# Patient Record
Sex: Female | Born: 1950 | Race: White | Hispanic: No | Marital: Single | Smoking: Never smoker
Health system: Southern US, Community
[De-identification: ages and names within clinical notes are randomized; demographics above are authoritative.]

## PROBLEM LIST (undated history)

## (undated) DIAGNOSIS — M169 Osteoarthritis of hip, unspecified: Secondary | ICD-10-CM

## (undated) DIAGNOSIS — Z96649 Presence of unspecified artificial hip joint: Secondary | ICD-10-CM

## (undated) DIAGNOSIS — R011 Cardiac murmur, unspecified: Secondary | ICD-10-CM

## (undated) DIAGNOSIS — E785 Hyperlipidemia, unspecified: Secondary | ICD-10-CM

## (undated) DIAGNOSIS — M8580 Other specified disorders of bone density and structure, unspecified site: Secondary | ICD-10-CM

## (undated) DIAGNOSIS — I10 Essential (primary) hypertension: Secondary | ICD-10-CM

## (undated) HISTORY — DX: Hyperlipidemia, unspecified: E78.5

## (undated) HISTORY — DX: Essential (primary) hypertension: I10

## (undated) HISTORY — PX: TONSILLECTOMY: SUR1361

## (undated) HISTORY — DX: Cardiac murmur, unspecified: R01.1

## (undated) HISTORY — DX: Osteoarthritis of hip, unspecified: M16.9

## (undated) HISTORY — DX: Presence of unspecified artificial hip joint: Z96.649

## (undated) HISTORY — DX: Other specified disorders of bone density and structure, unspecified site: M85.80

## (undated) HISTORY — PX: JOINT REPLACEMENT: SHX530

---

## 2014-04-22 ENCOUNTER — Ambulatory Visit (INDEPENDENT_AMBULATORY_CARE_PROVIDER_SITE_OTHER): Payer: BC Managed Care – PPO | Admitting: Family Medicine

## 2014-04-22 ENCOUNTER — Encounter: Payer: Self-pay | Admitting: Family Medicine

## 2014-04-22 VITALS — BP 149/88 | HR 95 | Ht 63.0 in | Wt 136.0 lb

## 2014-04-22 DIAGNOSIS — Z8679 Personal history of other diseases of the circulatory system: Secondary | ICD-10-CM | POA: Insufficient documentation

## 2014-04-22 DIAGNOSIS — Z8639 Personal history of other endocrine, nutritional and metabolic disease: Secondary | ICD-10-CM

## 2014-04-22 DIAGNOSIS — I10 Essential (primary) hypertension: Secondary | ICD-10-CM

## 2014-04-22 DIAGNOSIS — Z862 Personal history of diseases of the blood and blood-forming organs and certain disorders involving the immune mechanism: Secondary | ICD-10-CM

## 2014-04-22 DIAGNOSIS — E785 Hyperlipidemia, unspecified: Secondary | ICD-10-CM

## 2014-04-22 DIAGNOSIS — Z1231 Encounter for screening mammogram for malignant neoplasm of breast: Secondary | ICD-10-CM

## 2014-04-22 DIAGNOSIS — R030 Elevated blood-pressure reading, without diagnosis of hypertension: Secondary | ICD-10-CM

## 2014-04-22 DIAGNOSIS — R918 Other nonspecific abnormal finding of lung field: Secondary | ICD-10-CM

## 2014-04-22 DIAGNOSIS — IMO0001 Reserved for inherently not codable concepts without codable children: Secondary | ICD-10-CM

## 2014-04-22 HISTORY — DX: Essential (primary) hypertension: I10

## 2014-04-22 HISTORY — DX: Hyperlipidemia, unspecified: E78.5

## 2014-04-22 NOTE — Patient Instructions (Signed)
DASH Diet  The DASH diet stands for "Dietary Approaches to Stop Hypertension." It is a healthy eating plan that has been shown to reduce high blood pressure (hypertension) in as little as 14 days, while also possibly providing other significant health benefits. These other health benefits include reducing the risk of breast cancer after menopause and reducing the risk of type 2 diabetes, heart disease, colon cancer, and stroke. Health benefits also include weight loss and slowing kidney failure in patients with chronic kidney disease.   DIET GUIDELINES  · Limit salt (sodium). Your diet should contain less than 1500 mg of sodium daily.  · Limit refined or processed carbohydrates. Your diet should include mostly whole grains. Desserts and added sugars should be used sparingly.  · Include small amounts of heart-healthy fats. These types of fats include nuts, oils, and tub margarine. Limit saturated and trans fats. These fats have been shown to be harmful in the body.  CHOOSING FOODS   The following food groups are based on a 2000 calorie diet. See your Registered Dietitian for individual calorie needs.  Grains and Grain Products (6 to 8 servings daily)  · Eat More Often: Whole-wheat bread, brown rice, whole-grain or wheat pasta, quinoa, popcorn without added fat or salt (air popped).  · Eat Less Often: White bread, white pasta, white rice, cornbread.  Vegetables (4 to 5 servings daily)  · Eat More Often: Fresh, frozen, and canned vegetables. Vegetables may be raw, steamed, roasted, or grilled with a minimal amount of fat.  · Eat Less Often/Avoid: Creamed or fried vegetables. Vegetables in a cheese sauce.  Fruit (4 to 5 servings daily)  · Eat More Often: All fresh, canned (in natural juice), or frozen fruits. Dried fruits without added sugar. One hundred percent fruit juice (½ cup [237 mL] daily).  · Eat Less Often: Dried fruits with added sugar. Canned fruit in light or heavy syrup.  Lean Meats, Fish, and Poultry (2  servings or less daily. One serving is 3 to 4 oz [85-114 g]).  · Eat More Often: Ninety percent or leaner ground beef, tenderloin, sirloin. Round cuts of beef, chicken breast, turkey breast. All fish. Grill, bake, or broil your meat. Nothing should be fried.  · Eat Less Often/Avoid: Fatty cuts of meat, turkey, or chicken leg, thigh, or wing. Fried cuts of meat or fish.  Dairy (2 to 3 servings)  · Eat More Often: Low-fat or fat-free milk, low-fat plain or light yogurt, reduced-fat or part-skim cheese.  · Eat Less Often/Avoid: Milk (whole, 2%). Whole milk yogurt. Full-fat cheeses.  Nuts, Seeds, and Legumes (4 to 5 servings per week)  · Eat More Often: All without added salt.  · Eat Less Often/Avoid: Salted nuts and seeds, canned beans with added salt.  Fats and Sweets (limited)  · Eat More Often: Vegetable oils, tub margarines without trans fats, sugar-free gelatin. Mayonnaise and salad dressings.  · Eat Less Often/Avoid: Coconut oils, palm oils, butter, stick margarine, cream, half and half, cookies, candy, pie.  FOR MORE INFORMATION  The Dash Diet Eating Plan: www.dashdiet.org  Document Released: 12/02/2011 Document Revised: 03/06/2012 Document Reviewed: 12/02/2011  ExitCare® Patient Information ©2014 [employer].

## 2014-04-22 NOTE — Progress Notes (Signed)
Subjective:    Patient ID: Tanya Cortez, female    DOB: [birth year], 63 y.o.   MRN: [id number]  HPI Here to establish care. She has noticed her blood pressures have been elevated over the last month. She's not currently taking any prescription medications. Does have a history of slightly elevated cholesterol levels and would like to have this rechecked. Has been retaining fluid some in her lower extremities. Worse on the right ankle.  Worse at the end of the day. No CP or SOB or palpitations. She currently walks 2 miles per day for about 30-45 minutes.  It has been several years since she had blood work or Pap smear. She has never had colon cancer screening. Her last mammogram was several years ago. She really has not seen a doctor in several years.  Review of Systems  Constitutional: Negative for fever, diaphoresis and unexpected weight change.  HENT: Negative for hearing loss, rhinorrhea and tinnitus.   Eyes: Negative for visual disturbance.  Respiratory: Negative for cough and wheezing.   Cardiovascular: Negative for chest pain and palpitations.  Gastrointestinal: Negative for nausea, vomiting, diarrhea and blood in stool.  Genitourinary: Negative for vaginal bleeding, vaginal discharge and difficulty urinating.  Musculoskeletal: Negative for arthralgias and myalgias.  Skin: Negative for rash.  Neurological: Negative for headaches.  Hematological: Negative for adenopathy. Does not bruise/bleed easily.  Psychiatric/Behavioral: Negative for sleep disturbance and dysphoric mood. The patient is not nervous/anxious.     BP 149/88  Pulse 95  Ht 5\' 3"  (1.6 m)  Wt 136 lb (61.689 kg)  BMI 24.10 kg/m2    No Known Allergies  No past medical history on file.  No past surgical history on file.  History   Social History  . Marital Status: Single    Spouse Name: N/A    Number of Children: N/A  . Years of Education: N/A   Occupational History  . high school principal     [employer]    Social History Main Topics  . Smoking status: Never Smoker   . Smokeless tobacco: Not on file  . Alcohol Use: No  . Drug Use: No  . Sexual Activity: No   Other Topics Concern  . Not on file   Social History Narrative   Patient reports exercising regularly by doing 30 -45 mins daily of cardio.     Family History  Problem Relation Age of Onset  . Skin cancer Mother   . Heart attack Maternal Grandfather   . Hypertension Mother   . Stroke Maternal Grandfather   . Heart attack Father     died 64 yo  . Breast cancer Mother   . Hypertension Sister     No outpatient encounter prescriptions on file as of 04/22/2014.          Objective:   Physical Exam  Constitutional: She is oriented to person, place, and time. She appears well-developed and well-nourished.  HENT:  Head: Normocephalic and atraumatic.  Neck: Neck supple. No thyromegaly present.  Cardiovascular: Normal rate, regular rhythm and normal heart sounds.   No carotid bruits  Pulmonary/Chest: Effort normal and breath sounds normal. No respiratory distress. She has no wheezes.  Diffuse prolonged expiration.  Neurological: She is alert and oriented to person, place, and time.  Skin: Skin is warm and dry.  Psychiatric: She has a normal mood and affect. Her behavior is normal.          Assessment & Plan:  Elevated BP - BP is  borderline elevated today. Discussed blood pressure goals based on her age. I would like to see her back in a few weeks to schedule her physical. We can recheck it at that time and evaluate whether not we need to start on medication. In the meantime discussed DASH diet and handout provided. She feels like overall she does a fantastic job with her diet and exercise.  Prolonged expiration-she does have a family history COPD. She's never been a smoker and no significant secondhand exposure. I recommend consider evaluation with spirometry sometime this summer.  Discussed need for mammogram. Order  placed.  Due for CMP and fasting lipid panel. Went ahead and give her a lab slip to do this we will call with the results once available and can review in detail at her followup appointment for her physical. She's also due for a Pap smear at that time.

## 2014-04-25 LAB — CBC
HCT: 40.9 % (ref 36.0–46.0)
Hemoglobin: 14.7 g/dL (ref 12.0–15.0)
MCH: 31.1 pg (ref 26.0–34.0)
MCHC: 35.9 g/dL (ref 30.0–36.0)
MCV: 86.5 fL (ref 78.0–100.0)
PLATELETS: 231 10*3/uL (ref 150–400)
RBC: 4.73 MIL/uL (ref 3.87–5.11)
RDW: 12.7 % (ref 11.5–15.5)
WBC: 5.5 10*3/uL (ref 4.0–10.5)

## 2014-04-25 LAB — COMPLETE METABOLIC PANEL WITH GFR
ALBUMIN: 4.6 g/dL (ref 3.5–5.2)
ALK PHOS: 91 U/L (ref 39–117)
ALT: 8 U/L (ref 0–35)
AST: 17 U/L (ref 0–37)
BUN: 13 mg/dL (ref 6–23)
CO2: 31 mEq/L (ref 19–32)
Calcium: 9.7 mg/dL (ref 8.4–10.5)
Chloride: 104 mEq/L (ref 96–112)
Creat: 0.7 mg/dL (ref 0.50–1.10)
GFR, Est African American: >89 mL/min
GFR, Est Non African American: >89 mL/min
GLUCOSE: 106 mg/dL — AB (ref 70–99)
POTASSIUM: 4.7 meq/L (ref 3.5–5.3)
SODIUM: 143 meq/L (ref 135–145)
TOTAL PROTEIN: 6.6 g/dL (ref 6.0–8.3)
Total Bilirubin: 0.5 mg/dL (ref 0.2–1.2)

## 2014-04-25 LAB — LIPID PANEL
CHOL/HDL RATIO: 3.6 ratio
Cholesterol: 276 mg/dL — ABNORMAL HIGH (ref 0–200)
HDL: 77 mg/dL (ref >39)
LDL CALC: 187 mg/dL — AB (ref 0–99)
Triglycerides: 62 mg/dL (ref <150)
VLDL: 12 mg/dL (ref 0–40)

## 2014-04-26 LAB — TSH: TSH: 2.3 u[IU]/mL (ref 0.350–4.500)

## 2014-05-06 ENCOUNTER — Encounter: Payer: BC Managed Care – PPO | Admitting: Physician Assistant

## 2014-05-08 ENCOUNTER — Other Ambulatory Visit (HOSPITAL_COMMUNITY)
Admission: RE | Admit: 2014-05-08 | Discharge: 2014-05-08 | Disposition: A | Discharge status: Home / Self Care | Payer: BC Managed Care – PPO | Source: Ambulatory Visit | Attending: Family Medicine | Admitting: Family Medicine

## 2014-05-08 ENCOUNTER — Encounter: Payer: Self-pay | Admitting: Family Medicine

## 2014-05-08 ENCOUNTER — Ambulatory Visit (INDEPENDENT_AMBULATORY_CARE_PROVIDER_SITE_OTHER): Payer: BC Managed Care – PPO | Admitting: Family Medicine

## 2014-05-08 VITALS — BP 149/87 | HR 79 | Wt 135.0 lb

## 2014-05-08 DIAGNOSIS — Z23 Encounter for immunization: Secondary | ICD-10-CM

## 2014-05-08 DIAGNOSIS — Z0000 Encounter for general adult medical examination without abnormal findings: Secondary | ICD-10-CM

## 2014-05-08 DIAGNOSIS — Z124 Encounter for screening for malignant neoplasm of cervix: Secondary | ICD-10-CM

## 2014-05-08 DIAGNOSIS — Z1151 Encounter for screening for human papillomavirus (HPV): Secondary | ICD-10-CM | POA: Insufficient documentation

## 2014-05-08 DIAGNOSIS — R7309 Other abnormal glucose: Secondary | ICD-10-CM

## 2014-05-08 DIAGNOSIS — Z01419 Encounter for gynecological examination (general) (routine) without abnormal findings: Secondary | ICD-10-CM | POA: Insufficient documentation

## 2014-05-08 DIAGNOSIS — R011 Cardiac murmur, unspecified: Secondary | ICD-10-CM

## 2014-05-08 DIAGNOSIS — I10 Essential (primary) hypertension: Secondary | ICD-10-CM

## 2014-05-08 LAB — POCT GLYCOSYLATED HEMOGLOBIN (HGB A1C): Hemoglobin A1C: 5.5

## 2014-05-08 MED ORDER — ZOSTER VACCINE LIVE 19400 UNT/0.65ML ~~LOC~~ SOLR: 0.6500 mL | Freq: Once | SUBCUTANEOUS | Status: DC

## 2014-05-08 MED ORDER — LISINOPRIL 5 MG PO TABS: 5.0000 mg | ORAL_TABLET | Freq: Every day | ORAL | Status: DC

## 2014-05-08 NOTE — Progress Notes (Signed)
  Subjective:     Tanya Cortez is a 63 y.o. female and is here for a comprehensive physical exam. The patient reports no problems.  History   Social History  . Marital Status: Single    Spouse Name: N/A    Number of Children: N/A  . Years of Education: N/A   Occupational History  . high school principal     [employer]   Social History Main Topics  . Smoking status: Never Smoker   . Smokeless tobacco: Not on file  . Alcohol Use: No  . Drug Use: No  . Sexual Activity: No   Other Topics Concern  . Not on file   Social History Narrative   Patient reports exercising regularly by doing 30 -45 mins daily of cardio. [employer].  She has a degree in education specialty.  Lives with Tawanna CoolerKathryn Miller   Health Maintenance  Topic Date Due  . Pap Smear  01/12/1968  . Tetanus/tdap  01/11/1969  . Mammogram  01/12/2000  . Colonoscopy  01/12/2000  . Zostavax  01/11/2010  . Influenza Vaccine  07/27/2014    The following portions of the patient's history were reviewed and updated as appropriate: allergies, current medications, past family history, past medical history, past social history, past surgical history and problem list.  Review of Systems A comprehensive review of systems was negative.   Objective:    BP 149/87  Pulse 79  Wt 135 lb (61.236 kg) General appearance: alert, cooperative and appears stated age Head: Normocephalic, without obvious abnormality, atraumatic Eyes: conj clear, EOMI, PEERLA Ears: normal TM's and external ear canals both ears Nose: Nares normal. Septum midline. Mucosa normal. No drainage or sinus tenderness. Throat: lips, mucosa, and tongue normal; teeth and gums normal Neck: no adenopathy, no carotid bruit, no JVD, supple, symmetrical, trachea midline and thyroid not enlarged, symmetric, no tenderness/mass/nodules Back: symmetric, no curvature. ROM normal. No CVA tenderness. Lungs: clear to auscultation bilaterally Breasts: normal appearance,  no masses or tenderness Heart: systolic murmur: holosystolic 1/6, prolonged at lower left sternal border Abdomen: soft, non-tender; bowel sounds normal; no masses,  no organomegaly Pelvic: cervix normal in appearance, external genitalia normal, no adnexal masses or tenderness, no cervical motion tenderness, rectovaginal septum normal, uterus normal size, shape, and consistency, vagina normal without discharge and vaginal atrophy Extremities: extremities normal, atraumatic, no cyanosis or edema Pulses: 2+ and symmetric Skin: Skin color, texture, turgor normal. No rashes or lesions Lymph nodes: Cervical, supraclavicular, and axillary nodes normal. Neurologic: Alert and oriented X 3, normal strength and tone. Normal symmetric reflexes. Normal coordination and gait    Assessment:    Healthy female exam.      Plan:     See After Visit Summary for Counseling Recommendations  Keep up a regular exercise program and make sure you are eating a healthy diet Try to eat 4 servings of dairy a day, or if you are lactose intolerant take a calcium with vitamin D daily.  Your vaccines are up to date.   tdap given today Shingles vaccine given today.  Reviewed labs with her.   Abnormal glucose-hemoglobin A1c performed today. Lab Results  Component Value Date   HGBA1C 5.5 05/08/2014   Hypertension-we'll start lisinopril. She really eats very healthy and has a fantastic BMI. She exercises regularly. I think that her high blood pressure somewhat genetic. Will start medication. Discussed potential side effects of ACE inhibitors. Followup in 6-8 weeks.

## 2014-05-08 NOTE — Patient Instructions (Addendum)
We will call you in about a week that your Pap smear results. He did receive the tdap vaccine in the shingles vaccine today. If you have any questions about her blood work then please let us know.  Keep up a regular exercise program and make sure you are eating a healthy diet Try to eat 4 servings of dairy a day, or if you are lactose intolerant take a calcium with vitamin D daily.  Your vaccines are up to date.

## 2014-05-10 ENCOUNTER — Telehealth: Payer: Self-pay | Admitting: *Deleted

## 2014-05-10 NOTE — Progress Notes (Signed)
Quick Note:  Call patient: Your Pap smear is normal. Repeat in 3 years. ______ 

## 2014-05-10 NOTE — Telephone Encounter (Signed)
PA obtained for a resting 2d echo.  Auth # [id number].  Meyer CoryMisty Ahmad, LPN

## 2014-05-22 ENCOUNTER — Ambulatory Visit (HOSPITAL_BASED_OUTPATIENT_CLINIC_OR_DEPARTMENT_OTHER)
Admission: RE | Admit: 2014-05-22 | Discharge: 2014-05-22 | Disposition: A | Discharge status: Home / Self Care | Payer: BC Managed Care – PPO | Source: Ambulatory Visit | Attending: Family Medicine | Admitting: Family Medicine

## 2014-05-22 DIAGNOSIS — R7309 Other abnormal glucose: Secondary | ICD-10-CM

## 2014-05-22 DIAGNOSIS — R011 Cardiac murmur, unspecified: Secondary | ICD-10-CM | POA: Insufficient documentation

## 2014-05-22 DIAGNOSIS — I10 Essential (primary) hypertension: Secondary | ICD-10-CM | POA: Insufficient documentation

## 2014-05-22 DIAGNOSIS — Z23 Encounter for immunization: Secondary | ICD-10-CM

## 2014-05-22 DIAGNOSIS — E785 Hyperlipidemia, unspecified: Secondary | ICD-10-CM | POA: Insufficient documentation

## 2014-05-22 DIAGNOSIS — Z0000 Encounter for general adult medical examination without abnormal findings: Secondary | ICD-10-CM

## 2014-05-22 DIAGNOSIS — Z124 Encounter for screening for malignant neoplasm of cervix: Secondary | ICD-10-CM

## 2014-05-22 NOTE — Progress Notes (Signed)
  Echocardiogram 2D Echocardiogram has been performed.  Tanya Cortez 05/22/2014, 9:10 AM

## 2014-05-24 ENCOUNTER — Encounter: Payer: Self-pay | Admitting: Family Medicine

## 2014-05-24 DIAGNOSIS — R011 Cardiac murmur, unspecified: Secondary | ICD-10-CM | POA: Insufficient documentation

## 2014-06-19 ENCOUNTER — Ambulatory Visit (INDEPENDENT_AMBULATORY_CARE_PROVIDER_SITE_OTHER): Payer: BC Managed Care – PPO | Admitting: Family Medicine

## 2014-06-19 ENCOUNTER — Encounter: Payer: Self-pay | Admitting: Family Medicine

## 2014-06-19 VITALS — BP 118/82 | HR 74 | Wt 134.0 lb

## 2014-06-19 DIAGNOSIS — Z1211 Encounter for screening for malignant neoplasm of colon: Secondary | ICD-10-CM

## 2014-06-19 DIAGNOSIS — I10 Essential (primary) hypertension: Secondary | ICD-10-CM

## 2014-06-19 NOTE — Progress Notes (Signed)
   Subjective:    Patient ID: Tanya Cortez, female    DOB: 06/29/1951, 63 y.o.   MRN: [id number]  HPI Hypertension- Pt denies chest pain, SOB, dizziness, or heart palpitations.  Taking meds as directed w/o problems.  Denies medication side effects.  We started lisinopril the last office visit. She's not had any side effects with the new medication.  Review of Systems     Objective:   Physical Exam  Constitutional: She is oriented to person, place, and time. She appears well-developed and well-nourished.  HENT:  Head: Normocephalic and atraumatic.  Cardiovascular: Normal rate, regular rhythm and normal heart sounds.   Pulmonary/Chest: Effort normal and breath sounds normal.  Neurological: She is alert and oriented to person, place, and time.  Skin: Skin is warm and dry.  Psychiatric: She has a normal mood and affect. Her behavior is normal.          Assessment & Plan:  HTN - Well controlled on new regimen without any side effects of the medication. Check BMP today. Followup in 6 months.  Due for screening colonoscopy. Will place referral today.    Unfortunately she never received a phone call about her mammogram. We call today to get her an appointment in July.

## 2014-06-20 LAB — BASIC METABOLIC PANEL WITH GFR
BUN: 13 mg/dL (ref 6–23)
CHLORIDE: 100 meq/L (ref 96–112)
CO2: 31 mEq/L (ref 19–32)
Calcium: 9.7 mg/dL (ref 8.4–10.5)
Creat: 0.74 mg/dL (ref 0.50–1.10)
GFR, EST NON AFRICAN AMERICAN: 86 mL/min
GFR, Est African American: >89 mL/min
GLUCOSE: 78 mg/dL (ref 70–99)
Potassium: 4.1 mEq/L (ref 3.5–5.3)
Sodium: 138 mEq/L (ref 135–145)

## 2014-06-20 NOTE — Progress Notes (Signed)
Quick Note:  All labs are normal. ______ 

## 2014-07-02 ENCOUNTER — Ambulatory Visit: Payer: BC Managed Care – PPO

## 2014-07-16 ENCOUNTER — Ambulatory Visit (INDEPENDENT_AMBULATORY_CARE_PROVIDER_SITE_OTHER): Payer: BC Managed Care – PPO

## 2014-07-16 DIAGNOSIS — Z1231 Encounter for screening mammogram for malignant neoplasm of breast: Secondary | ICD-10-CM

## 2014-12-17 ENCOUNTER — Ambulatory Visit (INDEPENDENT_AMBULATORY_CARE_PROVIDER_SITE_OTHER): Payer: BC Managed Care – PPO | Admitting: Family Medicine

## 2014-12-17 ENCOUNTER — Encounter: Payer: Self-pay | Admitting: Family Medicine

## 2014-12-17 VITALS — BP 118/78 | HR 73 | Wt 136.0 lb

## 2014-12-17 DIAGNOSIS — Z23 Encounter for immunization: Secondary | ICD-10-CM | POA: Diagnosis not present

## 2014-12-17 DIAGNOSIS — I10 Essential (primary) hypertension: Secondary | ICD-10-CM | POA: Diagnosis not present

## 2014-12-17 NOTE — Progress Notes (Signed)
   Subjective:    Patient ID: Tanya Cortez, female    DOB: [birth year], 63 y.o.   MRN: [id number]  HPI  Hypertension- Pt denies chest pain, SOB, dizziness, or heart palpitations.  Taking meds as directed w/o problems.  Denies medication side effects.     Review of Systems     Objective:   Physical Exam  Constitutional: She is oriented to person, place, and time. She appears well-developed and well-nourished.  HENT:  Head: Normocephalic and atraumatic.  Cardiovascular: Normal rate, regular rhythm and normal heart sounds.   Pulmonary/Chest: Effort normal and breath sounds normal.  Neurological: She is alert and oriented to person, place, and time.  Skin: Skin is warm and dry.  Psychiatric: She has a normal mood and affect. Her behavior is normal.          Assessment & Plan:  HTN- well controlled. Due for lipoids in April. Continue current regimen. Follow up in 6 months.   She is due for screening colonoscopy but switch jobs. She will have her new insurance kick in soon. So she will call me after the new year so that we can schedule her.

## 2014-12-18 ENCOUNTER — Ambulatory Visit: Payer: BC Managed Care – PPO | Admitting: Family Medicine

## 2015-05-10 ENCOUNTER — Other Ambulatory Visit: Payer: Self-pay | Admitting: Family Medicine

## 2015-06-12 ENCOUNTER — Encounter: Payer: Self-pay | Admitting: Family Medicine

## 2015-06-12 ENCOUNTER — Ambulatory Visit (INDEPENDENT_AMBULATORY_CARE_PROVIDER_SITE_OTHER): Payer: BC Managed Care – PPO | Admitting: Family Medicine

## 2015-06-12 VITALS — BP 130/82 | HR 67 | Ht 63.0 in | Wt 132.0 lb

## 2015-06-12 DIAGNOSIS — M25561 Pain in right knee: Secondary | ICD-10-CM

## 2015-06-12 DIAGNOSIS — I10 Essential (primary) hypertension: Secondary | ICD-10-CM

## 2015-06-12 DIAGNOSIS — Z23 Encounter for immunization: Secondary | ICD-10-CM | POA: Diagnosis not present

## 2015-06-12 DIAGNOSIS — M2550 Pain in unspecified joint: Secondary | ICD-10-CM

## 2015-06-12 DIAGNOSIS — Z8639 Personal history of other endocrine, nutritional and metabolic disease: Secondary | ICD-10-CM

## 2015-06-12 DIAGNOSIS — M25552 Pain in left hip: Secondary | ICD-10-CM

## 2015-06-12 DIAGNOSIS — M25551 Pain in right hip: Secondary | ICD-10-CM

## 2015-06-12 DIAGNOSIS — M25562 Pain in left knee: Secondary | ICD-10-CM

## 2015-06-12 LAB — COMPLETE METABOLIC PANEL WITH GFR
ALK PHOS: 95 U/L (ref 39–117)
ALT: 9 U/L (ref 0–35)
AST: 20 U/L (ref 0–37)
Albumin: 4.7 g/dL (ref 3.5–5.2)
BUN: 14 mg/dL (ref 6–23)
CALCIUM: 9.5 mg/dL (ref 8.4–10.5)
CHLORIDE: 103 meq/L (ref 96–112)
CO2: 30 mEq/L (ref 19–32)
Creat: 0.72 mg/dL (ref 0.50–1.10)
GFR, EST NON AFRICAN AMERICAN: 88 mL/min
GFR, Est African American: >89 mL/min
GLUCOSE: 86 mg/dL (ref 70–99)
POTASSIUM: 4.7 meq/L (ref 3.5–5.3)
Sodium: 143 mEq/L (ref 135–145)
Total Bilirubin: 0.5 mg/dL (ref 0.2–1.2)
Total Protein: 6.9 g/dL (ref 6.0–8.3)

## 2015-06-12 LAB — LIPID PANEL
Cholesterol: 279 mg/dL — ABNORMAL HIGH (ref 0–200)
HDL: 94 mg/dL (ref >=46)
LDL Cholesterol: 174 mg/dL — ABNORMAL HIGH (ref 0–99)
TRIGLYCERIDES: 56 mg/dL (ref <150)
Total CHOL/HDL Ratio: 3 Ratio
VLDL: 11 mg/dL (ref 0–40)

## 2015-06-12 LAB — C-REACTIVE PROTEIN: CRP: <0.5 mg/dL (ref <0.60)

## 2015-06-12 MED ORDER — LOSARTAN POTASSIUM 25 MG PO TABS: 25.0000 mg | ORAL_TABLET | Freq: Every day | ORAL | Status: DC

## 2015-06-12 NOTE — Progress Notes (Signed)
   Subjective:    Patient ID: Tanya Cortez, female    DOB: [birth year], 64 y.o.   MRN: [id number]  HPI   Hypertension- Pt denies chest pain, SOB, dizziness, or heart palpitations.  Taking meds as directed w/o problems.  Denies medication side effects. Regular exercise.     Having knee and hip pain for several months.  Says exercise can make it worse at times.  No pain in her hands.  No no swelling or redness of the joints. No family history of rheumatoid. Left shoulder has been popping but not painful. occ sore when lifts her left shoulder.  painin knees and hips is more noticeable when stands up after has been sitting for awhile and then gets better as she moves.  The pain in the hips is a little bit posterior toward the back area just lateral to the trochanteric bursa. Knee pain is just generalized.   Review of Systems     Objective:   Physical Exam  Constitutional: She is oriented to person, place, and time. She appears well-developed and well-nourished.  HENT:  Head: Normocephalic and atraumatic.  Cardiovascular: Normal rate, regular rhythm and normal heart sounds.   Pulmonary/Chest: Effort normal and breath sounds normal.  Musculoskeletal:  Hips with normal range of motion. Hip, knee, ankle strength is 5 out of 5 bilaterally. No pain at the hips with internal or external rotation. Non-tender over the trochanteric bursa as. Knees with normal range of motion. No significant crepitus. Nontender along the medial or lateral joint lines. No swelling or edema.  Neurological: She is alert and oriented to person, place, and time.  Skin: Skin is warm and dry.  Psychiatric: She has a normal mood and affect. Her behavior is normal.          Assessment & Plan:  HTN - well controlled. Continue current regimen. Follow-up in 6 months.  Bilateral hip/bilateral knee pain-Based on her location of pain is unlikely to be rheumatoid but she would like to be tested with blood work. I did go ahead  and order those labs today. Discussed it may just be more osteoarthritis. Based on her knee symptoms it sounds a little bit like patellofemoral syndrome/chondromalacia patella. Handout given on stretches and exercises to do on her own at home. Hip exam was completely benign today so I'm not clear what might be causing that it could be radiating from her back.  Discussed need for colonscopy.    Prevnar 13 given today.

## 2015-06-13 LAB — SEDIMENTATION RATE: Sed Rate: 5 mm/hr (ref 0–30)

## 2015-06-13 LAB — CYCLIC CITRUL PEPTIDE ANTIBODY, IGG: Cyclic Citrullin Peptide Ab: <2 U/mL (ref 0.0–5.0)

## 2015-06-13 LAB — ANA: ANA: NEGATIVE

## 2015-06-18 ENCOUNTER — Ambulatory Visit: Payer: BC Managed Care – PPO | Admitting: Family Medicine

## 2015-07-17 ENCOUNTER — Other Ambulatory Visit: Payer: Self-pay | Admitting: Family Medicine

## 2015-07-17 DIAGNOSIS — Z1231 Encounter for screening mammogram for malignant neoplasm of breast: Secondary | ICD-10-CM

## 2015-07-31 ENCOUNTER — Ambulatory Visit (INDEPENDENT_AMBULATORY_CARE_PROVIDER_SITE_OTHER): Payer: BC Managed Care – PPO

## 2015-07-31 DIAGNOSIS — Z1231 Encounter for screening mammogram for malignant neoplasm of breast: Secondary | ICD-10-CM

## 2015-12-12 ENCOUNTER — Ambulatory Visit: Payer: BC Managed Care – PPO | Admitting: Family Medicine

## 2015-12-15 ENCOUNTER — Ambulatory Visit (INDEPENDENT_AMBULATORY_CARE_PROVIDER_SITE_OTHER): Payer: BC Managed Care – PPO | Admitting: Family Medicine

## 2015-12-15 ENCOUNTER — Encounter: Payer: Self-pay | Admitting: Family Medicine

## 2015-12-15 VITALS — BP 134/68 | HR 72 | Wt 133.0 lb

## 2015-12-15 DIAGNOSIS — I10 Essential (primary) hypertension: Secondary | ICD-10-CM | POA: Diagnosis not present

## 2015-12-15 DIAGNOSIS — Z1211 Encounter for screening for malignant neoplasm of colon: Secondary | ICD-10-CM

## 2015-12-15 DIAGNOSIS — Z23 Encounter for immunization: Secondary | ICD-10-CM | POA: Diagnosis not present

## 2015-12-15 MED ORDER — LOSARTAN POTASSIUM 25 MG PO TABS: 25.0000 mg | ORAL_TABLET | Freq: Every day | ORAL | Status: DC

## 2015-12-15 NOTE — Progress Notes (Signed)
   Subjective:    Patient ID: Tanya Cortez, female    DOB: [birth year], 64 y.o.   MRN: [id number]  HPI Hypertension- Pt denies chest pain, SOB, dizziness, or heart palpitations.  Taking meds as directed w/o problems.  Denies medication side effects.    Colon cancer Screening.  Wants to discuss options.   Review of Systems     Objective:   Physical Exam  Constitutional: She is oriented to person, place, and time. She appears well-developed and well-nourished.  HENT:  Head: Normocephalic and atraumatic.  Cardiovascular: Normal rate, regular rhythm and normal heart sounds.   Pulmonary/Chest: Effort normal and breath sounds normal.  Neurological: She is alert and oriented to person, place, and time.  Skin: Skin is warm and dry.  Psychiatric: She has a normal mood and affect. Her behavior is normal.          Assessment & Plan:  HTN - well controlled. F/U oin 6 months. Due for BMP.   Colon cancer screen - discussed cologuard.  Given information. Will fax form to complete. Prefers to wait until January 1.

## 2015-12-16 LAB — BASIC METABOLIC PANEL WITH GFR
BUN: 16 mg/dL (ref 7–25)
CHLORIDE: 104 mmol/L (ref 98–110)
CO2: 29 mmol/L (ref 20–31)
Calcium: 9.6 mg/dL (ref 8.6–10.4)
Creat: 0.82 mg/dL (ref 0.50–0.99)
GFR, EST NON AFRICAN AMERICAN: 75 mL/min (ref >=60)
GFR, Est African American: 87 mL/min (ref >=60)
Glucose, Bld: 84 mg/dL (ref 65–99)
POTASSIUM: 4.9 mmol/L (ref 3.5–5.3)
SODIUM: 143 mmol/L (ref 135–146)

## 2015-12-16 NOTE — Progress Notes (Signed)
Quick Note:  All labs are normal. ______ 

## 2016-01-05 LAB — COLOGUARD: Cologuard: NEGATIVE

## 2016-01-14 ENCOUNTER — Telehealth: Payer: Self-pay | Admitting: Family Medicine

## 2016-01-14 ENCOUNTER — Other Ambulatory Visit: Payer: Self-pay | Admitting: Family Medicine

## 2016-01-14 NOTE — Telephone Encounter (Signed)
Call patient: Cologuard is negative. Repeat colon cancer screening recommended in 3 years.

## 2016-01-14 NOTE — Telephone Encounter (Signed)
Left information on Pt's VM, callback provided for any questions.  

## 2016-02-24 ENCOUNTER — Ambulatory Visit (INDEPENDENT_AMBULATORY_CARE_PROVIDER_SITE_OTHER): Payer: BC Managed Care – PPO | Admitting: Family Medicine

## 2016-02-24 ENCOUNTER — Encounter: Payer: Self-pay | Admitting: Family Medicine

## 2016-02-24 VITALS — BP 109/94 | HR 92 | Temp 98.9°F | Wt 135.0 lb

## 2016-02-24 DIAGNOSIS — J069 Acute upper respiratory infection, unspecified: Secondary | ICD-10-CM | POA: Diagnosis not present

## 2016-02-24 DIAGNOSIS — B9789 Other viral agents as the cause of diseases classified elsewhere: Principal | ICD-10-CM

## 2016-02-24 MED ORDER — IPRATROPIUM BROMIDE 0.06 % NA SOLN: 2.0000 | NASAL | Status: DC | PRN

## 2016-02-24 MED ORDER — BENZONATATE 200 MG PO CAPS: 200.0000 mg | ORAL_CAPSULE | Freq: Three times a day (TID) | ORAL | Status: DC | PRN

## 2016-02-24 NOTE — Patient Instructions (Signed)
Thank you for coming in today. Call or go to the emergency room if you get worse, have trouble breathing, have chest pains, or palpitations.   Cough, Adult Coughing is a reflex that clears your throat and your airways. Coughing helps to heal and protect your lungs. It is normal to cough occasionally, but a cough that happens with other symptoms or lasts a long time may be a sign of a condition that needs treatment. A cough may last only 2-3 weeks (acute), or it may last longer than 8 weeks (chronic). CAUSES Coughing is commonly caused by:  Breathing in substances that irritate your lungs.  A viral or bacterial respiratory infection.  Allergies.  Asthma.  Postnasal drip.  Smoking.  Acid backing up from the stomach into the esophagus (gastroesophageal reflux).  Certain medicines.  Chronic lung problems, including COPD (or rarely, lung cancer).  Other medical conditions such as heart failure. HOME CARE INSTRUCTIONS  Pay attention to any changes in your symptoms. Take these actions to help with your discomfort:  Take medicines only as told by your health care provider.  If you were prescribed an antibiotic medicine, take it as told by your health care provider. Do not stop taking the antibiotic even if you start to feel better.  Talk with your health care provider before you take a cough suppressant medicine.  Drink enough fluid to keep your urine clear or pale yellow.  If the air is dry, use a cold steam vaporizer or humidifier in your bedroom or your home to help loosen secretions.  Avoid anything that causes you to cough at work or at home.  If your cough is worse at night, try sleeping in a semi-upright position.  Avoid cigarette smoke. If you smoke, quit smoking. If you need help quitting, ask your health care provider.  Avoid caffeine.  Avoid alcohol.  Rest as needed. SEEK MEDICAL CARE IF:   You have new symptoms.  You cough up pus.  Your cough does not get  better after 2-3 weeks, or your cough gets worse.  You cannot control your cough with suppressant medicines and you are losing sleep.  You develop pain that is getting worse or pain that is not controlled with pain medicines.  You have a fever.  You have unexplained weight loss.  You have night sweats. SEEK IMMEDIATE MEDICAL CARE IF:  You cough up blood.  You have difficulty breathing.  Your heartbeat is very fast.   This information is not intended to replace advice given to you by your health care provider. Make sure you discuss any questions you have with your health care provider.   Document Released: 06/11/2011 Document Revised: 09/03/2015 Document Reviewed: 02/19/2015 Elsevier Interactive Patient Education 2016 Elsevier Inc.  

## 2016-02-24 NOTE — Assessment & Plan Note (Signed)
Viral URI. Treatment with Tylenol Tessalon and Atrovent nasal spray. Follow-up with PCP.

## 2016-02-24 NOTE — Progress Notes (Signed)
       Tanya Cortez is a 65 y.o. female who presents to [facility] today for cough congestion and runny nose. Symptoms present for 2 days. Patient notes a mild fever to 100.54F. She has tried Tylenol which does help. The cough is nonproductive. She denies any wheezing or chest tightness. 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 No vomiting diarrhea chest pain palpitations. Cough does not interfere with sleep.   Patient declines testing for bone density at this time preferring to wait until her next appointment with her PCP.  No past medical history on file. Past Surgical History  Procedure Laterality Date  . Tonsillectomy     Social History  Substance Use Topics  . Smoking status: Never Smoker   . Smokeless tobacco: Not on file  . Alcohol Use: No   family history includes Breast cancer in her mother; COPD in her mother; Heart attack in her father and maternal grandfather; Hypertension in her mother and sister; Skin cancer in her mother; Stroke in her maternal grandfather.  ROS as above Medications: Current Outpatient Prescriptions  Medication Sig Dispense Refill  . losartan (COZAAR) 25 MG tablet Take 1 tablet (25 mg total) by mouth daily. 90 tablet 1  . benzonatate (TESSALON) 200 MG capsule Take 1 capsule (200 mg total) by mouth 3 (three) times daily as needed for cough. 45 capsule 0  . ipratropium (ATROVENT) 0.06 % nasal spray Place 2 sprays into both nostrils every 4 (four) hours as needed for rhinitis. 10 mL 6   No current facility-administered medications for this visit.   No Known Allergies   Exam:  BP 109/94 mmHg  Pulse 92  Temp(Src) 98.9 F (37.2 C)  Wt 135 lb (61.236 kg)  SpO2 100% Gen: Well NAD nontoxic appearing HEENT: EOMI,  MMM clear nasal discharge. Normal tympanic membranes bilaterally. Posterior pharynx with  cobblestoning. Lungs: Normal work of breathing. CTABL Heart: RRR no MRG Abd: NABS, Soft. Nondistended, Nontender Exts: Brisk capillary refill, warm and well perfused.   No results found for this or any previous visit (from the past 24 hour(s)). No results found.   Please see individual assessment and plan sections.

## 2016-02-26 ENCOUNTER — Telehealth: Payer: Self-pay

## 2016-02-26 ENCOUNTER — Telehealth: Payer: Self-pay | Admitting: Family Medicine

## 2016-02-26 MED ORDER — LEVOFLOXACIN 500 MG PO TABS: 500.0000 mg | ORAL_TABLET | Freq: Every day | ORAL | Status: DC

## 2016-02-26 NOTE — Telephone Encounter (Signed)
Tanya Cortez now has a productive cough with brown sputum. She continues to have fevers up to 101.6. She also complains of congestion and sore throat. Please advise.

## 2016-02-26 NOTE — Telephone Encounter (Signed)
This is concerning for pneumonia. I called in levaquin antibiotic. Return to clinic tomorrow or sooner if needed.

## 2016-02-26 NOTE — Telephone Encounter (Signed)
Pt called.She said nobody had returned her call. I told her that Dr had already called in an antibiotic for her.

## 2016-02-26 NOTE — Telephone Encounter (Signed)
Patient advised and scheduled.  

## 2016-02-27 ENCOUNTER — Ambulatory Visit (INDEPENDENT_AMBULATORY_CARE_PROVIDER_SITE_OTHER): Payer: BC Managed Care – PPO

## 2016-02-27 ENCOUNTER — Encounter: Payer: Self-pay | Admitting: Family Medicine

## 2016-02-27 ENCOUNTER — Ambulatory Visit (INDEPENDENT_AMBULATORY_CARE_PROVIDER_SITE_OTHER): Payer: BC Managed Care – PPO | Admitting: Family Medicine

## 2016-02-27 VITALS — BP 100/61 | HR 82 | Temp 98.0°F | Ht 63.0 in | Wt 132.0 lb

## 2016-02-27 DIAGNOSIS — R05 Cough: Secondary | ICD-10-CM | POA: Diagnosis not present

## 2016-02-27 DIAGNOSIS — R509 Fever, unspecified: Secondary | ICD-10-CM

## 2016-02-27 DIAGNOSIS — J189 Pneumonia, unspecified organism: Secondary | ICD-10-CM | POA: Diagnosis not present

## 2016-02-27 LAB — CBC
HEMATOCRIT: 41.9 % (ref 36.0–46.0)
Hemoglobin: 14.4 g/dL (ref 12.0–15.0)
MCH: 30.7 pg (ref 26.0–34.0)
MCHC: 34.4 g/dL (ref 30.0–36.0)
MCV: 89.3 fL (ref 78.0–100.0)
MPV: 11.3 fL (ref 8.6–12.4)
PLATELETS: 215 10*3/uL (ref 150–400)
RBC: 4.69 MIL/uL (ref 3.87–5.11)
RDW: 12.9 % (ref 11.5–15.5)
WBC: 6.7 10*3/uL (ref 4.0–10.5)

## 2016-02-27 LAB — COMPREHENSIVE METABOLIC PANEL
ALT: 15 U/L (ref 6–29)
AST: 30 U/L (ref 10–35)
Albumin: 4 g/dL (ref 3.6–5.1)
Alkaline Phosphatase: 110 U/L (ref 33–130)
BUN: 16 mg/dL (ref 7–25)
CALCIUM: 9.1 mg/dL (ref 8.6–10.4)
CO2: 27 mmol/L (ref 20–31)
CREATININE: 0.96 mg/dL (ref 0.50–0.99)
Chloride: 96 mmol/L — ABNORMAL LOW (ref 98–110)
GLUCOSE: 151 mg/dL — AB (ref 65–99)
Potassium: 4.2 mmol/L (ref 3.5–5.3)
SODIUM: 137 mmol/L (ref 135–146)
Total Bilirubin: 0.4 mg/dL (ref 0.2–1.2)
Total Protein: 6.5 g/dL (ref 6.1–8.1)

## 2016-02-27 NOTE — Patient Instructions (Addendum)
It looks like you have pneumonia.  We are waiting on the radiologist to read the xray.  Continue Levaquin.  Return Monday if not better. Go to the ER or urgent care if worse.  Call or go to the emergency room if you get worse, have trouble breathing, have chest pains, or palpitations.  You will need repeat xray in 4 weeks.   Community-Acquired Pneumonia, Adult Pneumonia is an infection of the lungs. There are different types of pneumonia. One type can develop while a person is in a hospital. A different type, called community-acquired pneumonia, develops in people who are not, or have not recently been, in the hospital or other health care facility.  CAUSES Pneumonia may be caused by bacteria, viruses, or funguses. Community-acquired pneumonia is often caused by Streptococcus pneumonia bacteria. These bacteria are often passed from one person to another by breathing in droplets from the cough or sneeze of an infected person. RISK FACTORS The condition is more likely to develop in:  People who havechronic diseases, such as chronic obstructive pulmonary disease (COPD), asthma, congestive heart failure, cystic fibrosis, diabetes, or kidney disease.  People who haveearly-stage or late-stage HIV.  People who havesickle cell disease.  People who havehad their spleen removed (splenectomy).  People who havepoor Administrator.  People who havemedical conditions that increase the risk of breathing in (aspirating) secretions their own mouth and nose.   People who havea weakened immune system (immunocompromised).  People who smoke.  People whotravel to areas where pneumonia-causing germs commonly exist.  People whoare around animal habitats or animals that have pneumonia-causing germs, including birds, bats, rabbits, cats, and farm animals. SYMPTOMS Symptoms of this condition include:  Adry cough.  A wet (productive) cough.  Fever.  Sweating.  Chest pain, especially when  breathing deeply or coughing.  Rapid breathing or difficulty breathing.  Shortness of breath.  Shaking chills.  Fatigue.  Muscle aches. DIAGNOSIS Your health care provider will take a medical history and perform a physical exam. You may also have other tests, including:  Imaging studies of your chest, including X-rays.  Tests to check your blood oxygen level and other blood gases.  Other tests on blood, mucus (sputum), fluid around your lungs (pleural fluid), and urine. If your pneumonia is severe, other tests may be done to identify the specific cause of your illness. TREATMENT The type of treatment that you receive depends on many factors, such as the cause of your pneumonia, the medicines you take, and other medical conditions that you have. For most adults, treatment and recovery from pneumonia may occur at home. In some cases, treatment must happen in a hospital. Treatment may include:  Antibiotic medicines, if the pneumonia was caused by bacteria.  Antiviral medicines, if the pneumonia was caused by a virus.  Medicines that are given by mouth or through an IV tube.  Oxygen.  Respiratory therapy. Although rare, treating severe pneumonia may include:  Mechanical ventilation. This is done if you are not breathing well on your own and you cannot maintain a safe blood oxygen level.  Thoracentesis. This procedureremoves fluid around one lung or both lungs to help you breathe better. HOME CARE INSTRUCTIONS  Take over-the-counter and prescription medicines only as told by your health care provider.  Only takecough medicine if you are losing sleep. Understand that cough medicine can prevent your body's natural ability to remove mucus from your lungs.  If you were prescribed an antibiotic medicine, take it as told by your health care  provider. Do not stop taking the antibiotic even if you start to feel better.  Sleep in a semi-upright position at night. Try sleeping in a  reclining chair, or place a few pillows under your head.  Do not use tobacco products, including cigarettes, chewing tobacco, and e-cigarettes. If you need help quitting, ask your health care provider.  Drink enough water to keep your urine clear or pale yellow. This will help to thin out mucus secretions in your lungs. PREVENTION There are ways that you can decrease your risk of developing community-acquired pneumonia. Consider getting a pneumococcal vaccine if:  You are older than 66 years of age.  You are older than 66 years of age and are undergoing cancer treatment, have chronic lung disease, or have other medical conditions that affect your immune system. Ask your health care provider if this applies to you. There are different types and schedules of pneumococcal vaccines. Ask your health care provider which vaccination option is best for you. You may also prevent community-acquired pneumonia if you take these actions:  Get an influenza vaccine every year. Ask your health care provider which type of influenza vaccine is best for you.  Go to the dentist on a regular basis.  Wash your hands often. Use hand sanitizer if soap and water are not available. SEEK MEDICAL CARE IF:  You have a fever.  You are losing sleep because you cannot control your cough with cough medicine. SEEK IMMEDIATE MEDICAL CARE IF:  You have worsening shortness of breath.  You have increased chest pain.  Your sickness becomes worse, especially if you are an older adult or have a weakened immune system.  You cough up blood.   This information is not intended to replace advice given to you by your health care provider. Make sure you discuss any questions you have with your health care provider.   Document Released: 12/13/2005 Document Revised: 09/03/2015 Document Reviewed: 04/09/2015 Elsevier Interactive Patient Education [employer]2016 [employer] Inc.

## 2016-02-27 NOTE — Progress Notes (Signed)
       Tanya Cortez is a 65 y.o. female [facility] today for cough and congestion. Patient was seen on the 28th for cough for she was thought to have a viral URI. She was treated with Tessalon Perles Atrovent nasal spray. She notes she worsened and developed a fever and a productive cough read she called yesterday complaining of the symptoms. Levaquin was sent to her pharmacy and she was a feisty follow-up today for recheck and reevaluation. She took the levaquin yesterday and is already feeling better.    No past medical history on file. Past Surgical History  Procedure Laterality Date  . Tonsillectomy     Social History  Substance Use Topics  . Smoking status: Never Smoker   . Smokeless tobacco: Not on file  . Alcohol Use: No   family history includes Breast cancer in her mother; COPD in her mother; Heart attack in her father and maternal grandfather; Hypertension in her mother and sister; Skin cancer in her mother; Stroke in her maternal grandfather.  ROS as above Medications: Current Outpatient Prescriptions  Medication Sig Dispense Refill  . benzonatate (TESSALON) 200 MG capsule Take 1 capsule (200 mg total) by mouth 3 (three) times daily as needed for cough. 45 capsule 0  . ipratropium (ATROVENT) 0.06 % nasal spray Place 2 sprays into both nostrils every 4 (four) hours as needed for rhinitis. 10 mL 6  . levofloxacin (LEVAQUIN) 500 MG tablet Take 1 tablet (500 mg total) by mouth daily. 7 tablet 0  . losartan (COZAAR) 25 MG tablet Take 1 tablet (25 mg total) by mouth daily. 90 tablet 1   No current facility-administered medications for this visit.   No Known Allergies   Exam:  BP 100/61 mmHg  Pulse 82  Temp(Src) 98 F (36.7 C) (Oral)  Ht 5\' 3"  (1.6 m)  Wt 132 lb (59.875 kg)  BMI 23.39 kg/m2  SpO2 92% Gen: Well NAD HEENT: EOMI,  MMM Lungs: Normal work  of breathing. Course breath sounds bilaterally with rales left lower lung field.  Heart: RRR no MRG Abd: NABS, Soft. Nondistended, Nontender Exts: Brisk capillary refill, warm and well perfused.   No results found for this or any previous visit (from the past 24 hour(s)). No results found.   Please see individual assessment and plan sections.

## 2016-02-27 NOTE — Assessment & Plan Note (Signed)
Community-acquired pneumonia. Patient is currently taking Levaquin. Obtain CBC and CMP. Follow up Monday if not improving. Recheck x-ray in 4 weeks.

## 2016-03-01 ENCOUNTER — Encounter: Payer: Self-pay | Admitting: Family Medicine

## 2016-03-01 NOTE — Progress Notes (Signed)
Quick Note:  Labs show elevated sugar levels in the blood likely as a result of the pneumonia. This should be fine. ______

## 2016-03-25 ENCOUNTER — Ambulatory Visit (INDEPENDENT_AMBULATORY_CARE_PROVIDER_SITE_OTHER): Payer: BC Managed Care – PPO | Admitting: Family Medicine

## 2016-03-25 ENCOUNTER — Encounter: Payer: Self-pay | Admitting: Family Medicine

## 2016-03-25 ENCOUNTER — Ambulatory Visit (INDEPENDENT_AMBULATORY_CARE_PROVIDER_SITE_OTHER): Payer: BC Managed Care – PPO

## 2016-03-25 VITALS — BP 124/57 | HR 79 | Wt 132.0 lb

## 2016-03-25 DIAGNOSIS — J0100 Acute maxillary sinusitis, unspecified: Secondary | ICD-10-CM

## 2016-03-25 DIAGNOSIS — J189 Pneumonia, unspecified organism: Secondary | ICD-10-CM

## 2016-03-25 MED ORDER — AMOXICILLIN-POT CLAVULANATE 875-125 MG PO TABS
1.0000 | ORAL_TABLET | Freq: Two times a day (BID) | ORAL | Status: DC
Start: 2016-03-25 — End: 2016-04-06

## 2016-03-25 NOTE — Progress Notes (Signed)
   Subjective:    Patient ID: Tanya Cortez, female    DOB: Jun 10, 1951, 65 y.o.   MRN: [id number]  HPI [age] year female here today to follow-up for cough and pneumonia. She was seen about 4 weeks ago and was diagnosed with a right lower lobe infiltrate consistent with pneumonia. She was treated with a course of Levaquin. She is feeling much better but still has a dry cough that seems to be coming in fits. She's also had a lot of nasal congestion that started about 3 weeks ago and is more painful on the right side of her sinuses and even into her right ear. She's not currently taking any cough or cold medications. They did recommend that she have a repeat chest x-ray. No fevers or chills or sweats.   Review of Systems     Objective:   Physical Exam  Constitutional: She is oriented to person, place, and time. She appears well-developed and well-nourished.  HENT:  Head: Normocephalic and atraumatic.  Right Ear: External ear normal.  Left Ear: External ear normal.  Nose: Nose normal.  Mouth/Throat: Oropharynx is clear and moist.  TMs and canals are clear.   Eyes: Conjunctivae and EOM are normal. Pupils are equal, round, and reactive to light.  Neck: Neck supple. No thyromegaly present.  Cardiovascular: Normal rate, regular rhythm and normal heart sounds.   Pulmonary/Chest: Effort normal and breath sounds normal. She has no wheezes.  Lymphadenopathy:    She has no cervical adenopathy.  Neurological: She is alert and oriented to person, place, and time.  Skin: Skin is warm and dry.  Psychiatric: She has a normal mood and affect.          Assessment & Plan:  Acute sinusitis-white and treat her with Augmentin. If she's not significantly better in one week and give me a call back. Call if symptoms get worse or she develops a fever. Next  Right lower lobe pneumonia-I do think overall the acute infection has resolved. They did recommend repeat chest x-ray shows she will go down for that  today.

## 2016-03-26 ENCOUNTER — Ambulatory Visit: Payer: BC Managed Care – PPO | Admitting: Family Medicine

## 2016-03-26 ENCOUNTER — Other Ambulatory Visit: Payer: Self-pay | Admitting: Family Medicine

## 2016-03-26 DIAGNOSIS — J189 Pneumonia, unspecified organism: Secondary | ICD-10-CM

## 2016-04-06 ENCOUNTER — Ambulatory Visit (INDEPENDENT_AMBULATORY_CARE_PROVIDER_SITE_OTHER): Payer: BC Managed Care – PPO | Admitting: Family Medicine

## 2016-04-06 ENCOUNTER — Encounter: Payer: Self-pay | Admitting: Family Medicine

## 2016-04-06 ENCOUNTER — Ambulatory Visit (INDEPENDENT_AMBULATORY_CARE_PROVIDER_SITE_OTHER): Payer: BC Managed Care – PPO

## 2016-04-06 VITALS — BP 120/77 | HR 71 | Temp 98.1°F | Wt 130.0 lb

## 2016-04-06 DIAGNOSIS — J449 Chronic obstructive pulmonary disease, unspecified: Secondary | ICD-10-CM | POA: Diagnosis not present

## 2016-04-06 DIAGNOSIS — R062 Wheezing: Secondary | ICD-10-CM

## 2016-04-06 MED ORDER — ALBUTEROL SULFATE HFA 108 (90 BASE) MCG/ACT IN AERS: 2.0000 | INHALATION_SPRAY | Freq: Four times a day (QID) | RESPIRATORY_TRACT | Status: DC | PRN

## 2016-04-06 NOTE — Progress Notes (Signed)
       Tanya Cortez is a 65 y.o. female who [facility] [place] today for wheezing. Patient was recently treated for community-acquired pneumonia. She feels quite well but notes some subtle wheezing. She's worried that she may be worsening although she feels well. No fevers chills cough or congestion. No shortness of breath.   No past medical history on file. Past Surgical History  Procedure Laterality Date  . Tonsillectomy     Social History  Substance Use Topics  . Smoking status: Never Smoker   . Smokeless tobacco: Not on file  . Alcohol Use: No   family history includes Breast cancer in her mother; COPD in her mother; Heart attack in her father and maternal grandfather; Hypertension in her mother and sister; Skin cancer in her mother; Stroke in her maternal grandfather.  ROS as above Medications: Current Outpatient Prescriptions  Medication Sig Dispense Refill  . losartan (COZAAR) 25 MG tablet Take 1 tablet (25 mg total) by mouth daily. 90 tablet 1  . albuterol (PROVENTIL HFA;VENTOLIN HFA) 108 (90 Base) MCG/ACT inhaler Inhale 2 puffs into the lungs every 6 (six) hours as needed for wheezing or shortness of breath. 1 Inhaler 0   No current facility-administered medications for this visit.   No Known Allergies   Exam:  BP 120/77 mmHg  Pulse 71  Temp(Src) 98.1 F (36.7 C) (Oral)  Wt 130 lb (58.968 kg)  SpO2 98% Gen: Well NAD HEENT: EOMI,  MMM Lungs: Normal work of breathing. Slight expiratory wheeze right lung Heart: RRR no MRG Abd: NABS, Soft. Nondistended, Nontender Exts: Brisk capillary refill, warm and well perfused.   No results found for this or any previous visit (from the past 24 hour(s)). No results found.   Please see individual assessment and plan sections.

## 2016-04-06 NOTE — Assessment & Plan Note (Signed)
Wheezing in the setting of recent community-acquired pneumonia. No history of reactive airway disease. Chest x-ray pending. Trial of albuterol. Recheck if not better.

## 2016-04-06 NOTE — Patient Instructions (Signed)
Thank you for coming in today. Get xray today.  Use the albuterol inhaler as needed.  Return as previously scheduled.

## 2016-04-06 NOTE — Progress Notes (Signed)
Quick Note:  No pneumonia is seen. ______

## 2016-04-30 ENCOUNTER — Telehealth: Payer: Self-pay

## 2016-04-30 NOTE — Telephone Encounter (Signed)
Elease Hashimotoatricia called and would like to know if the CXR she had done with Dr Denyse Amassorey would ok for the follow up CXR Dr Linford ArnoldMetheney was wanting. Please advise.

## 2016-04-30 NOTE — Telephone Encounter (Signed)
Yes, that is fine. 

## 2016-06-13 IMAGING — MG MM DIGITAL SCREENING
4 series · 4 of 4 positions shown · non-contrast
Comparison: None.

CLINICAL DATA: Screening.

EXAM:
DIGITAL SCREENING BILATERAL MAMMOGRAM WITH CAD

[R CC]
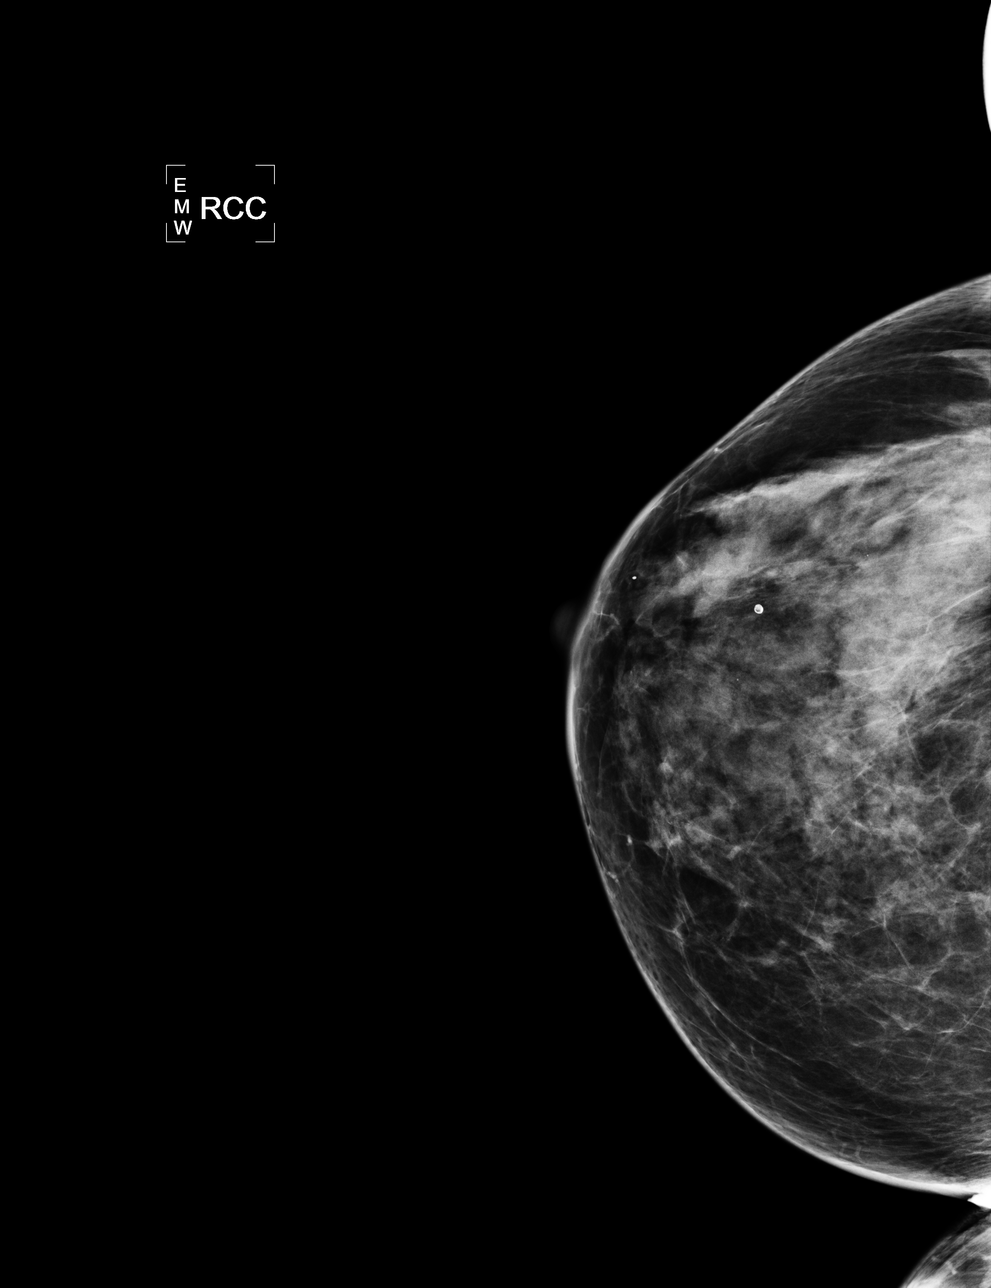

[L CC]
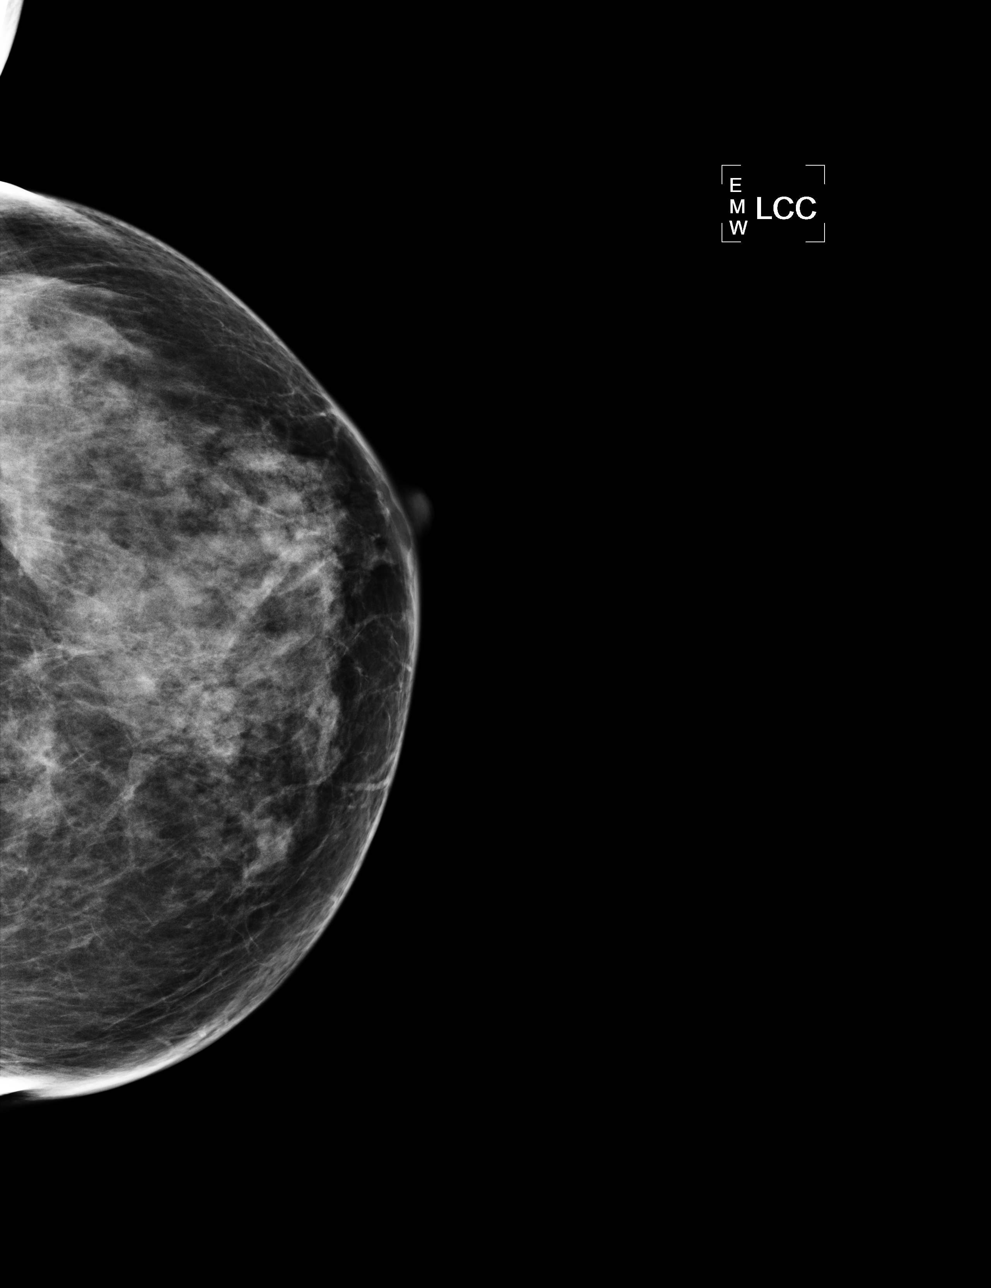

[L MLO]
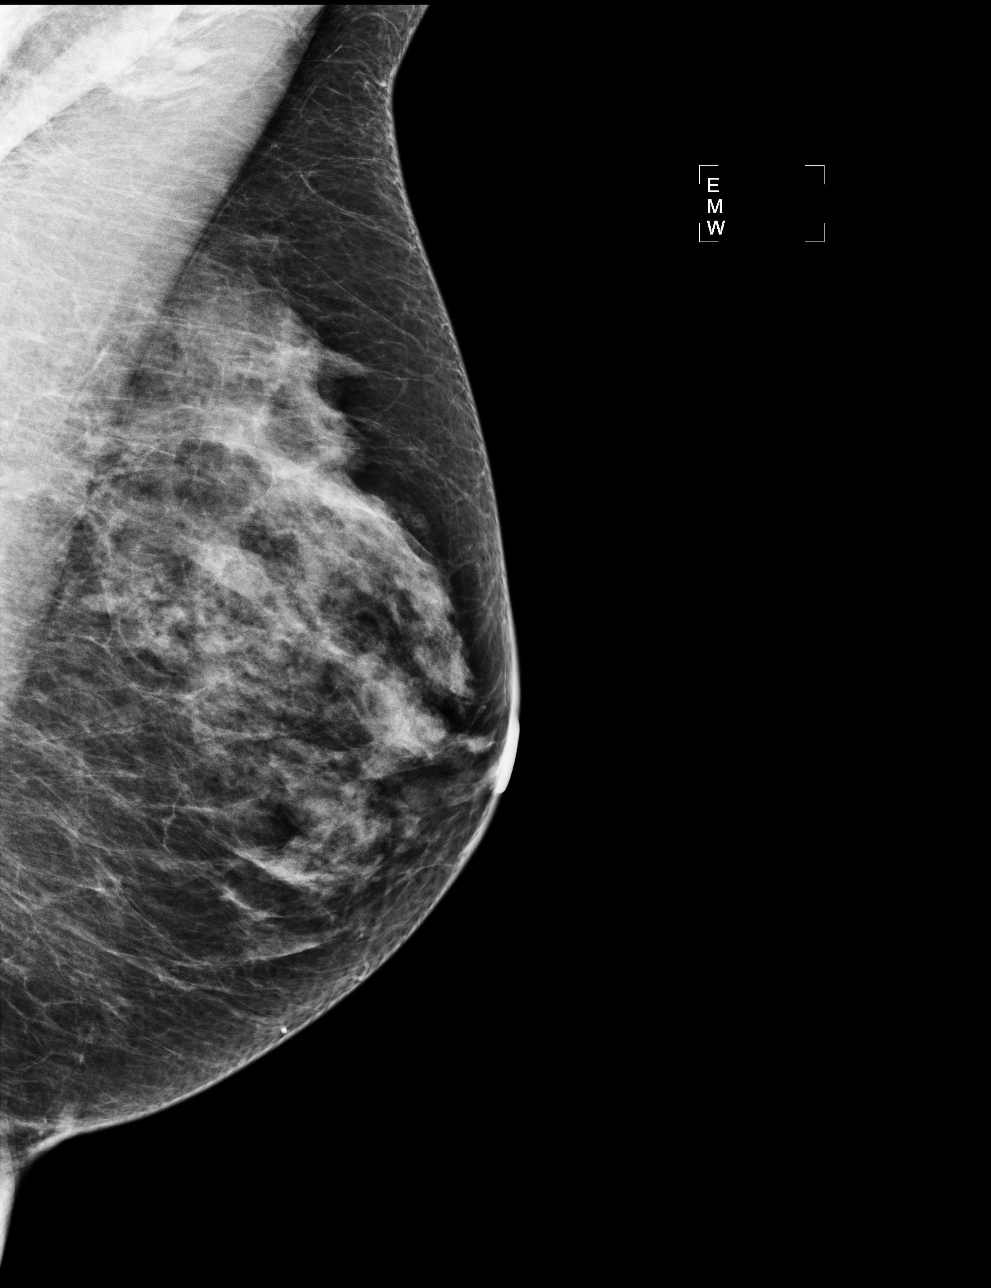

[R MLO]
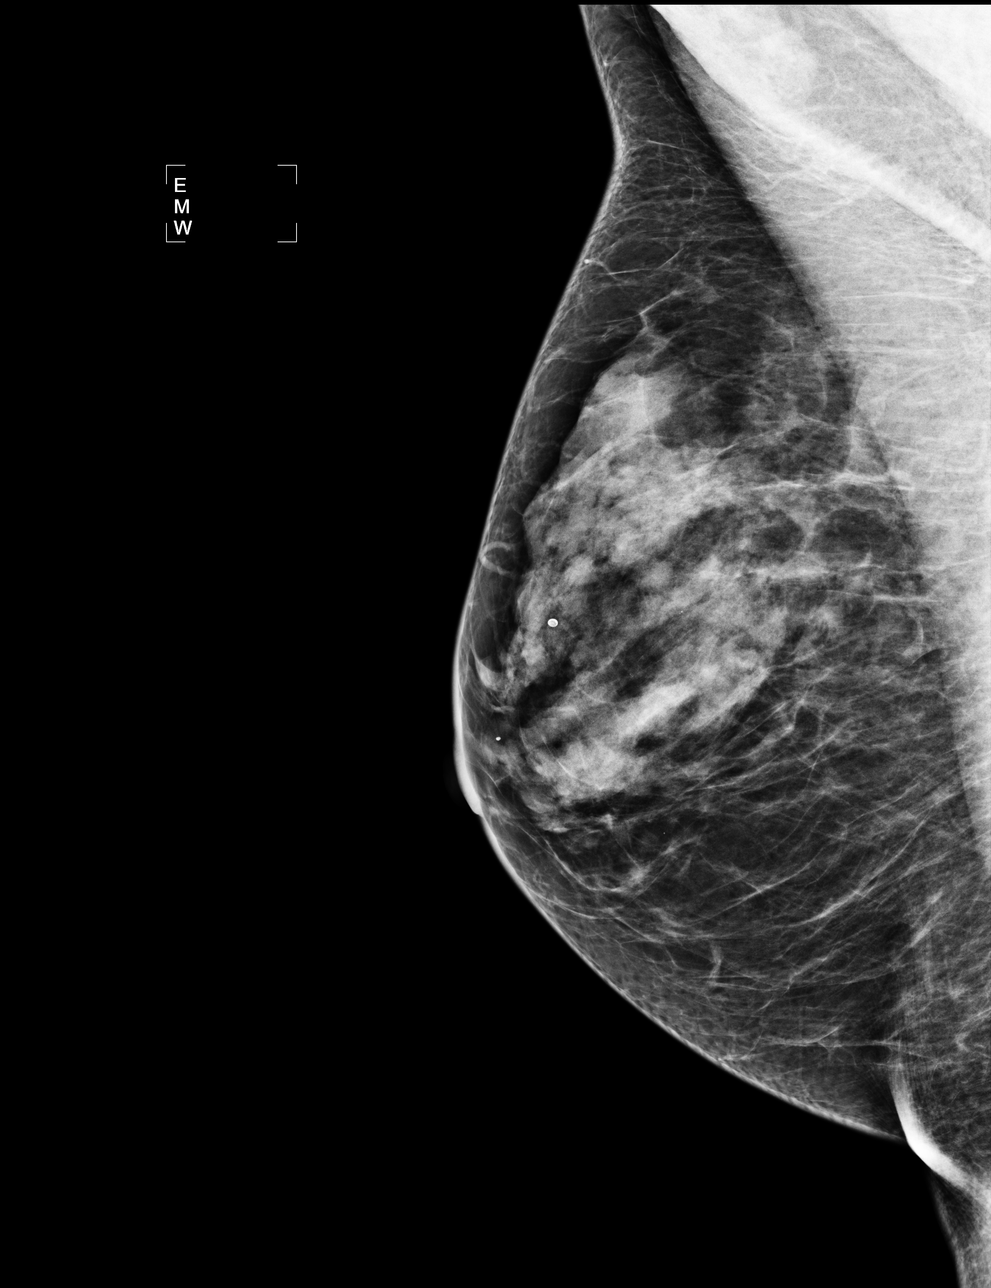

[4 of 4 positions shown; findings below may reference images not displayed]

ACR Breast Density Category c: The breast tissue is heterogeneously
dense, which may obscure small masses
FINDINGS: There are no findings suspicious for malignancy. Images were
processed with CAD.
IMPRESSION: No mammographic evidence of malignancy. A result letter of this
screening mammogram will be mailed directly to the patient.

RECOMMENDATION:
Screening mammogram in one year. (Code:U2-0-761)

BI-RADS CATEGORY  1: Negative.

## 2016-06-23 ENCOUNTER — Ambulatory Visit (INDEPENDENT_AMBULATORY_CARE_PROVIDER_SITE_OTHER): Payer: BC Managed Care – PPO | Admitting: Family Medicine

## 2016-06-23 ENCOUNTER — Encounter: Payer: Self-pay | Admitting: Family Medicine

## 2016-06-23 VITALS — BP 121/66 | HR 76 | Wt 129.0 lb

## 2016-06-23 DIAGNOSIS — I10 Essential (primary) hypertension: Secondary | ICD-10-CM | POA: Diagnosis not present

## 2016-06-23 DIAGNOSIS — R0989 Other specified symptoms and signs involving the circulatory and respiratory systems: Secondary | ICD-10-CM | POA: Diagnosis not present

## 2016-06-23 DIAGNOSIS — Z1231 Encounter for screening mammogram for malignant neoplasm of breast: Secondary | ICD-10-CM

## 2016-06-23 LAB — LIPID PANEL
CHOL/HDL RATIO: 3.3 ratio (ref <=5.0)
CHOLESTEROL: 278 mg/dL — AB (ref 125–200)
HDL: 84 mg/dL (ref >=46)
LDL Cholesterol: 182 mg/dL — ABNORMAL HIGH (ref <130)
TRIGLYCERIDES: 59 mg/dL (ref <150)
VLDL: 12 mg/dL (ref <30)

## 2016-06-23 LAB — COMPLETE METABOLIC PANEL WITH GFR
ALT: 7 U/L (ref 6–29)
AST: 16 U/L (ref 10–35)
Albumin: 4.5 g/dL (ref 3.6–5.1)
Alkaline Phosphatase: 93 U/L (ref 33–130)
BUN: 17 mg/dL (ref 7–25)
CALCIUM: 9.6 mg/dL (ref 8.6–10.4)
CO2: 30 mmol/L (ref 20–31)
Chloride: 104 mmol/L (ref 98–110)
Creat: 0.71 mg/dL (ref 0.50–0.99)
GFR, EST NON AFRICAN AMERICAN: 89 mL/min (ref >=60)
GFR, Est African American: >89 mL/min (ref >=60)
GLUCOSE: 96 mg/dL (ref 65–99)
Potassium: 4.9 mmol/L (ref 3.5–5.3)
SODIUM: 143 mmol/L (ref 135–146)
Total Bilirubin: 0.4 mg/dL (ref 0.2–1.2)
Total Protein: 6.7 g/dL (ref 6.1–8.1)

## 2016-06-23 NOTE — Progress Notes (Signed)
Subjective:    CC: HTN  HPI: Hypertension- Pt denies chest pain, SOB, dizziness, or heart palpitations.  Taking meds as directed w/o problems.  Denies medication side effects.    Review CXR results.  The consolidated area of pneumonia did clear up in the which is a linear streaking area that was most consistent with scar tissue. But it was noted that she had some findings of hyperinflation consistent with possible COPD. She's never been a smoker. She says her mother was diagnosed with COPD.  Past medical history, Surgical history, Family history not pertinant except as noted below, Social history, Allergies, and medications have been entered into the medical record, reviewed, and corrections made.   Review of Systems: No fevers, chills, night sweats, weight loss, chest pain, or shortness of breath.   Objective:    General: Well Developed, well nourished, and in no acute distress.  Neuro: Alert and oriented x3, extra-ocular muscles intact, sensation grossly intact.  HEENT: Normocephalic, atraumatic  Skin: Warm and dry, no rashes. Cardiac: Regular rate and rhythm, no murmurs rubs or gallops, no lower extremity edema.  Respiratory: Clear to auscultation bilaterally. Not using accessory muscles, speaking in full sentences.   Impression and Recommendations:   HTN- Well controlled. Continue current regimen. Follow up in 6 months.   Hyperinflation on chest x-ray-recommend schedule spirometry to further evaluate for possible diagnosis of COPD.  Mammo ordered for august.

## 2016-06-24 ENCOUNTER — Telehealth: Payer: Self-pay

## 2016-06-24 ENCOUNTER — Other Ambulatory Visit: Payer: Self-pay

## 2016-06-24 DIAGNOSIS — Z780 Asymptomatic menopausal state: Secondary | ICD-10-CM

## 2016-06-24 MED ORDER — ATORVASTATIN CALCIUM 20 MG PO TABS: 20.0000 mg | ORAL_TABLET | Freq: Every day | ORAL | Status: DC

## 2016-06-24 NOTE — Telephone Encounter (Signed)
Message on MyChart e-mail.

## 2016-06-24 NOTE — Addendum Note (Signed)
Addended by: Nani GasserMETHENEY, Natallia Stellmach D on: 06/24/2016 05:20 PM   Modules accepted: Orders, SmartSet

## 2016-08-10 ENCOUNTER — Ambulatory Visit (INDEPENDENT_AMBULATORY_CARE_PROVIDER_SITE_OTHER): Payer: BC Managed Care – PPO | Admitting: Family Medicine

## 2016-08-10 VITALS — BP 114/76 | HR 74 | Ht 63.0 in | Wt 135.0 lb

## 2016-08-10 DIAGNOSIS — Z23 Encounter for immunization: Secondary | ICD-10-CM

## 2016-08-10 DIAGNOSIS — R0602 Shortness of breath: Secondary | ICD-10-CM

## 2016-08-10 MED ORDER — ALBUTEROL SULFATE (2.5 MG/3ML) 0.083% IN NEBU
2.5000 mg | INHALATION_SOLUTION | Freq: Once | RESPIRATORY_TRACT | Status: AC
  Administered 2016-08-10: 2.5 mg via RESPIRATORY_TRACT

## 2016-08-10 NOTE — Progress Notes (Signed)
Patient came into clinic today for spirometry. Pt reports she had pneumonia last year and has had some "scarring of her lungs" since. Pt states she does have some shortness of breath sometimes, but overall feels much better. Pt was able to preform spirometry, no immediate complications. Pt was placed in a room to go over results with PCP.

## 2016-08-10 NOTE — Progress Notes (Signed)
   Subjective:    Patient ID: Tanya Cortez, female    DOB: 1951-10-13, 65 y.o.   MRN: [id number]  HPI  Patient is here today today for spirometry. Some hyperinflation was seen on her chest x-ray and she had been expressing some intermittent shortness of breath. No prior history of COPD or asthma etc. She is not a smoker and has not previously been a smoker. Her mother had respiratory problems.    Review of Systems     Objective:   Physical Exam  Constitutional: She is oriented to person, place, and time. She appears well-developed and well-nourished.  HENT:  Head: Normocephalic and atraumatic.  Eyes: Conjunctivae and EOM are normal.  Cardiovascular: Normal rate.   Pulmonary/Chest: Effort normal.  Neurological: She is alert and oriented to person, place, and time.  Skin: Skin is dry. No pallor.  Psychiatric: She has a normal mood and affect. Her behavior is normal.  Vitals reviewed.         Assessment & Plan:  Spirometry today shows FVC of 106%, FEV1 of 98% with a ratio of 70%.  Essentially the spirometry is normal. No significant improvement postbronchodilator. Though she did have improvement in the small airways after albuterol. Says certainly if down the road she has any breathing difficulties etc. she would probably still respond to albuterol. Reviewed results with her.

## 2016-08-11 ENCOUNTER — Ambulatory Visit (INDEPENDENT_AMBULATORY_CARE_PROVIDER_SITE_OTHER): Payer: BC Managed Care – PPO

## 2016-08-11 DIAGNOSIS — M8588 Other specified disorders of bone density and structure, other site: Secondary | ICD-10-CM

## 2016-08-11 DIAGNOSIS — Z1231 Encounter for screening mammogram for malignant neoplasm of breast: Secondary | ICD-10-CM | POA: Diagnosis not present

## 2016-08-11 DIAGNOSIS — M85852 Other specified disorders of bone density and structure, left thigh: Secondary | ICD-10-CM

## 2016-08-11 DIAGNOSIS — Z780 Asymptomatic menopausal state: Secondary | ICD-10-CM

## 2016-09-20 ENCOUNTER — Telehealth: Payer: Self-pay | Admitting: Family Medicine

## 2016-09-20 NOTE — Telephone Encounter (Signed)
Patient wanted to let us know so we could document in her chart that she got her flu shot and whooping cough and vaccine today at her [facility] [employer]

## 2016-09-21 NOTE — Telephone Encounter (Signed)
Vaccines documented in chart.Tanya PacasBarkley, Tanya Coran [name]

## 2016-12-22 ENCOUNTER — Ambulatory Visit: Payer: BC Managed Care – PPO | Admitting: Family Medicine

## 2016-12-23 ENCOUNTER — Ambulatory Visit: Payer: BC Managed Care – PPO | Admitting: Family Medicine

## 2017-01-03 ENCOUNTER — Ambulatory Visit: Payer: BC Managed Care – PPO | Admitting: Family Medicine

## 2017-01-17 ENCOUNTER — Ambulatory Visit (INDEPENDENT_AMBULATORY_CARE_PROVIDER_SITE_OTHER): Payer: BC Managed Care – PPO | Admitting: Family Medicine

## 2017-01-17 ENCOUNTER — Encounter: Payer: Self-pay | Admitting: Family Medicine

## 2017-01-17 VITALS — BP 116/65 | HR 73 | Ht 63.0 in | Wt 133.0 lb

## 2017-01-17 DIAGNOSIS — I10 Essential (primary) hypertension: Secondary | ICD-10-CM | POA: Diagnosis not present

## 2017-01-17 MED ORDER — LOSARTAN POTASSIUM 25 MG PO TABS: 25.0000 mg | ORAL_TABLET | Freq: Every day | ORAL | 1 refills | Status: DC

## 2017-01-17 NOTE — Progress Notes (Signed)
Subjective:    CC: HTN  HPI: Hypertension- Pt denies chest pain, SOB, dizziness, or heart palpitations.  Taking meds as directed w/o problems.  Denies medication side effects.     Past medical history, Surgical history, Family history not pertinant except as noted below, Social history, Allergies, and medications have been entered into the medical record, reviewed, and corrections made.   Review of Systems: No fevers, chills, night sweats, weight loss, chest pain, or shortness of breath.   Objective:    General: Well Developed, well nourished, and in no acute distress.  Neuro: Alert and oriented x3, extra-ocular muscles intact, sensation grossly intact.  HEENT: Normocephalic, atraumatic  Skin: Warm and dry, no rashes. Cardiac: Regular rate and rhythm, no murmurs rubs or gallops, no lower extremity edema.  Respiratory: Clear to auscultation bilaterally. Not using accessory muscles, speaking in full sentences.   Impression and Recommendations:   HTN - Well controlled. Due for BMP today. Follow-up in 6 months.

## 2017-01-18 LAB — BASIC METABOLIC PANEL WITH GFR
BUN: 15 mg/dL (ref 7–25)
CALCIUM: 9.5 mg/dL (ref 8.6–10.4)
CO2: 30 mmol/L (ref 20–31)
CREATININE: 0.81 mg/dL (ref 0.50–0.99)
Chloride: 102 mmol/L (ref 98–110)
GFR, EST NON AFRICAN AMERICAN: 75 mL/min (ref >=60)
GFR, Est African American: 87 mL/min (ref >=60)
Glucose, Bld: 88 mg/dL (ref 65–99)
Potassium: 4.5 mmol/L (ref 3.5–5.3)
Sodium: 140 mmol/L (ref 135–146)

## 2017-01-18 NOTE — Progress Notes (Signed)
All labs are normal. 

## 2017-06-22 ENCOUNTER — Other Ambulatory Visit: Payer: Self-pay | Admitting: Family Medicine

## 2017-07-15 ENCOUNTER — Other Ambulatory Visit: Payer: Self-pay | Admitting: Family Medicine

## 2017-07-15 DIAGNOSIS — Z1231 Encounter for screening mammogram for malignant neoplasm of breast: Secondary | ICD-10-CM

## 2017-07-27 ENCOUNTER — Ambulatory Visit: Payer: BC Managed Care – PPO | Admitting: Family Medicine

## 2017-08-02 ENCOUNTER — Ambulatory Visit (INDEPENDENT_AMBULATORY_CARE_PROVIDER_SITE_OTHER): Payer: BC Managed Care – PPO | Admitting: Family Medicine

## 2017-08-02 ENCOUNTER — Encounter: Payer: Self-pay | Admitting: Family Medicine

## 2017-08-02 VITALS — BP 113/50 | HR 75 | Wt 135.0 lb

## 2017-08-02 DIAGNOSIS — Z1159 Encounter for screening for other viral diseases: Secondary | ICD-10-CM | POA: Diagnosis not present

## 2017-08-02 DIAGNOSIS — I10 Essential (primary) hypertension: Secondary | ICD-10-CM | POA: Diagnosis not present

## 2017-08-02 DIAGNOSIS — E7800 Pure hypercholesterolemia, unspecified: Secondary | ICD-10-CM | POA: Diagnosis not present

## 2017-08-02 LAB — HEPATIC FUNCTION PANEL
ALK PHOS: 93 U/L (ref 33–130)
ALT: 11 U/L (ref 6–29)
AST: 19 U/L (ref 10–35)
Albumin: 4.5 g/dL (ref 3.6–5.1)
BILIRUBIN DIRECT: 0.1 mg/dL (ref <=0.2)
BILIRUBIN INDIRECT: 0.4 mg/dL (ref 0.2–1.2)
BILIRUBIN TOTAL: 0.5 mg/dL (ref 0.2–1.2)
TOTAL PROTEIN: 6.6 g/dL (ref 6.1–8.1)

## 2017-08-02 LAB — LIPID PANEL W/REFLEX DIRECT LDL
Cholesterol: 190 mg/dL (ref <200)
HDL: 79 mg/dL (ref >50)
LDL-CHOLESTEROL: 95 mg/dL
Non-HDL Cholesterol (Calc): 111 mg/dL (ref <130)
TRIGLYCERIDES: 74 mg/dL (ref <150)
Total CHOL/HDL Ratio: 2.4 Ratio (ref <5.0)

## 2017-08-02 MED ORDER — LOSARTAN POTASSIUM 25 MG PO TABS: 25.0000 mg | ORAL_TABLET | Freq: Every day | ORAL | 1 refills | Status: DC

## 2017-08-02 NOTE — Progress Notes (Signed)
Subjective:    CC: HTN, cholesterol   HPI: Hypertension- Pt denies chest pain, SOB, dizziness, or heart palpitations.  Taking meds as directed w/o problems.  Denies medication side effects.    Recent hyperlipidemia dx. Started lipitor about 6 weeks ago.  Lab Results  Component Value Date   CHOL 278 (H) 06/23/2016   HDL 84 06/23/2016   LDLCALC 182 (H) 06/23/2016   TRIG 59 06/23/2016   CHOLHDL 3.3 06/23/2016     Review of Systems: No fevers, chills, night sweats, weight loss, chest pain, or shortness of breath.   Objective:    General: Well Developed, well nourished, and in no acute distress.  Neuro: Alert and oriented x3, extra-ocular muscles intact, sensation grossly intact.  HEENT: Normocephalic, atraumatic  Skin: Warm and dry, no rashes. Cardiac: Regular rate and rhythm, no murmurs rubs or gallops, no lower extremity edema. No carotid bruit.   Respiratory: Clear to auscultation bilaterally. Not using accessory muscles, speaking in full sentences.   Impression and Recommendations:    HTN - Well controlled. Continue current regimen. Follow up in  6 months.   Hyperlipidemia - Tolerating statin well without any side effects. Due to recheck lipids and hepatic function to make sure that we are getting to therapeutic goal.

## 2017-08-03 LAB — HEPATITIS C ANTIBODY: HCV Ab: NONREACTIVE

## 2017-08-17 ENCOUNTER — Ambulatory Visit (INDEPENDENT_AMBULATORY_CARE_PROVIDER_SITE_OTHER): Payer: BC Managed Care – PPO

## 2017-08-17 DIAGNOSIS — Z1231 Encounter for screening mammogram for malignant neoplasm of breast: Secondary | ICD-10-CM

## 2018-02-01 ENCOUNTER — Ambulatory Visit: Payer: BC Managed Care – PPO | Admitting: Family Medicine

## 2018-02-01 ENCOUNTER — Encounter: Payer: Self-pay | Admitting: Family Medicine

## 2018-02-01 VITALS — BP 132/66 | HR 77 | Ht 63.0 in | Wt 137.0 lb

## 2018-02-01 DIAGNOSIS — I10 Essential (primary) hypertension: Secondary | ICD-10-CM | POA: Diagnosis not present

## 2018-02-01 DIAGNOSIS — M25551 Pain in right hip: Secondary | ICD-10-CM | POA: Diagnosis not present

## 2018-02-01 DIAGNOSIS — M25552 Pain in left hip: Secondary | ICD-10-CM

## 2018-02-01 MED ORDER — LOSARTAN POTASSIUM 25 MG PO TABS: 25.0000 mg | ORAL_TABLET | Freq: Every day | ORAL | 1 refills | Status: DC

## 2018-02-01 NOTE — Progress Notes (Signed)
   Subjective:    Patient ID: Tanya Cortez, female    DOB: 09-Apr-1951, 67 y.o.   MRN: [id number]  HPI Hypertension- Pt denies chest pain, SOB, dizziness, or heart palpitations.  Taking meds as directed w/o problems.  Denies medication side effects.    Tanya Cortez also complains of bilateral hip pain.  Tanya Cortez points to the area just posterior to the greater trochanter.  His posterior lateral.  It is bilateral but seems to be worse on the right compared to the left.  Is been going on for months.  Tanya Cortez says it does not keep her from her activities.  Tanya Cortez is still walking, exercising, golfing etc.  Tanya Cortez notices it mostly when Tanya Cortez goes from sitting to standing especially if Tanya Cortez is been sitting for a while.  Tanya Cortez does use Aleve and that does provide some relief for her.  Tanya Cortez also has some pain in her knees that time mostly right around the kneecaps but this is not as bothersome.  Tanya Cortez denies any pain radiating down into her legs or from her back.   Review of Systems     Objective:   Physical Exam  Constitutional: Tanya Cortez is oriented to person, place, and time. Tanya Cortez appears well-developed and well-nourished.  HENT:  Head: Normocephalic and atraumatic.  Cardiovascular: Normal rate, regular rhythm and normal heart sounds.  Pulmonary/Chest: Effort normal and breath sounds normal.  Musculoskeletal:  Nontender over the greater trochanters bilaterally.  Hip flexion extension abduction and abduction is 5 out of 5 in all directions at the hip knee and ankles.  Patellar reflexes 1+ bilaterally.  Tanya Cortez did have some pain with bending the knee and stretching the leg across the body.  Stretching the piriformis muscle.  More so on the left compared to the right.  Neurological: Tanya Cortez is alert and oriented to person, place, and time.  Skin: Skin is warm and dry.  Psychiatric: Tanya Cortez has a normal mood and affect. Her behavior is normal.        Assessment & Plan:  HTN - Well controlled. Continue current regimen. Follow up in  6 months.  Due for lab in 6 months.    bilat hip pain - possible piriformis syndrome - will tx with exercises.  If not better after the next 3 weeks and please let me know.  Okay to use Aleve as long as Tanya Cortez is taking it occasionally.  If Tanya Cortez needs something every day than would be better to switch to Tylenol type product to avoid risk of GI bleed or stomach irritation.  But Tanya Cortez does get relief with Aleve which is good.  I will get her in with sports medicine if not improving.  Still consider could be Oster arthritis.  Unlikely to be bursitis.  Tanya Cortez is not getting any sciatic type symptoms.

## 2018-02-01 NOTE — Patient Instructions (Signed)
Call if not better with your hips in about 3 weeks.

## 2018-04-18 ENCOUNTER — Encounter: Payer: Self-pay | Admitting: Physical Therapy

## 2018-04-18 ENCOUNTER — Encounter: Payer: Self-pay | Admitting: Family Medicine

## 2018-04-18 ENCOUNTER — Ambulatory Visit: Payer: BC Managed Care – PPO | Admitting: Family Medicine

## 2018-04-18 ENCOUNTER — Ambulatory Visit (INDEPENDENT_AMBULATORY_CARE_PROVIDER_SITE_OTHER): Payer: BC Managed Care – PPO

## 2018-04-18 ENCOUNTER — Ambulatory Visit: Payer: BC Managed Care – PPO | Admitting: Physical Therapy

## 2018-04-18 VITALS — BP 118/75 | HR 69 | Ht 62.99 in | Wt 138.0 lb

## 2018-04-18 DIAGNOSIS — M25652 Stiffness of left hip, not elsewhere classified: Secondary | ICD-10-CM | POA: Diagnosis not present

## 2018-04-18 DIAGNOSIS — M160 Bilateral primary osteoarthritis of hip: Secondary | ICD-10-CM

## 2018-04-18 DIAGNOSIS — M25651 Stiffness of right hip, not elsewhere classified: Secondary | ICD-10-CM

## 2018-04-18 DIAGNOSIS — M25552 Pain in left hip: Principal | ICD-10-CM

## 2018-04-18 DIAGNOSIS — M25551 Pain in right hip: Secondary | ICD-10-CM

## 2018-04-18 NOTE — Therapy (Signed)
[facility] [address] [place], [place], [address] Phone: [phone]   Fax:  [phone]  Physical Therapy Evaluation  Patient Details  Name: Tanya Cortez MRN: [id number] Date of Birth: 1951/08/10 Referring Provider: Dr Clementeen GrahamEvan Corey   Encounter Date: 04/18/2018  PT End of Session - 04/18/18 1038    Visit Number  1    Number of Visits  6    Date for PT Re-Evaluation  05/30/18    PT Start Time  1038    PT Stop Time  1133    PT Time Calculation (min)  55 min    Activity Tolerance  Patient tolerated treatment well       History reviewed. No pertinent past medical history.  Past Surgical History:  Procedure Laterality Date  . TONSILLECTOMY      There were no vitals filed for this visit.   Subjective Assessment - 04/18/18 1038    Subjective  Pt reports that both of her hips bother her on/off for about 3 yrs, starts in the gluts and moves into her groins.  Notices the most with lifting the leg into the car and with transfering to stand after sitting on her couch. Is doing exercise every AM, bridging, LTR stretch, SLR - 2 ways, forward bend stretch, lifiting legs over head.     How long can you sit comfortably?  none    How long can you walk comfortably?  none    Diagnostic tests  x-rays ordered    Patient Stated Goals  be able to use her joints until she is 67 yo, get exercises that will help her.     Currently in Pain?  Yes    Pain Score  2     Pain Location  Buttocks    Pain Orientation  Left;Right moves side to side    Pain Descriptors / Indicators  Burning;Dull;Aching    Pain Type  Chronic pain    Pain Radiating Towards  sometimes into her knees    Pain Onset  More than a month ago    Pain Frequency  Constant    Aggravating Factors   lifitng the leg/knees up         [facility] - 04/18/18 0001      Assessment   Medical Diagnosis  Bilat hip bursitis    Referring Provider  Dr Clementeen GrahamEvan Corey    Onset Date/Surgical Date  04/19/15    Hand Dominance  Right    Next MD Visit  8 wks    Prior Therapy  never      Precautions   Precautions  None    Required Braces or Orthoses  -- none      Balance Screen   Has the patient fallen in the past 6 months  No      Home Environment   Living Environment  Private residence    Living Arrangements  Spouse/significant other      Prior Function   Level of Independence  Independent    Vocation  Full time employment    Vocation Requirements  school admin    Leisure  work out , play golf      Observation/Other Assessments   Focus on Therapeutic Outcomes (FOTO)   29% limited      Functional Tests   Functional tests  Squat;Single leg stance      Squat   Comments  decreased due to hip pain, fair eccentric quad  Single Leg Stance   Comments  > 12 sec each , Rt in two attempts      Posture/Postural Control   Posture/Postural Control  Postural limitations    Postural Limitations  Flexed trunk;Increased thoracic kyphosis      ROM / Strength   AROM / PROM / Strength  AROM;Strength      AROM   AROM Assessment Site  Hip;Ankle;Lumbar    Right/Left Hip  Left;Right Rt hip ext 15 degrees, Lt 20    Right Hip Extension  15    Right Hip External Rotation   12    Right Hip Internal Rotation   50    Right Hip ABduction  36    Left Hip Extension  20    Left Hip External Rotation   15    Left Hip Internal Rotation   43    Left Hip ABduction  35    Lumbar Flexion  to floor    Lumbar Extension  50% present    Lumbar - Right Side Bend  75% present with stiffness in back    Lumbar - Left Side Bend  75 % present, back stiffness    Lumbar - Right Rotation  WNL    Lumbar - Left Rotation  WNL      Strength   Strength Assessment Site  Hip;Knee;Ankle;Lumbar    Right/Left Hip  -- WNL    Right/Left Knee  -- WNL    Right/Left Ankle  -- WNL      Flexibility   Soft Tissue Assessment /Muscle Length  yes    Hamstrings  bilat WNL    Quadriceps   bilat WNL    ITB  bilat tight Rt > Lt      Palpation   Palpation comment  very tight in bilat hip adductors, tight in bilat hips with PA and AP mobs                Objective measurements completed on examination: See above findings.      [facility] Adult PT Treatment/Exercise - 04/18/18 0001      Exercises   Exercises  Knee/Hip      Knee/Hip Exercises: Stretches   Hip Flexor Stretch  Both;2 reps;60 seconds in half kneel    ITB Stretch  Both;2 reps;60 seconds cross body with strap    Other Knee/Hip Stretches  butterfly stretch bilat 2x60sec    Other Knee/Hip Stretches  windshield wipers bilat hips in hooklying x 20                   PT Long Term Goals - 04/18/18 1150      PT LONG TERM GOAL #1   Title  I with HEP for stretching bilat hips ( 05/30/18)     Time  6    Period  Weeks    Status  New      PT LONG TERM GOAL #2   Title  improve bilat hip ER to allow her to sit with her legs crossed to donn shoes/socks ( 05/30/18)     Time  6    Period  Weeks    Status  New      PT LONG TERM GOAL #3   Title  improve bilat hip adduction to within 5 degrees from WNL ( 05/30/18)     Time  6    Period  Weeks    Status  New      PT LONG TERM GOAL #4  Title  report =/> 75% reduction of pain with getting in her car ( 05/30/18)     Time  6    Period  Weeks    Status  New      PT LONG TERM GOAL #5   Title  improve FOTO =/< 24% limited ( 05/30/18)     Time  6    Period  Weeks    Status  New      Additional Long Term Goals   Additional Long Term Goals  Yes      PT LONG TERM GOAL #6   Title  walk with upright posture without verbal cues ( 05/30/18)     Time  6    Period  Weeks    Status  New             Plan - 04/18/18 1146    Clinical Impression Statement  67 yo very active female with ~ 3 yr h/o hip pain that moves from one side to the other. She has been performing some exercise at home and isn't seeing improvement.  She is very tight in bilat hips.  Overall  her strength is good. Would benefit from PT to assist with loosening up her hips and decreasing pain.     Clinical Presentation  Stable    Clinical Decision Making  Low    Rehab Potential  Excellent    PT Frequency  1x / week    PT Duration  6 weeks    PT Treatment/Interventions  Iontophoresis 4mg /ml Dexamethasone;Gait training;Dry needling;Manual techniques;Moist Heat;Traction;Patient/family education;Therapeutic exercise;Cryotherapy;Electrical Stimulation;Ultrasound;Passive range of motion    PT Next Visit Plan  bilat hip mobs, manual work/DN bilat hip adductors, hip traction and stetches. Modalites PRN    Consulted and Agree with Plan of Care  Patient       Patient will benefit from skilled therapeutic intervention in order to improve the following deficits and impairments:  Abnormal gait, Pain, Postural dysfunction, Increased muscle spasms, Decreased range of motion  Visit Diagnosis: Stiffness of right hip, not elsewhere classified - Plan: PT plan of care cert/re-cert  Stiffness of left hip, not elsewhere classified - Plan: PT plan of care cert/re-cert  Pain in right hip - Plan: PT plan of care cert/re-cert  Pain in left hip - Plan: PT plan of care cert/re-cert     Problem List Patient Active Problem List   Diagnosis Date Noted  . Heart murmur 05/24/2014  . Hyperlipidemia 04/22/2014  . Essential hypertension, benign 04/22/2014    Roderic Scarce PT  04/18/2018, 11:57 AM  [facility] [address] [place], [place], [address] Phone: [phone]   Fax:  [phone]  Name: Shannon Balthazar MRN: [id number] Date of Birth: 1951/06/26

## 2018-04-18 NOTE — Patient Instructions (Signed)
    Stretch out strap,  [employer] or [email or web address]    Trigger Point Dry Needling  . What is Trigger Point Dry Needling (DN)? o DN is a physical therapy technique used to treat muscle pain and dysfunction. Specifically, DN helps deactivate muscle trigger points (muscle knots).  o A thin filiform needle is used to penetrate the skin and stimulate the underlying trigger point. The goal is for a local twitch response (LTR) to occur and for the trigger point to relax. No medication of any kind is injected during the procedure.   . What Does Trigger Point Dry Needling Feel Like?  o The procedure feels different for each individual patient. Some patients report that they do not actually feel the needle enter the skin and overall the process is not painful. Very mild bleeding may occur. However, many patients feel a deep cramping in the muscle in which the needle was inserted. This is the local twitch response.   Marland Kitchen. How Will I feel after the treatment? o Soreness is normal, and the onset of soreness may not occur for a few hours. Typically this soreness does not last longer than two days.  o Bruising is uncommon, however; ice can be used to decrease any possible bruising.  o In rare cases feeling tired or nauseous after the treatment is normal. In addition, your symptoms may get worse before they get better, this period will typically not last longer than 24 hours.   . What Can I do After My Treatment? o Increase your hydration by drinking more water for the next 24 hours. o You may place ice or heat on the areas treated that have become sore, however, do not use heat on inflamed or bruised areas. Heat often brings more relief post needling. o You can continue your regular activities, but vigorous activity is not recommended initially after the treatment for 24 hours. o DN is best combined with other physical therapy such as strengthening, stretching, and other therapies.

## 2018-04-18 NOTE — Progress Notes (Signed)
   Subjective:    I'm seeing this patient as a consultation for:  Dr. Linford ArnoldMetheney   CC: Hip Pain   HPI: Patient reports chronic bilateral hip pain over the last 3 years. She is very active and feels a dull pain on her lateral hips BL when she stands up from a chair or climbs up steps. She states the pain sometimes radiates inward towards her groin and describes it as burning. She denies urinary symptoms or fever.  She denies any radiating pain weakness or numbness fevers or chills. She denies any injury.   Past medical history, Surgical history, Family history not pertinant except as noted below, Social history, Allergies, and medications have been entered into the medical record, reviewed, and no changes needed.   Review of Systems: No headache, visual changes, nausea, vomiting, diarrhea, constipation, dizziness, abdominal pain, skin rash, fevers, chills, night sweats, weight loss, swollen lymph nodes, body aches, joint swelling, muscle aches, chest pain, shortness of breath, mood changes, visual or auditory hallucinations.   Objective:    Vitals:   04/18/18 0952  BP: 118/75  Pulse: 69  SpO2: 100%   General: Well Developed, well nourished, and in no acute distress.  Neuro/Psych: Alert and oriented x3, extra-ocular muscles intact, able to move all 4 extremities, sensation grossly intact. Skin: Warm and dry, no rashes noted.  Respiratory: Not using accessory muscles, speaking in full sentences, trachea midline.  Cardiovascular: Pulses palpable, no extremity edema. Abdomen: Does not appear distended. MSK:   Hips: No erythema, deformities or obvious effusions Strength 5/5 with abduction, adduction, and flextion ROM normal  Mild tenderness to palpation in the glut medius and with resisted abduction  Xray pelvis my personal independent review of the images.  Mild to moderate degenerative changes femorotibial joint bilaterally with no acute findings. Awaiting formal radiology  review  Impression and Recommendations:    Assessment and Plan: 67 y.o. female with .  Patient appears to have irritation to one or more of the hip rotators and abductors BL. This problem will most likely improve with physical therapy. Tanya Cortez is a highly motivated and active individual and will improve quickly. Despite a low suspicion, we will X-ray her hips as well just to rule out severe arthritis.    Orders Placed This Encounter  Procedures  . DG Pelvis 1-2 Views    Standing Status:   Future    Number of Occurrences:   1    Standing Expiration Date:   06/19/2019    Order Specific Question:   Reason for Exam (SYMPTOM  OR DIAGNOSIS REQUIRED)    Answer:   eval pain bl hip. 1 viw ap ok    Order Specific Question:   Preferred imaging location?    [facility]    Order Specific Question:   Radiology Contrast Protocol - do NOT remove file path    Answer:   [email or web address]  . Ambulatory referral to Physical Therapy    Referral Priority:   Routine    Referral Type:   Physical Medicine    Referral Reason:   Specialty Services Required    Requested Specialty:   Physical Therapy   No orders of the defined types were placed in this encounter.   Discussed warning signs or symptoms. Please see discharge instructions. Patient expresses understanding.

## 2018-04-18 NOTE — Patient Instructions (Addendum)
Thank you for coming in today. Attend PT.  Get xray today.  Recheck with me in 8 weeks.  Return sooner if needed.    Hip Bursitis Hip bursitis is swelling of a fluid-filled sac (bursa) in your hip. This swelling (inflammation) can be painful. This condition may come and go over time. Follow these instructions at home: Medicines  Take over-the-counter and prescription medicines only as told by your doctor.  Do not drive or use heavy machinery while taking prescription pain medicine, or as told by your doctor.  If you were prescribed an antibiotic medicine, take it as told by your doctor. Do not stop taking the antibiotic even if you start to feel better. Activity  Return to your normal activities as told by your doctor. Ask your doctor what activities are safe for you.  Rest and protect your hip until you feel better. General instructions  Wear wraps that put pressure on your hip (compression wraps) only as told by your doctor.  Raise (elevate) your hip above the level of your heart as much as you can. To do this, try putting a pillow under your hips while you lie down. Stop if this causes pain.  Do not use your hip to support your body weight until your doctor says that you can.  Use crutches as told by your doctor.  Gently rub and stretch your injured area as often as is comfortable.  Keep all follow-up visits as told by your doctor. This is important. How is this prevented?  Exercise regularly, as told by your doctor.  Warm up and stretch before being active.  Cool down and stretch after being active.  Avoid activities that bother your hip or cause pain.  Avoid sitting down for long periods at a time. Contact a doctor if:  You have a fever.  You get new symptoms.  You have trouble walking.  You have trouble doing everyday activities.  You have pain that gets worse.  You have pain that does not get better with medicine.  You get red skin on your hip  area.  You get a feeling of warmth in your hip area. Get help right away if:  You cannot move your hip.  You have very bad pain. This information is not intended to replace advice given to you by your health care provider. Make sure you discuss any questions you have with your health care provider. Document Released: 01/15/2011 Document Revised: 05/20/2016 Document Reviewed: 07/15/2015 Elsevier Interactive Patient Education  [employer]2018 [employer]

## 2018-04-19 ENCOUNTER — Ambulatory Visit: Payer: BC Managed Care – PPO | Admitting: Rehabilitative and Restorative Service Providers"

## 2018-04-20 ENCOUNTER — Encounter: Payer: Self-pay | Admitting: Family Medicine

## 2018-04-20 DIAGNOSIS — M76899 Other specified enthesopathies of unspecified lower limb, excluding foot: Secondary | ICD-10-CM | POA: Insufficient documentation

## 2018-04-20 DIAGNOSIS — M8580 Other specified disorders of bone density and structure, unspecified site: Secondary | ICD-10-CM

## 2018-04-20 HISTORY — DX: Other specified disorders of bone density and structure, unspecified site: M85.80

## 2018-04-25 ENCOUNTER — Ambulatory Visit: Payer: BC Managed Care – PPO | Admitting: Physical Therapy

## 2018-04-25 ENCOUNTER — Encounter: Payer: Self-pay | Admitting: Physical Therapy

## 2018-04-25 DIAGNOSIS — M25552 Pain in left hip: Secondary | ICD-10-CM | POA: Diagnosis not present

## 2018-04-25 DIAGNOSIS — M25652 Stiffness of left hip, not elsewhere classified: Secondary | ICD-10-CM | POA: Diagnosis not present

## 2018-04-25 DIAGNOSIS — M25651 Stiffness of right hip, not elsewhere classified: Secondary | ICD-10-CM | POA: Diagnosis not present

## 2018-04-25 DIAGNOSIS — M25551 Pain in right hip: Secondary | ICD-10-CM

## 2018-04-25 NOTE — Therapy (Signed)
[facility] [place] [address] [place], [place], [address] Phone: [phone]   Fax:  [phone]  Physical Therapy Treatment  Patient Details  Name: Tanya Cortez MRN: [id number] Date of Birth: 12/24/1951 Referring Provider: Dr Clementeen Graham   Encounter Date: 04/25/2018  PT End of Session - 04/25/18 0849    Visit Number  2    Number of Visits  6    Date for PT Re-Evaluation  05/30/18    PT Start Time  0849    PT Stop Time  0947    PT Time Calculation (min)  58 min       History reviewed. No pertinent past medical history.  Past Surgical History:  Procedure Laterality Date  . TONSILLECTOMY      There were no vitals filed for this visit.  Subjective Assessment - 04/25/18 0849    Subjective  Doing well with HEP, pain has moved more anterior groin.     Currently in Pain?  Yes    Pain Score  2     Pain Location  Groin    Pain Orientation  Left;Right    Pain Descriptors / Indicators  Dull    Pain Type  Chronic pain    Pain Onset  More than a month ago    Pain Frequency  Intermittent    Aggravating Factors   walking    Pain Relieving Factors  rest                       [facility] Adult PT Treatment/Exercise - 04/25/18 0001      Knee/Hip Exercises: Stretches   ITB Stretch  Both;30 seconds with strap    Other Knee/Hip Stretches  butterfly stretch bilat 2x60sec    Other Knee/Hip Stretches  bilat hip adductor stretch with strap      Manual Therapy   Manual Therapy  Soft tissue mobilization    Soft tissue mobilization  STM to bilat hip adductors and quads. Very tight in the adductors       Trigger Point Dry Needling - 04/25/18 0852    Consent Given?  Yes    Education Handout Provided  Yes    Muscles Treated Lower Body  Adductor longus/brevius/maximus    Adductor Response  Palpable increased muscle length;Twitch response elicited bilat with stim                PT Long Term Goals - 04/25/18  0910      PT LONG TERM GOAL #1   Title  I with HEP for stretching bilat hips ( 05/30/18)     Status  On-going      PT LONG TERM GOAL #2   Title  improve bilat hip ER to allow her to sit with her legs crossed to donn shoes/socks ( 05/30/18)     Status  On-going improving      PT LONG TERM GOAL #3   Title  improve bilat hip adduction to within 5 degrees from WNL ( 05/30/18)     Status  On-going      PT LONG TERM GOAL #4   Title  report =/> 75% reduction of pain with getting in her car ( 05/30/18)     Status  On-going still very sore and hard      PT LONG TERM GOAL #5   Title  improve FOTO =/< 24% limited ( 05/30/18)     Status  On-going  PT LONG TERM GOAL #6   Title  walk with upright posture without verbal cues ( 05/30/18)     Status  On-going            Plan - 04/25/18 1238    Clinical Impression Statement  Trish reports she is doing well with her stretches, has noticed a little improvement.  She tolerated treatment well today with increased hip motion.  She continues to have tightness and pain with lifting her legs up/walking.  May need more manual work, then strengthening .     Rehab Potential  Excellent    PT Frequency  1x / week    PT Duration  6 weeks    PT Treatment/Interventions  Iontophoresis /ml Dexamethasone;Gait training;Dry needling;Manual techniques;Moist Heat;Traction;Patient/family education;Therapeutic exercise;Cryotherapy;Electrical Stimulation;Ultrasound;Passive range of motion    PT Next Visit Plan  hip mobs, assess response to DN and manual work    Financial planner with Plan of Care  Patient       Patient will benefit from skilled therapeutic intervention in order to improve the following deficits and impairments:  Abnormal gait, Pain, Postural dysfunction, Increased muscle spasms, Decreased range of motion  Visit Diagnosis: Stiffness of right hip, not elsewhere classified  Stiffness of left hip, not elsewhere classified  Pain in right hip  Pain  in left hip     Problem List Patient Active Problem List   Diagnosis Date Noted  . Osteopenia determined by x-ray 04/20/2018  . Tendonitis involving hip abductors 04/20/2018  . Heart murmur 05/24/2014  . Hyperlipidemia 04/22/2014  . Essential hypertension, benign 04/22/2014    Roderic Scarce PT  04/25/2018, 12:45 PM  [facility] [address] [place], [place], [address] Phone: [phone]   Fax:  [phone]  Name: Tanya Cortez MRN: [id number] Date of Birth: 04-Sep-1951

## 2018-05-02 ENCOUNTER — Encounter: Payer: Self-pay | Admitting: Physical Therapy

## 2018-05-02 ENCOUNTER — Ambulatory Visit: Payer: BC Managed Care – PPO | Admitting: Physical Therapy

## 2018-05-02 DIAGNOSIS — M25551 Pain in right hip: Secondary | ICD-10-CM

## 2018-05-02 DIAGNOSIS — M25552 Pain in left hip: Secondary | ICD-10-CM | POA: Diagnosis not present

## 2018-05-02 DIAGNOSIS — M25652 Stiffness of left hip, not elsewhere classified: Secondary | ICD-10-CM

## 2018-05-02 DIAGNOSIS — M25651 Stiffness of right hip, not elsewhere classified: Secondary | ICD-10-CM | POA: Diagnosis not present

## 2018-05-02 NOTE — Patient Instructions (Addendum)
Piriformis Stretch, Supine - START KEEPING YOUR FOOT ON THE FLOOR    Lie supine, one ankle crossed onto opposite knee. Holding bottom leg behind knee, gently pull legs toward chest until stretch is felt in buttock of top leg. Hold _45-60__ seconds. For deeper stretch gently push top knee away from body. Perform on both sides Repeat __2_ times per session. Do _1-2__ sessions per day.

## 2018-05-02 NOTE — Therapy (Signed)
[facility] [place] [address] [place], [place], [address] Phone: [phone]   Fax:  [phone]  Physical Therapy Treatment  Patient Details  Name: Tanya Cortez MRN: [id number] Date of Birth: 21-Jun-1951 Referring Provider: Dr Denyse Amass   Encounter Date: 05/02/2018  PT End of Session - 05/02/18 0801    Visit Number  3    Number of Visits  6    Date for PT Re-Evaluation  05/30/18    PT Start Time  0801    PT Stop Time  0847    PT Time Calculation (min)  46 min       History reviewed. No pertinent past medical history.  Past Surgical History:  Procedure Laterality Date  . TONSILLECTOMY      There were no vitals filed for this visit.  Subjective Assessment - 05/02/18 0801    Subjective  Pt reports she felt ok after the last session. She feels like the pain has shifted to the Rt side, flexibility is improving.     Patient Stated Goals  be able to use her joints until she is 67 yo, get exercises that will help her.     Currently in Pain?  Yes    Pain Score  2     Pain Location  Hip    Pain Orientation  Right;Lateral    Pain Descriptors / Indicators  Tightness    Pain Onset  More than a month ago    Pain Frequency  Intermittent    Aggravating Factors   lifting leg up into car etc    Pain Relieving Factors  rest         [facility] PT Assessment - 05/02/18 0001      Assessment   Medical Diagnosis  Bilat hip bursitis    Referring Provider  Dr Denyse Amass      AROM   Right Hip External Rotation   28    Right Hip Internal Rotation   61    Right Hip ABduction  48    Left Hip External Rotation   20    Left Hip Internal Rotation   50    Left Hip ABduction  44                   [facility] Adult PT Treatment/Exercise - 05/02/18 0001      Knee/Hip Exercises: Stretches   Piriformis Stretch  Both;2 reps;60 seconds modified to focus on hip rotators.       Knee/Hip Exercises: Aerobic   Nustep  L5 x 5' legs only       Manual Therapy   Manual Therapy  Joint mobilization;Manual Traction    Joint Mobilization  bilat hip AP and PA mobs grade III, mobs into ER /IR     Manual Traction  bilat legs/hips with stretch into  abduction.              PT Education - 05/02/18 [phone]    Education provided  Yes    Education Details  added a stretch    Person(s) Educated  Patient    Methods  Explanation;Handout    Comprehension  Returned demonstration;Verbalized understanding          PT Long Term Goals - 05/02/18 0803      PT LONG TERM GOAL #1   Title  I with HEP for stretching bilat hips ( 05/30/18)     Status  On-going      PT LONG TERM GOAL #  2   Title  improve bilat hip ER to allow her to sit with her legs crossed to donn shoes/socks ( 05/30/18)     Status  On-going pt reports improvement in function      PT LONG TERM GOAL #3   Title  improve bilat hip adduction to within 5 degrees from WNL ( 05/30/18)       PT LONG TERM GOAL #4   Title  report =/> 75% reduction of pain with getting in her car ( 05/30/18)     Status  On-going 50% improvement      PT LONG TERM GOAL #5   Title  improve FOTO =/< 24% limited ( 05/30/18)     Status  On-going      PT LONG TERM GOAL #6   Title  walk with upright posture without verbal cues ( 05/30/18)     Status  On-going            Plan - 05/02/18 0932    Clinical Impression Statement  Tanya Cortez is making progress to her goals.  Pain is slowly decreasing when lifting her leg into the car.  She continues to have tightness in her hips especially the posterior joint capsule and would benefit from more mobilizations to loosen them up and possibly more DN to the posterior hip.     Rehab Potential  Excellent    PT Frequency  1x / week    PT Treatment/Interventions  Iontophoresis /ml Dexamethasone;Gait training;Dry needling;Manual techniques;Moist Heat;Traction;Patient/family education;Therapeutic exercise;Cryotherapy;Electrical Stimulation;Ultrasound;Passive range of motion     PT Next Visit Plan  hip mobs, DN to Rt hip rotators, and manual work    Financial planner with Plan of Care  Patient       Patient will benefit from skilled therapeutic intervention in order to improve the following deficits and impairments:  Abnormal gait, Pain, Postural dysfunction, Increased muscle spasms, Decreased range of motion  Visit Diagnosis: Stiffness of right hip, not elsewhere classified  Stiffness of left hip, not elsewhere classified  Pain in right hip  Pain in left hip     Problem List Patient Active Problem List   Diagnosis Date Noted  . Osteopenia determined by x-ray 04/20/2018  . Tendonitis involving hip abductors 04/20/2018  . Heart murmur 05/24/2014  . Hyperlipidemia 04/22/2014  . Essential hypertension, benign 04/22/2014    Roderic Scarce PT  05/02/2018, 9:35 AM  [facility] [address] [place] [address] [place], [place], [address] Phone: [phone]   Fax:  [phone]  Name: Tanya Cortez MRN: [id number] Date of Birth: November 10, 1951

## 2018-05-10 ENCOUNTER — Ambulatory Visit: Payer: BC Managed Care – PPO | Admitting: Physical Therapy

## 2018-05-10 DIAGNOSIS — M25552 Pain in left hip: Secondary | ICD-10-CM | POA: Diagnosis not present

## 2018-05-10 DIAGNOSIS — M25652 Stiffness of left hip, not elsewhere classified: Secondary | ICD-10-CM

## 2018-05-10 DIAGNOSIS — M25551 Pain in right hip: Secondary | ICD-10-CM | POA: Diagnosis not present

## 2018-05-10 DIAGNOSIS — M25651 Stiffness of right hip, not elsewhere classified: Secondary | ICD-10-CM | POA: Diagnosis not present

## 2018-05-10 NOTE — Patient Instructions (Signed)

## 2018-05-10 NOTE — Therapy (Signed)
[facility] [place] [address] [place] [address] [place], [place], [address] Phone: [phone]   Fax:  [phone]  Physical Therapy Treatment  Patient Details  Name: Tanya Cortez MRN: [id number] Date of Birth: August 16, 1951 Referring Provider: Dr Denyse Amass   Encounter Date: 05/10/2018  PT End of Session - 05/10/18 0808    Visit Number  4    Number of Visits  6    Date for PT Re-Evaluation  05/30/18    PT Start Time  0808    PT Stop Time  0904    PT Time Calculation (min)  56 min    Activity Tolerance  Patient tolerated treatment well       No past medical history on file.  Past Surgical History:  Procedure Laterality Date  . TONSILLECTOMY      There were no vitals filed for this visit.  Subjective Assessment - 05/10/18 0808    Subjective  Trish had to take an advil on Saturday.  The pain settled into the Rt hip last week and then Sunday it was better.  Able to lift her legs easier with less pain.    Patient Stated Goals  be able to use her joints until she is 67 yo, get exercises that will help her.     Currently in Pain?  Yes    Pain Score  2     Pain Location  Hip    Pain Orientation  Left;Right    Pain Type  Chronic pain    Aggravating Factors   not sure lately.     Pain Relieving Factors  rest         [facility] PT Assessment - 05/10/18 0001      AROM   Right Hip External Rotation   26 32 after manual    Right Hip Internal Rotation   57    Left Hip External Rotation   19 24 after manual work     Left Hip Internal Rotation   50                   [facility] Adult PT Treatment/Exercise - 05/10/18 0001      Knee/Hip Exercises: Stretches   Piriformis Stretch  Both;1 rep;30 seconds figure 4 and knee to opposite shoulder      Knee/Hip Exercises: Aerobic   Nustep  L5 x 5' legs and arms      Knee/Hip Exercises: Sidelying   Clams  2x15 each reverse clams with 2#, regular clams. Each side    Other Sidelying Knee/Hip  Exercises  --      Modalities   Modalities  Electrical Stimulation;Moist Heat      Moist Heat Therapy   Number Minutes Moist Heat  15 Minutes    Moist Heat Location  -- bilat buttocks      Electrical Stimulation   Electrical Stimulation Location  buttocks    Electrical Stimulation Action  to tolerance    Electrical Stimulation Parameters  IFC    Electrical Stimulation Goals  Pain;Tone      Manual Therapy   Manual Therapy  Soft tissue mobilization    Soft tissue mobilization  STM to bilat gluts and piriformis with active release through piriformis       Trigger Point Dry Needling - 05/10/18 0813    Consent Given?  Yes    Muscles Treated Lower Body  Gluteus minimus;Gluteus maximus;Piriformis bilat    Gluteus Maximus Response  Twitch response elicited;Palpable increased muscle length  Gluteus Minimus Response  Palpable increased muscle length;Twitch response elicited    Piriformis Response  Palpable increased muscle length;Twitch response elicited                PT Long Term Goals - 05/02/18 0803      PT LONG TERM GOAL #1   Title  I with HEP for stretching bilat hips ( 05/30/18)     Status  On-going      PT LONG TERM GOAL #2   Title  improve bilat hip ER to allow her to sit with her legs crossed to donn shoes/socks ( 05/30/18)     Status  On-going pt reports improvement in function      PT LONG TERM GOAL #3   Title  improve bilat hip adduction to within 5 degrees from WNL ( 05/30/18)       PT LONG TERM GOAL #4   Title  report =/> 75% reduction of pain with getting in her car ( 05/30/18)     Status  On-going 50% improvement      PT LONG TERM GOAL #5   Title  improve FOTO =/< 24% limited ( 05/30/18)     Status  On-going      PT LONG TERM GOAL #6   Title  walk with upright posture without verbal cues ( 05/30/18)     Status  On-going            Plan - 05/10/18 0928    Clinical Impression Statement  [name] is able to lift her legs into car and for lower body  dressing with less pain.  She was very sore and tight after treatment today.  Continues with progress to goals and is pleased with her progress. Still has tightness in bilat hips movement and in muscles.     Rehab Potential  Excellent    PT Frequency  1x / week    PT Duration  6 weeks    PT Treatment/Interventions  Iontophoresis /ml Dexamethasone;Gait training;Dry needling;Manual techniques;Moist Heat;Traction;Patient/family education;Therapeutic exercise;Cryotherapy;Electrical Stimulation;Ultrasound;Passive range of motion    PT Next Visit Plan  hip mobs, DN to Rt hip rotators, and manual work -possible assess for Femoroacetabular impingement.     Consulted and Agree with Plan of Care  Patient       Patient will benefit from skilled therapeutic intervention in order to improve the following deficits and impairments:  Abnormal gait, Pain, Postural dysfunction, Increased muscle spasms, Decreased range of motion  Visit Diagnosis: Stiffness of right hip, not elsewhere classified  Stiffness of left hip, not elsewhere classified  Pain in right hip  Pain in left hip     Problem List Patient Active Problem List   Diagnosis Date Noted  . Osteopenia determined by x-ray 04/20/2018  . Tendonitis involving hip abductors 04/20/2018  . Heart murmur 05/24/2014  . Hyperlipidemia 04/22/2014  . Essential hypertension, benign 04/22/2014    Roderic Scarce PT  05/10/2018, 9:35 AM  [facility] [address] [place] [address] [place], [place], [address] Phone: [phone]   Fax:  [phone]  Name: Elverna Caffee MRN: [id number] Date of Birth: Feb 15, 1951

## 2018-05-17 ENCOUNTER — Ambulatory Visit: Payer: BC Managed Care – PPO | Admitting: Physical Therapy

## 2018-05-17 ENCOUNTER — Encounter: Payer: Self-pay | Admitting: Physical Therapy

## 2018-05-17 DIAGNOSIS — M25651 Stiffness of right hip, not elsewhere classified: Secondary | ICD-10-CM

## 2018-05-17 DIAGNOSIS — M25551 Pain in right hip: Secondary | ICD-10-CM

## 2018-05-17 DIAGNOSIS — M25652 Stiffness of left hip, not elsewhere classified: Secondary | ICD-10-CM | POA: Diagnosis not present

## 2018-05-17 DIAGNOSIS — M25552 Pain in left hip: Secondary | ICD-10-CM

## 2018-05-17 NOTE — Therapy (Signed)
[place], [place], [address] Phone: [phone]   Fax:  [phone]  Physical Therapy Treatment  Patient Details  Name: Tanya Cortez MRN: [id number] Date of Birth: 1951-03-02 Referring Provider: Dr Georgina Snell   Encounter Date: 05/17/2018  Cortez End of Session - 05/17/18 0800    Visit Number  5    Number of Visits  6    Date for Cortez Re-Evaluation  05/30/18    Cortez Start Time  0800    Cortez Stop Time  0906    Cortez Time Calculation (min)  66 min    Activity Tolerance  Patient tolerated treatment well       History reviewed. No pertinent past medical history.  Past Surgical History:  Procedure Laterality Date  . TONSILLECTOMY      There were no vitals filed for this visit.  Subjective Assessment - 05/17/18 0801    Subjective  Tanya Cortez reports her hips are a lot better , her discomfort has changed and is lateral hips and knees. Cortez states she played golf on Sunday and her game has improved because she can move her hips more.     Patient Stated Goals  be able to use her joints until she is 67 yo, get exercises that will help her.     Currently in Pain?  -- Cortez reports no pain, just dull discomfort in lateral hips         [facility] Cortez Assessment - 05/17/18 0001      Assessment   Medical Diagnosis  Bilat hip bursitis    Referring Provider  Dr Georgina Snell      AROM   Right Hip External Rotation   45    Right Hip ABduction  58    Left Hip External Rotation   28    Left Hip ABduction  45    Lumbar Extension  75% presents     Lumbar - Right Side Bend  to top of knee    Lumbar - Left Side Bend  to top of knee                   [facility] Adult Cortez Treatment/Exercise - 05/17/18 0001      Knee/Hip Exercises: Stretches   ITB Stretch  Both;60 seconds with strap, reviewed form, Cortez not lifitng high enough      Knee/Hip Exercises: Aerobic   Nustep  L5 x 5' legs       Knee/Hip Exercises: Standing   SLS  2x10 FWD  leans each side, Cortez required occassional hand touch  for balance    Other Standing Knee Exercises  2x10 single leg sit to stand each side, mat mid thigh      Modalities   Modalities  Electrical Stimulation;Moist Heat      Moist Heat Therapy   Number Minutes Moist Heat  15 Minutes    Moist Heat Location  -- Rt lateral hip      Electrical Stimulation   Electrical Stimulation Location  Rt latera l hoe    Electrical Stimulation Action  to tolerance IFC    Electrical Stimulation Goals  Pain;Tone      Manual Therapy   Manual Therapy  Soft tissue mobilization    Soft tissue mobilization  STM to bilat gluts and piriformis with active release through piriformis    Manual Traction  bilat legs/hips with stretch into  abduction.        Trigger Point Dry  Needling - 05/17/18 7867    Consent Given?  Yes    Education Handout Provided  No    Muscles Treated Lower Body  Gluteus maximus;Piriformis Rt    Gluteus Maximus Response  Palpable increased muscle length;Twitch response elicited    Piriformis Response  Palpable increased muscle length;Twitch response elicited                Cortez Long Term Goals - 05/17/18 0803      Cortez LONG TERM GOAL #1   Title  I with HEP for stretching bilat hips ( 05/30/18)     Status  Achieved      Cortez LONG TERM GOAL #2   Title  improve bilat hip ER to allow her to sit with her legs crossed to donn shoes/socks ( 05/30/18)     Status  Partially Met Lt good, rt improving      Cortez LONG TERM GOAL #3   Title  improve bilat hip adduction to within 5 degrees from WNL ( 05/30/18)     Status  On-going      Cortez LONG TERM GOAL #4   Title  report =/> 75% reduction of pain with getting in her car ( 05/30/18)     Status  Achieved      Cortez LONG TERM GOAL #5   Title  improve FOTO =/< 24% limited ( 05/30/18)     Status  On-going      Cortez LONG TERM GOAL #6   Title  walk with upright posture without verbal cues ( 05/30/18)     Status  Achieved            Plan - 05/17/18  0818    Clinical Impression Statement  Tanya Cortez continues to improve, She has met some goals over the last week and is progressing to the others. She still has some stiffness and pain in her hips however it it much improved.  She would benifit from more therapy to continue to address this.     Rehab Potential  Excellent    Cortez Frequency  1x / week    Cortez Duration  6 weeks    Cortez Treatment/Interventions  Iontophoresis 49m/ml Dexamethasone;Gait training;Dry needling;Manual techniques;Moist Heat;Traction;Patient/family education;Therapeutic exercise;Cryotherapy;Electrical Stimulation;Ultrasound;Passive range of motion    Cortez Next Visit Plan FOTO and assess hip mobs, DN to Rt hip rotators, and manual work -possible assess for Femoroacetabular impingement.     Consulted and Agree with Plan of Care  Patient       Patient will benefit from skilled therapeutic intervention in order to improve the following deficits and impairments:  Abnormal gait, Pain, Postural dysfunction, Increased muscle spasms, Decreased range of motion  Visit Diagnosis: Stiffness of right hip, not elsewhere classified  Stiffness of left hip, not elsewhere classified  Pain in right hip  Pain in left hip     Problem List Patient Active Problem List   Diagnosis Date Noted  . Osteopenia determined by x-ray 04/20/2018  . Tendonitis involving hip abductors 04/20/2018  . Heart murmur 05/24/2014  . Hyperlipidemia 04/22/2014  . Essential hypertension, benign 04/22/2014    Tanya Cortez  05/17/2018, 9:43 AM  [facility] [address]Phone: [phone]  Fax:  [phone] Name: PShonna DeiterMRN: [id number] of Birth: 112-Aug-1952

## 2018-05-23 ENCOUNTER — Encounter: Payer: BC Managed Care – PPO | Admitting: Physical Therapy

## 2018-05-25 ENCOUNTER — Encounter: Payer: Self-pay | Admitting: Physical Therapy

## 2018-05-25 ENCOUNTER — Ambulatory Visit: Payer: BC Managed Care – PPO | Admitting: Physical Therapy

## 2018-05-25 ENCOUNTER — Encounter: Payer: BC Managed Care – PPO | Admitting: Physical Therapy

## 2018-05-25 DIAGNOSIS — M25651 Stiffness of right hip, not elsewhere classified: Secondary | ICD-10-CM | POA: Diagnosis not present

## 2018-05-25 DIAGNOSIS — M25652 Stiffness of left hip, not elsewhere classified: Secondary | ICD-10-CM

## 2018-05-25 DIAGNOSIS — M25552 Pain in left hip: Secondary | ICD-10-CM | POA: Diagnosis not present

## 2018-05-25 DIAGNOSIS — M25551 Pain in right hip: Secondary | ICD-10-CM | POA: Diagnosis not present

## 2018-05-25 NOTE — Therapy (Signed)
[place], [place], [address] Phone: [phone]   Fax:  [phone]  Physical Therapy Treatment  Patient Details  Name: Tanya Cortez MRN: [id number] Date of Birth: 07-Dec-1951 Referring Provider: Dr Georgina Snell   Encounter Date: 05/25/2018  PT End of Session - 05/25/18 0842    Visit Number  6    Number of Visits  12    Date for PT Re-Evaluation  07/06/18    PT Start Time  0844    PT Stop Time  0950    PT Time Calculation (min)  66 min    Activity Tolerance  Patient tolerated treatment well       History reviewed. No pertinent past medical history.  Past Surgical History:  Procedure Laterality Date  . TONSILLECTOMY      There were no vitals filed for this visit.  Subjective Assessment - 05/25/18 0843    Subjective  Doing good, discomfort in the back is gone and it is now in the front.     Patient Stated Goals  be able to use her joints until she is 67 yo, get exercises that will help her.     Currently in Pain?  Yes    Pain Score  2     Pain Location  Groin    Pain Orientation  Left;Right    Pain Descriptors / Indicators  Tightness;Heaviness    Pain Type  Chronic pain    Pain Onset  More than a month ago    Pain Frequency  Intermittent    Aggravating Factors   walking, lifting leg into hip flexion    Pain Relieving Factors  rest         [facility] PT Assessment - 05/25/18 0001      Assessment   Medical Diagnosis  Bilat hip bursitis    Referring Provider  Dr Georgina Snell    Onset Date/Surgical Date  04/19/15      Observation/Other Assessments   Focus on Therapeutic Outcomes (FOTO)   35% limited       AROM   Right Hip External Rotation   32    Right Hip Internal Rotation   64    Right Hip ABduction  40    Left Hip External Rotation   20    Left Hip Internal Rotation   48    Left Hip ABduction  45    Lumbar Flexion  to floor    Lumbar Extension  WNL    Lumbar - Right Side Bend  to top  of knee - with tightness    Lumbar - Left Side Bend  to mid knee    Lumbar - Right Rotation  WNL    Lumbar - Left Rotation  WNL      Strength   Right/Left Hip  -- WNLexcept Rt hip ext 5-/5    Right/Left Knee  -- WNL                   [facility] Adult PT Treatment/Exercise - 05/25/18 0001      Knee/Hip Exercises: Stretches   Other Knee/Hip Stretches  butterfly stretch.       Knee/Hip Exercises: Aerobic   Nustep  L5 x 5' legs       Modalities   Modalities  Moist Heat;Electrical Stimulation      Moist Heat Therapy   Number Minutes Moist Heat  15 Minutes    Moist Heat Location  -- bilat inner  thighs and pelvis      Electrical Stimulation   Electrical Stimulation Location  inner thighs    Electrical Stimulation Action  premod    Electrical Stimulation Parameters  to tolerance    Electrical Stimulation Goals  Pain;Tone      Manual Therapy   Manual Therapy  Soft tissue mobilization    Soft tissue mobilization  STM to bilat inner thighs/adductors and iliacus    Manual Traction  bilat legs/hips with stretch into  abduction.        Trigger Point Dry Needling - 05/25/18 0916    Consent Given?  Yes    Education Handout Provided  No    Muscles Treated Lower Body  Adductor longus/brevius/maximus iliacus bilat with good twitch release    Adductor Response  Palpable increased muscle length;Twitch response elicited bilat with stim                PT Long Term Goals - 05/25/18 9509      PT LONG TERM GOAL #1   Title  I with HEP for stretching bilat hips ( 05/30/18)     Status  Achieved      PT LONG TERM GOAL #2   Title  improve bilat hip ER to allow her to sit with her legs crossed to donn shoes/socks ( 07/06/18)     Time  6    Period  Weeks    Status  Partially Met      PT LONG TERM GOAL #3   Title  improve bilat hip adduction to within 5 degrees from WNL ( 07/06/18)     Time  6    Period  Weeks    Status  On-going      PT LONG TERM GOAL #4   Title  report  =/> 75% reduction of pain with getting in her car ( 05/30/18)     Status  Achieved      PT LONG TERM GOAL #5   Title  improve FOTO =/< 24% limited ( 06/1118)     Time  6    Period  Weeks    Status  On-going scored 35% limited      PT LONG TERM GOAL #6   Title  walk with upright posture without verbal cues ( 05/30/18)     Status  Achieved            Plan - 05/25/18 0925    Clinical Impression Statement  Tanya Cortez continues to make improvement.  Her pain is moving out of the back and into her hips/groin. Her strength is good however she has stiffness in her hip rotation and abduction. Sh ewould benefit from continued treatment to improve hiop ROM and restore painfree activities.  Goals partially met.  Her FOTO score is lower however this is most likely due to her being more aware of her restrictions.     Rehab Potential  Excellent    PT Frequency  1x / week    PT Duration  6 weeks    PT Treatment/Interventions  Iontophoresis 41m/ml Dexamethasone;Gait training;Dry needling;Manual techniques;Moist Heat;Traction;Patient/family education;Therapeutic exercise;Cryotherapy;Electrical Stimulation;Ultrasound;Passive range of motion    PT Next Visit Plan  hip mobs/ROM/manual therapy and modalities PRN    Consulted and Agree with Plan of Care  Patient       Patient will benefit from skilled therapeutic intervention in order to improve the following deficits and impairments:  Abnormal gait, Pain, Postural dysfunction, Increased muscle spasms, Decreased range of motion  Visit Diagnosis: Stiffness of right hip, not elsewhere classified - Plan: PT plan of care cert/re-cert  Stiffness of left hip, not elsewhere classified - Plan: PT plan of care cert/re-cert  Pain in right hip - Plan: PT plan of care cert/re-cert  Pain in left hip - Plan: PT plan of care cert/re-cert     Problem List Patient Active Problem List   Diagnosis Date Noted  . Osteopenia determined by x-ray 04/20/2018  . Tendonitis  involving hip abductors 04/20/2018  . Heart murmur 05/24/2014  . Hyperlipidemia 04/22/2014  . Essential hypertension, benign 04/22/2014    Jeral Pinch PT 05/25/2018, 10:03 AM  [facility] [place], [place], [address] Phone: [phone]   Fax:  [phone]  Name: Tanya Cortez MRN: [id number] Date of Birth: December 08, 1951

## 2018-05-31 ENCOUNTER — Encounter: Payer: Self-pay | Admitting: Physical Therapy

## 2018-05-31 ENCOUNTER — Ambulatory Visit: Payer: BC Managed Care – PPO | Admitting: Physical Therapy

## 2018-05-31 DIAGNOSIS — M25551 Pain in right hip: Secondary | ICD-10-CM

## 2018-05-31 DIAGNOSIS — M25651 Stiffness of right hip, not elsewhere classified: Secondary | ICD-10-CM | POA: Diagnosis not present

## 2018-05-31 DIAGNOSIS — M25652 Stiffness of left hip, not elsewhere classified: Secondary | ICD-10-CM | POA: Diagnosis not present

## 2018-05-31 DIAGNOSIS — M25552 Pain in left hip: Secondary | ICD-10-CM | POA: Diagnosis not present

## 2018-05-31 NOTE — Therapy (Signed)
[place], [place], [address] Phone: [phone]   Fax:  [phone]  Physical Therapy Treatment  Patient Details  Name: Tanya Cortez MRN: [id number] Date of Birth: 21-Jul-1951 Referring Provider: Dr Georgina Snell   Encounter Date: 05/31/2018  PT End of Session - 05/31/18 0757    Visit Number  7    Number of Visits  12    Date for PT Re-Evaluation  07/06/18    PT Start Time  0757    PT Stop Time  0904    PT Time Calculation (min)  67 min    Activity Tolerance  Patient tolerated treatment well       History reviewed. No pertinent past medical history.  Past Surgical History:  Procedure Laterality Date  . TONSILLECTOMY      There were no vitals filed for this visit.  Subjective Assessment - 05/31/18 0758    Subjective  Pt reports her groin pain is improved.  Rt side has less burning, the Rt leg feels heavy when lifting still. Lt side groin is still sore however improved. Moved to lateral hip.     Currently in Pain?  Yes    Pain Score  2     Pain Location  Hip    Pain Orientation  Left;Lateral    Pain Descriptors / Indicators  Aching    Pain Type  Chronic pain    Pain Onset  More than a month ago    Pain Frequency  Intermittent    Aggravating Factors   lifting leg into hip flexion     Pain Relieving Factors  rest                       [facility] Adult PT Treatment/Exercise - 05/31/18 0001      Knee/Hip Exercises: Stretches   Quad Stretch  Both;60 seconds POE with strap and knee lifted on cushion    Hip Flexor Stretch  Both;60 seconds in high kneel    Piriformis Stretch  Both;60 seconds with over pressure      Knee/Hip Exercises: Aerobic   Nustep  L5 x 5' legs       Knee/Hip Exercises: Standing   Other Standing Knee Exercises  2x10 single leg sit to stand each side, mat mid thigh    Other Standing Knee Exercises  10 reps high kneel to/from stand each side.       Knee/Hip  Exercises: Supine   Other Supine Knee/Hip Exercises  2x10 resisted press/pull into hip flex/ext with red band, keeping core tight      Modalities   Modalities  Moist Heat;Electrical Stimulation      Moist Heat Therapy   Number Minutes Moist Heat  15 Minutes    Moist Heat Location  Hip bilat anterior and buttocks      Electrical Stimulation   Electrical Stimulation Location  Lt buttocks    Electrical Stimulation Action  IFC    Electrical Stimulation Parameters  to tolerance    Electrical Stimulation Goals  Pain;Tone      Manual Therapy   Manual Therapy  Soft tissue mobilization    Soft tissue mobilization  STM to Lt gluts/piriformis       Trigger Point Dry Needling - 05/31/18 0828    Consent Given?  Yes    Education Handout Provided  No    Muscles Treated Lower Body  Gluteus maximus;Piriformis    Gluteus Maximus Response  --  lt    Piriformis Response  Palpable increased muscle length;Twitch response elicited Lt            PT Education - 05/31/18 6606    Education provided  Yes    Education Details  HEP    Person(s) Educated  Patient    Methods  Explanation;Handout    Comprehension  Returned demonstration;Verbalized understanding          PT Long Term Goals - 05/31/18 0801      PT LONG TERM GOAL #1   Title  I with HEP for stretching bilat hips ( 05/30/18)     Status  Achieved      PT LONG TERM GOAL #2   Title  improve bilat hip ER to allow her to sit with her legs crossed to donn shoes/socks ( 07/06/18)     Status  Partially Met      PT LONG TERM GOAL #3   Title  improve bilat hip adduction to within 5 degrees from WNL ( 07/06/18)     Status  On-going      PT LONG TERM GOAL #4   Title  report =/> 75% reduction of pain with getting in her car ( 05/30/18)     Status  Achieved      PT LONG TERM GOAL #5   Title  improve FOTO =/< 24% limited ( 06/1118)     Status  On-going      PT LONG TERM GOAL #6   Title  walk with upright posture without verbal cues (  05/30/18)     Status  Achieved            Plan - 05/31/18 0909    Clinical Impression Statement  [name] is doing very well with the Rt hip/low back.  Having issues only with the left side today.  Continues to make progress to her goals and in functional acitivites out side of rehab. Still needs strengthening of the hips/core. and manual work to the Rt hip joint     Rehab Potential  Excellent    PT Frequency  1x / week    PT Duration  6 weeks    PT Treatment/Interventions  Iontophoresis 51m/ml Dexamethasone;Gait training;Dry needling;Manual techniques;Moist Heat;Traction;Patient/family education;Therapeutic exercise;Cryotherapy;Electrical Stimulation;Ultrasound;Passive range of motion    PT Next Visit Plan  hip mobs/stretching Rt     Consulted and Agree with Plan of Care  Patient       Patient will benefit from skilled therapeutic intervention in order to improve the following deficits and impairments:  Abnormal gait, Pain, Postural dysfunction, Increased muscle spasms, Decreased range of motion  Visit Diagnosis: Stiffness of right hip, not elsewhere classified  Stiffness of left hip, not elsewhere classified  Pain in right hip  Pain in left hip     Problem List Patient Active Problem List   Diagnosis Date Noted  . Osteopenia determined by x-ray 04/20/2018  . Tendonitis involving hip abductors 04/20/2018  . Heart murmur 05/24/2014  . Hyperlipidemia 04/22/2014  . Essential hypertension, benign 04/22/2014    SJeral PinchPT  05/31/2018, 9:15 AM  [facility] [address]Phone: [phone]  Fax:  [phone] Name: Tanya LemireMRN: [id number] of Birth: [birth year]

## 2018-06-07 ENCOUNTER — Encounter: Payer: Self-pay | Admitting: Physical Therapy

## 2018-06-07 ENCOUNTER — Ambulatory Visit: Payer: BC Managed Care – PPO | Admitting: Physical Therapy

## 2018-06-07 DIAGNOSIS — M25651 Stiffness of right hip, not elsewhere classified: Secondary | ICD-10-CM | POA: Diagnosis not present

## 2018-06-07 DIAGNOSIS — M25551 Pain in right hip: Secondary | ICD-10-CM | POA: Diagnosis not present

## 2018-06-07 DIAGNOSIS — M25652 Stiffness of left hip, not elsewhere classified: Secondary | ICD-10-CM | POA: Diagnosis not present

## 2018-06-07 DIAGNOSIS — M25552 Pain in left hip: Secondary | ICD-10-CM

## 2018-06-07 NOTE — Therapy (Signed)
[place], [place], [address] Phone: [phone]   Fax:  [phone]  Physical Therapy Treatment  Patient Details  Name: Tanya Cortez MRN: [id number] Date of Birth: Aug 31, 1951 Referring Provider: Dr Georgina Snell   Encounter Date: 06/07/2018  PT End of Session - 06/07/18 0755    Visit Number  8    Number of Visits  12    Date for PT Re-Evaluation  07/06/18    PT Start Time  0755    PT Stop Time  0853    PT Time Calculation (min)  58 min    Activity Tolerance  Patient tolerated treatment well       History reviewed. No pertinent past medical history.  Past Surgical History:  Procedure Laterality Date  . TONSILLECTOMY      There were no vitals filed for this visit.  Subjective Assessment - 06/07/18 0756    Subjective  Trish states that SAturday she was very sore and since then it has backed off. 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     Currently in Pain?  No/denies         [facility] PT Assessment - 06/07/18 0001      Assessment   Medical Diagnosis  Bilat hip bursitis    Referring Provider  Dr Georgina Snell    Onset Date/Surgical Date  04/19/15      AROM   Right Hip External Rotation   45    Left Hip External Rotation   28    Left Hip Internal Rotation   48                   [facility] Adult PT Treatment/Exercise - 06/07/18 0001      Knee/Hip Exercises: Stretches   Passive Hamstring Stretch  Both;30 seconds    Piriformis Stretch  Both;30 seconds    Other Knee/Hip Stretches  SKTC x 30 sec both      Knee/Hip Exercises: Aerobic   Nustep  L5 x 5' legs       Knee/Hip Exercises: Sidelying   Other Sidelying Knee/Hip Exercises  3x30 sec side plank on elbows each side.       Knee/Hip Exercises: Prone    Other Prone Exercises  quadruped, green band leg presses, hamstring curls 3x10 each, VC for alignment.  Lt side a lot harder for pt      Manual Therapy   Manual Therapy  Joint mobilization;Passive ROM    Joint Mobilization  bilat hip mobs in prone,     Passive ROM  bilat hips ER/IR                  PT Long Term Goals - 06/07/18 0759      PT LONG TERM GOAL #1   Title  I with HEP for stretching bilat hips ( 05/30/18)     Status  Achieved      PT LONG TERM GOAL #2   Title  improve bilat hip ER to allow her to sit with her legs crossed to donn shoes/socks ( 07/06/18)     Status  Partially Met intermittent ease with this motion now.       PT LONG TERM GOAL #3   Title  improve bilat hip adduction to within 5 degrees from WNL ( 07/06/18)     Status  On-going      PT LONG TERM GOAL #4   Title  report =/> 75% reduction of pain with getting in her car ( 05/30/18)     Status  Achieved      PT LONG TERM GOAL #5   Title  improve FOTO =/< 24% limited ( 06/1118)     Status  On-going      PT LONG TERM GOAL #6   Title  walk with upright posture without verbal cues ( 05/30/18)     Status  Achieved            Plan - 06/07/18 0849    Clinical Impression Statement  Trish's hip rotation continues to improve Rt is WNL and Lt slowly getting there.  She is having groin pain/pinching when she brings the femur past 90 degrees flex and hip rotator pain Lt with crossing the legs.  Would beneift from continued tx to restore ROM and decrease pain to allow her to cross legs , donn/doff socks and shoes and walk    Rehab Potential  Excellent    PT Frequency  1x / week    PT Duration  6 weeks    PT Treatment/Interventions  Iontophoresis 70m/ml Dexamethasone;Gait training;Dry needling;Manual techniques;Moist Heat;Traction;Patient/family education;Therapeutic exercise;Cryotherapy;Electrical Stimulation;Ultrasound;Passive range of motion    PT Next Visit Plan  DN into hip flexors and possible  adductors and lumbar mobs/ hip stretching    Consulted and Agree with Plan of Care  Patient       Patient will benefit from skilled therapeutic intervention in order to improve the following deficits and impairments:  Abnormal gait, Pain, Postural dysfunction, Increased muscle spasms, Decreased range of motion  Visit Diagnosis: Stiffness of right hip, not elsewhere classified  Stiffness of left hip, not elsewhere classified  Pain in right hip  Pain in left hip     Problem List Patient Active Problem List   Diagnosis Date Noted  . Osteopenia determined by x-ray 04/20/2018  . Tendonitis involving hip abductors 04/20/2018  . Heart murmur 05/24/2014  . Hyperlipidemia 04/22/2014  . Essential hypertension, benign 04/22/2014    SJeral PinchPT  06/07/2018, 8:58 AM  [facility] [address]Phone: [phone]  Fax:  [phone] Name: Tanya AzadMRN: [id number] of Birth: 1Nov 14, 1952

## 2018-06-13 ENCOUNTER — Ambulatory Visit: Payer: BC Managed Care – PPO | Admitting: Family Medicine

## 2018-06-13 ENCOUNTER — Encounter: Payer: Self-pay | Admitting: Family Medicine

## 2018-06-13 VITALS — BP 132/76 | HR 75 | Ht 64.0 in | Wt 139.0 lb

## 2018-06-13 DIAGNOSIS — M160 Bilateral primary osteoarthritis of hip: Secondary | ICD-10-CM | POA: Diagnosis not present

## 2018-06-13 DIAGNOSIS — I10 Essential (primary) hypertension: Secondary | ICD-10-CM

## 2018-06-13 DIAGNOSIS — M949 Disorder of cartilage, unspecified: Secondary | ICD-10-CM | POA: Diagnosis not present

## 2018-06-13 DIAGNOSIS — M76899 Other specified enthesopathies of unspecified lower limb, excluding foot: Secondary | ICD-10-CM

## 2018-06-13 DIAGNOSIS — M8580 Other specified disorders of bone density and structure, unspecified site: Secondary | ICD-10-CM

## 2018-06-13 DIAGNOSIS — M169 Osteoarthritis of hip, unspecified: Secondary | ICD-10-CM

## 2018-06-13 DIAGNOSIS — E7800 Pure hypercholesterolemia, unspecified: Secondary | ICD-10-CM | POA: Diagnosis not present

## 2018-06-13 DIAGNOSIS — M899 Disorder of bone, unspecified: Secondary | ICD-10-CM | POA: Diagnosis not present

## 2018-06-13 HISTORY — DX: Osteoarthritis of hip, unspecified: M16.9

## 2018-06-13 NOTE — Patient Instructions (Addendum)
Thank you for coming in today. Continue exercises.  Let me know if I need to re-authorize it.  We will check calcium and vitamin D as well as cholesterol.  Recheck with me as needed.   Labs should be done fasting.   Alendronate tablets What is this medicine? ALENDRONATE (a LEN droe nate) slows calcium loss from bones. It helps to make normal healthy bone and to slow bone loss in people with Paget's disease and osteoporosis. It may be used in others at risk for bone loss. This medicine may be used for other purposes; ask your health care provider or pharmacist if you have questions. COMMON BRAND NAME(S): Fosamax What should I tell my health care provider before I take this medicine? They need to know if you have any of these conditions: -dental disease -esophagus, stomach, or intestine problems, like acid reflux or GERD -kidney disease -low blood calcium -low vitamin D -problems sitting or standing 30 minutes -trouble swallowing -an unusual or allergic reaction to alendronate, other medicines, foods, dyes, or preservatives -pregnant or trying to get pregnant -breast-feeding How should I use this medicine? You must take this medicine exactly as directed or you will lower the amount of the medicine you absorb into your body or you may cause yourself harm. Take this medicine by mouth first thing in the morning, after you are up for the day. Do not eat or drink anything before you take your medicine. Swallow the tablet with a full glass (6 to 8 fluid ounces) of plain water. Do not take this medicine with any other drink. Do not chew or crush the tablet. After taking this medicine, do not eat breakfast, drink, or take any medicines or vitamins for at least 30 minutes. Sit or stand up for at least 30 minutes after you take this medicine; do not lie down. Do not take your medicine more often than directed. Talk to your pediatrician regarding the use of this medicine in children. Special care may  be needed. Overdosage: If you think you have taken too much of this medicine contact a poison control center or emergency room at once. NOTE: This medicine is only for you. Do not share this medicine with others. What if I miss a dose? If you miss a dose, do not take it later in the day. Continue your normal schedule starting the next morning. Do not take double or extra doses. What may interact with this medicine? -aluminum hydroxide -antacids -aspirin -calcium supplements -drugs for inflammation like ibuprofen, naproxen, and others -iron supplements -magnesium supplements -vitamins with minerals This list may not describe all possible interactions. Give your health care provider a list of all the medicines, herbs, non-prescription drugs, or dietary supplements you use. Also tell them if you smoke, drink alcohol, or use illegal drugs. Some items may interact with your medicine. What should I watch for while using this medicine? Visit your doctor or health care professional for regular checks ups. It may be some time before you see benefit from this medicine. Do not stop taking your medicine except on your doctor's advice. Your doctor or health care professional may order blood tests and other tests to see how you are doing. You should make sure you get enough calcium and vitamin D while you are taking this medicine, unless your doctor tells you not to. Discuss the foods you eat and the vitamins you take with your health care professional. Some people who take this medicine have severe bone, joint, and/or muscle  pain. This medicine may also increase your risk for a broken thigh bone. Tell your doctor right away if you have pain in your upper leg or groin. Tell your doctor if you have any pain that does not go away or that gets worse. This medicine can make you more sensitive to the sun. If you get a rash while taking this medicine, sunlight may cause the rash to get worse. Keep out of the sun. If  you cannot avoid being in the sun, wear protective clothing and use sunscreen. Do not use sun lamps or tanning beds/booths. What side effects may I notice from receiving this medicine? Side effects that you should report to your doctor or health care professional as soon as possible: -allergic reactions like skin rash, itching or hives, swelling of the face, lips, or tongue -black or tarry stools -bone, muscle or joint pain -changes in vision -chest pain -heartburn or stomach pain -jaw pain, especially after dental work -pain or trouble when swallowing -redness, blistering, peeling or loosening of the skin, including inside the mouth Side effects that usually do not require medical attention (report to your doctor or health care professional if they continue or are bothersome): -changes in taste -diarrhea or constipation -eye pain or itching -headache -nausea or vomiting -stomach gas or fullness This list may not describe all possible side effects. Call your doctor for medical advice about side effects. You may report side effects to FDA at 1-800-FDA-1088. Where should I keep my medicine? Keep out of the reach of children. Store at room temperature of 15 and 30 degrees C (59 and 86 degrees F). Throw away any unused medicine after the expiration date. NOTE: This sheet is a summary. It may not cover all possible information. If you have questions about this medicine, talk to your doctor, pharmacist, or health care provider.  2018 [employer]/Gold Standard (2011-06-11 08:56:09)

## 2018-06-13 NOTE — Progress Notes (Signed)
Tanya Plateatricia Cortez is a 67 y.o. female who presents to [facility] today for follow up bilateral hip pain.   Trish notes pain at the bilateral posterior lateral and anterior hips.  This is thought to be due to DJD and hip abductor and rotator tendinopathy.  She received physical therapy from April until June 12.  She notes significant improvement in pain.  She continues to experience some posterior hip/low back pain as well as some mild anterior hip pain.  She is very happy with how things are going but would like to continue physical therapy.  Additionally patient notes a history of osteopenia.  She is worried about progression to osteoporosis.  She had a bone density test in 2017 that showed a T score of -2.1.  She takes calcium and vitamin D but is interested in learning more and perhaps taking bisphosphonates.  Additionally if patient is getting labs she would like to reassess her hyperlipidemia.  She takes atorvastatin daily with no significant muscle aches or pains.    ROS:  As above  Exam:  BP 132/76   Pulse 75   Ht 5\' 4"  (1.626 m)   Wt 139 lb (63 kg)   BMI 23.86 kg/m  General: Well Developed, well nourished, and in no acute distress.  Neuro/Psych: Alert and oriented x3, extra-ocular muscles intact, able to move all 4 extremities, sensation grossly intact. Skin: Warm and dry, no rashes noted.  Respiratory: Not using accessory muscles, speaking in full sentences, trachea midline.  Cardiovascular: Pulses palpable, no extremity edema. Abdomen: Does not appear distended. MSK:  Hips bilaterally have normal motion.  Normal gait.  Patient can stand from a seated position easily.    Lab and Radiology Results EXAM: PELVIS - 1-2 VIEW  COMPARISON:  None.  FINDINGS: There is moderate degenerative joint disease of both hips with some loss of joint space, sclerosis, and spurring. No acute fracture is seen. The pelvic rami are intact.  The SI joints are corticated. There is degenerative change also noted at the L4-5 level.  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 No acute abnormality.   Electronically Signed   By: Dwyane DeePaul  Barry M.D.   On: 04/18/2018 16:29 I personally (independently) visualized and performed the interpretation of the images attached in this note.   EXAM: DUAL X-RAY ABSORPTIOMETRY (DXA) FOR BONE MINERAL DENSITY  IMPRESSION: Referring Physician:  Barbarann EhlersATHERINE D METHENEY PATIENT: Name: Tanya PlateGainey, Romeka Patient ID: [id number] Birth Date: October 26, 1951 Height: 63.5 in. Sex: Female Measured: 08/11/2016 Weight: 133.0 lbs. Indications: Caucasian, Estrogen Deficiency, Low Calcium Intake, Postmenopausal Fractures: Treatments:  ASSESSMENT: The BMD measured at Femur Total Left is 0.746 g/cm2 with a T-score of -2.1.  This patient is considered OSTEOPENIC according to World Health Organization [facility](WHO) criteria.  L-4 was excluded due to degenerative changes.  Site Region Measured Date Measured Age WHO YA BMD Classification T-score AP Spine L1-L3 08[age]11/2016 66.5 Osteopenia -1.8 0.956 g/cm2  DualFemur Total Left 7/11/2016 [age] Osteopenia -2.1 0.746 g/cm2  World Health [facility](WHO) criteria for post-menopausal, Caucasian Women: Normal       T-score at or above -1 SD Osteopenia   T-score between -1 and -2.5 SD Osteoporosis T-score at or below -2.5 SD  RECOMMENDATION: National Osteoporosis Foundation recommends that FDA-approved medical therapies be considered in postmenopausal women and men age 68 or older with a: 1. Hip or vertebral (clinical or morphometric) fracture. 2. T-score of <-2.5 at the spine or hip. 3. Ten-year fracture probability  by FRAX of 3% or greater for hip fracture or 20% or greater for major osteoporotic fracture.  All treatments decisions require clinical judgment and consideration of individual patient factors, including patient  preferences, co-morbidities, previous drug use, risk factors not captured in the FRAX model (e.g. falls, vitamin D deficiency, increased bone turnover, interval significant decline in bone density) and possible under - or over-estimation of fracture risk by FRAX.  All patients should ensure an adequate intake of dietary calcium (1200 mg/d) and vitamin D (800 IU daily) unless contraindicated.  FOLLOW-UP: People with diagnosed cases of osteoporosis or at high risk for fracture should have regular bone mineral density tests. For patients eligible for Medicare, routine testing is allowed once every 2 years. The testing frequency can be increased to one year for patients who have rapidly progressing disease, those who are receiving or discontinuing medical therapy to restore bone mass, or have additional risk factors.  I have reviewed this report and agree with the above findings.  Mark A. Tyron Russell, M.D.  [facility]  Patient: Tanya Plate Referring Physician: Barbarann Ehlers METHENEY Birth Date: 03/16/1951 [age] Patient ID: [id number] Height: 63.5 in. Weight: 133.0 lbs. Measured: 08/11/2016 1:17:29 PM (16 SP 2) Sex: Female Ethnicity: White Analyzed: 08/11/2016 1:26:50 PM (16 SP 2)  FRAX* 10-year Probability of Fracture Based on femoral neck BMD: DualFemur (Right)  Major Osteoporotic Fracture: 10.8% Hip Fracture:                1.8% Population:                  [place] (Caucasian) Risk Factors:                None  *FRAX is a Armed forces logistics/support/administrative officer of the [employer] for Metabolic Bone Disease, a World Science writer (WHO) [employer].  ASSESSMENT: The probability of a major osteoporotic fracture is 10.8% within the next ten years. The probability of a hip fracture is 1.8% within the next ten years.  I have reviewed this report and agree with the above findings.  Mark A. Tyron Russell, M.D.  [facility]  Radiology   Electronically Signed   By: Ulyses Southward M.D.   On: 08/11/2016 13:44  Lab Results  Component Value Date   CHOL 190 08/02/2017   HDL 79 08/02/2017   LDLCALC 182 (H) 06/23/2016   TRIG 74 08/02/2017   CHOLHDL 2.4 08/02/2017     Assessment and Plan: 67 y.o. female with  Hip pain: Due to DJD and tendinopathy.  Plan to continue physical therapy.  If patient reaches maximum medical improvement and is still having pain next step would be considered ultrasound-guided injection.  Recheck with me as needed.  Osteopenia: We will recheck calcium and vitamin D levels with CMP and vitamin D today.  Consider bisphosphonates after labs are back.  Hyperlipidemia: As we will be obtaining labs will recheck lipid panel and metabolic panel.  Additionally will recheck metabolic panel for hypertension follow-up as well.  Will send results to PCP.    Orders Placed This Encounter  Procedures  . COMPLETE METABOLIC PANEL WITH GFR  . VITAMIN D 25 Hydroxy (Vit-D Deficiency, Fractures)  . Lipid Panel w/reflex Direct LDL  . Ambulatory referral to Physical Therapy    Referral Priority:   Routine    Referral Type:   Physical Medicine    Referral Reason:   Specialty Services Required    Requested Specialty:  Physical Therapy   No orders of the defined types were placed in this encounter.   Historical information moved to improve visibility of documentation.  Past Medical History:  Diagnosis Date  . Degenerative joint disease (DJD) of hip 06/13/2018  . Essential hypertension, benign 04/22/2014  . Hyperlipidemia 04/22/2014  . Osteopenia determined by x-ray 04/20/2018   T score -2.1 on Dexa scan 2017   Past Surgical History:  Procedure Laterality Date  . TONSILLECTOMY     Social History   Tobacco Use  . Smoking status: Never Smoker  . Smokeless tobacco: Never Used  Substance Use Topics  . Alcohol use: No   family history includes Breast cancer in her mother; COPD in her mother;  Heart attack in her father and maternal grandfather; Hypertension in her mother and sister; Skin cancer in her mother; Stroke in her maternal grandfather.  Medications: Current Outpatient Medications  Medication Sig Dispense Refill  . atorvastatin (LIPITOR) 20 MG tablet TAKE ONE TABLET BY MOUTH ONCE DAILY 90 tablet 3  . CALCIUM PO Take by mouth.    . losartan (COZAAR) 25 MG tablet Take 1 tablet (25 mg total) by mouth daily. 90 tablet 1   No current facility-administered medications for this visit.    No Known Allergies    Discussed warning signs or symptoms. Please see discharge instructions. Patient expresses understanding.

## 2018-06-14 ENCOUNTER — Ambulatory Visit: Payer: BC Managed Care – PPO | Admitting: Physical Therapy

## 2018-06-14 ENCOUNTER — Encounter: Payer: Self-pay | Admitting: Physical Therapy

## 2018-06-14 DIAGNOSIS — M25651 Stiffness of right hip, not elsewhere classified: Secondary | ICD-10-CM

## 2018-06-14 DIAGNOSIS — M25552 Pain in left hip: Secondary | ICD-10-CM

## 2018-06-14 DIAGNOSIS — M25551 Pain in right hip: Secondary | ICD-10-CM | POA: Diagnosis not present

## 2018-06-14 DIAGNOSIS — M25652 Stiffness of left hip, not elsewhere classified: Secondary | ICD-10-CM

## 2018-06-14 NOTE — Therapy (Signed)
[place], [place], [address] Phone: [phone]   Fax:  [phone]  Physical Therapy Treatment  Patient Details  Name: Tanya Cortez MRN: [id number] Date of Birth: 1951/10/11 Referring Provider: Dr Georgina Snell   Encounter Date: 06/14/2018  PT End of Session - 06/14/18 0710    Visit Number  9    Number of Visits  12    Date for PT Re-Evaluation  07/06/18    PT Start Time  0711    PT Stop Time  0819    PT Time Calculation (min)  68 min    Activity Tolerance  Patient tolerated treatment well       Past Medical History:  Diagnosis Date  . Degenerative joint disease (DJD) of hip 06/13/2018  . Essential hypertension, benign 04/22/2014  . Hyperlipidemia 04/22/2014  . Osteopenia determined by x-ray 04/20/2018   T score -2.1 on Dexa scan 2017    Past Surgical History:  Procedure Laterality Date  . TONSILLECTOMY      There were no vitals filed for this visit.  Subjective Assessment - 06/14/18 0714    Subjective  Trish reports that some days are good and some not so good, more good then before.  Played golf Sunday and was uncomfortable that after noon then it was better.  The pain has moved to the back spine still sore spot in Rt hip bone.     Patient Stated Goals  be able to use her joints until she is 67 yo, get exercises that will help her.     Currently in Pain?  No/denies no pain however can feel a sligth difference in the Rt hip joint area         [facility] PT Assessment - 06/14/18 0001      AROM   Right Hip External Rotation   38    Right Hip Internal Rotation   62    Left Hip External Rotation   34    Left Hip Internal Rotation   58                   [facility] Adult PT Treatment/Exercise - 06/14/18 0001      Knee/Hip Exercises: Stretches   Other Knee/Hip Stretches  cat cow      Knee/Hip Exercises: Aerobic   Nustep  L5 x 5' legs       Modalities   Modalities  Electrical  Stimulation;Iontophoresis;Moist Heat      Moist Heat Therapy   Number Minutes Moist Heat  15 Minutes    Moist Heat Location  Lumbar Spine buttocks      Electrical Stimulation   Electrical Stimulation Location  lumbar    Electrical Stimulation Action  IFC to tolerance    Electrical Stimulation Goals  Pain;Tone      Iontophoresis   Type of Iontophoresis  Dexamethasone    Location  Rt gluts/posterior greater troc area    Dose  1.0cc    Time  29mmp patche      Manual Therapy   Manual Therapy  Joint mobilization;Soft tissue mobilization;Passive ROM    Joint Mobilization  CPA and bilat UPA mobs lumbar spine grade III, good with CPA , hypomobie with Rt UPA along with a lot of tighness in the Rt paraspinals    Soft tissue mobilization  STM to bila tlumbar paraspinals    Passive ROM  bilat hips ER/IR       Trigger Point Dry Needling -  06/14/18 0740    Consent Given?  Yes    Education Handout Provided  No    Muscles Treated Upper Body  Longissimus    Muscles Treated Lower Body  Gluteus maximus    Longissimus Response  Palpable increased muscle length;Twitch response elicited Rt lumbar with stim    Gluteus Maximus Response  Palpable increased muscle length;Twitch response elicited Rt - very tight today           PT Education - 06/14/18 0718    Education provided  Yes    Education Details  ionto    Person(s) Educated  Patient    Methods  Explanation;Handout    Comprehension  Verbalized understanding          PT Long Term Goals - 06/14/18 0718      PT LONG TERM GOAL #1   Title  I with HEP for stretching bilat hips ( 05/30/18)     Status  Achieved      PT LONG TERM GOAL #2   Title  improve bilat hip ER to allow her to sit with her legs crossed to donn shoes/socks ( 07/06/18)     Status  Partially Met      PT LONG TERM GOAL #3   Title  improve bilat hip adduction to within 5 degrees from WNL ( 07/06/18)     Status  On-going      PT LONG TERM GOAL #4   Title  report =/>  75% reduction of pain with getting in her car ( 05/30/18)     Status  Achieved      PT LONG TERM GOAL #5   Title  improve FOTO =/< 24% limited ( 06/1118)     Status  On-going      PT LONG TERM GOAL #6   Title  walk with upright posture without verbal cues ( 05/30/18)     Status  Achieved            Plan - 06/14/18 0730    Clinical Impression Statement  Trish notes functional improvements with her golf swing, able to move through it smoother and hitting the ball further.  She is having more good days with less pain and easier movement than she was previously.  The pain is still some inthe groin with knees to chest however has moved more into the low back over the last week. She continues to improved.    Rehab Potential  Excellent    PT Frequency  1x / week    PT Duration  6 weeks    PT Treatment/Interventions  Iontophoresis 40m/ml Dexamethasone;Gait training;Dry needling;Manual techniques;Moist Heat;Traction;Patient/family education;Therapeutic exercise;Cryotherapy;Electrical Stimulation;Ultrasound;Passive range of motion    Consulted and Agree with Plan of Care  Patient       Patient will benefit from skilled therapeutic intervention in order to improve the following deficits and impairments:  Abnormal gait, Pain, Postural dysfunction, Increased muscle spasms, Decreased range of motion  Visit Diagnosis: Stiffness of right hip, not elsewhere classified  Stiffness of left hip, not elsewhere classified  Pain in right hip  Pain in left hip     Problem List Patient Active Problem List   Diagnosis Date Noted  . Degenerative joint disease (DJD) of hip 06/13/2018  . Osteopenia determined by x-ray 04/20/2018  . Tendonitis involving hip abductors 04/20/2018  . Heart murmur 05/24/2014  . Hyperlipidemia 04/22/2014  . Essential hypertension, benign 04/22/2014    SJeral PinchPT  06/14/2018, 8:07 AM  [facility] [place]  [place], [place], [address] Phone: [phone]   Fax:  [phone]  Name: Tanya Cortez MRN: [id number] Date of Birth: 03-13-1951

## 2018-06-14 NOTE — Patient Instructions (Signed)

## 2018-06-15 LAB — LIPID PANEL W/REFLEX DIRECT LDL
CHOL/HDL RATIO: 2.8 (calc) (ref <5.0)
CHOLESTEROL: 208 mg/dL — AB (ref <200)
HDL: 74 mg/dL (ref >50)
LDL Cholesterol (Calc): 120 mg/dL (calc) — ABNORMAL HIGH
NON-HDL CHOLESTEROL (CALC): 134 mg/dL — AB (ref <130)
Triglycerides: 52 mg/dL (ref <150)

## 2018-06-15 LAB — COMPLETE METABOLIC PANEL WITH GFR
AG Ratio: 2.4 (calc) (ref 1.0–2.5)
ALBUMIN MSPROF: 4.4 g/dL (ref 3.6–5.1)
ALKALINE PHOSPHATASE (APISO): 97 U/L (ref 33–130)
ALT: 11 U/L (ref 6–29)
AST: 19 U/L (ref 10–35)
BUN: 12 mg/dL (ref 7–25)
CO2: 30 mmol/L (ref 20–32)
CREATININE: 0.74 mg/dL (ref 0.50–0.99)
Calcium: 9.8 mg/dL (ref 8.6–10.4)
Chloride: 105 mmol/L (ref 98–110)
GFR, EST AFRICAN AMERICAN: 96 mL/min/{1.73_m2} (ref >=60)
GFR, EST NON AFRICAN AMERICAN: 83 mL/min/{1.73_m2} (ref >=60)
Globulin: 1.8 g/dL (calc) — ABNORMAL LOW (ref 1.9–3.7)
Glucose, Bld: 98 mg/dL (ref 65–99)
Potassium: 4.1 mmol/L (ref 3.5–5.3)
SODIUM: 143 mmol/L (ref 135–146)
TOTAL PROTEIN: 6.2 g/dL (ref 6.1–8.1)
Total Bilirubin: 0.5 mg/dL (ref 0.2–1.2)

## 2018-06-15 LAB — VITAMIN D 25 HYDROXY (VIT D DEFICIENCY, FRACTURES): VIT D 25 HYDROXY: 44 ng/mL (ref 30–100)

## 2018-06-19 ENCOUNTER — Other Ambulatory Visit: Payer: Self-pay

## 2018-06-19 MED ORDER — ATORVASTATIN CALCIUM 40 MG PO TABS: 40.0000 mg | ORAL_TABLET | Freq: Every day | ORAL | 1 refills | Status: DC

## 2018-06-19 NOTE — Telephone Encounter (Signed)
Pt called- needs RX for her increased [facility].  Per last labs it is stated by Dr Linford ArnoldMetheney "I agree, will increase atorvastatin to 40 mg"+   RX sent and pt advised

## 2018-06-21 ENCOUNTER — Encounter: Payer: Self-pay | Admitting: Physical Therapy

## 2018-06-21 ENCOUNTER — Ambulatory Visit: Payer: BC Managed Care – PPO | Admitting: Physical Therapy

## 2018-06-21 DIAGNOSIS — M25651 Stiffness of right hip, not elsewhere classified: Secondary | ICD-10-CM | POA: Diagnosis not present

## 2018-06-21 DIAGNOSIS — M25652 Stiffness of left hip, not elsewhere classified: Secondary | ICD-10-CM

## 2018-06-21 DIAGNOSIS — M25551 Pain in right hip: Secondary | ICD-10-CM | POA: Diagnosis not present

## 2018-06-21 DIAGNOSIS — M25552 Pain in left hip: Secondary | ICD-10-CM

## 2018-06-21 NOTE — Therapy (Signed)
[place], [place], [address] Phone: [phone]   Fax:  [phone]  Physical Therapy Treatment  Patient Details  Name: Tanya Cortez MRN: [id number] Date of Birth: 29-Oct-1951 Referring Provider: Dr Georgina Snell   Encounter Date: 06/21/2018  PT End of Session - 06/21/18 0718    Visit Number  10    Number of Visits  12    Date for PT Re-Evaluation  07/06/18    PT Start Time  0719    PT Stop Time  0819    PT Time Calculation (min)  60 min       Past Medical History:  Diagnosis Date  . Degenerative joint disease (DJD) of hip 06/13/2018  . Essential hypertension, benign 04/22/2014  . Hyperlipidemia 04/22/2014  . Osteopenia determined by x-ray 04/20/2018   T score -2.1 on Dexa scan 2017    Past Surgical History:  Procedure Laterality Date  . TONSILLECTOMY      There were no vitals filed for this visit.  Subjective Assessment - 06/21/18 0719    Subjective  Trish reports that her back is better from last week, the pain has moved again. Some pain still in Lt groin with lifting the leg however the pain has now moved intor the patellar tendon both sides.  Did well with patch, played golf Sat/Sun. saturday was tight, sunday was good    Patient Stated Goals  be able to use her joints until she is 67 yo, get exercises that will help her.     Currently in Pain?  Yes    Pain Score  2     Pain Location  Knee patellar tendon area    Pain Orientation  Left;Right    Pain Descriptors / Indicators  Dull    Pain Type  Chronic pain    Pain Onset  More than a month ago    Pain Frequency  Intermittent    Aggravating Factors   walking    Pain Relieving Factors  nothing right now, possibly moving around, sitting         [facility] PT Assessment - 06/21/18 0001      Assessment   Medical Diagnosis  Bilat hip bursitis    Referring Provider  Dr Georgina Snell    Onset Date/Surgical Date  04/19/15      Functional Tests   Functional tests  Other      Other:   Other/ Comments  reviewed HEP of SL sit to/from stand and high kneel to/from stand.  She has a lot of difficulty with kneel to stand requiring bilat UE assist still - will stop this for now and modify her HEP.  SL sit to stand good from Lt LE, had LE valgus on Rt - pt cued for form and was able to self correct      AROM   Right Hip External Rotation   40    Right Hip Internal Rotation   43    Right Hip ABduction  48    Left Hip External Rotation   30    Left Hip Internal Rotation   49    Left Hip ABduction  51      Flexibility   Quadriceps  prone knee flex Lt 135, Rt  135      Special Tests    Special Tests  -- (+) Thomas test Rt  [facility] Adult PT Treatment/Exercise - 06/21/18 0001      Knee/Hip Exercises: Aerobic   Nustep  L5 x 5' legs       Knee/Hip Exercises: Standing   Wall Squat  -- eccentric 5 sec lower, 5 second hold X 10    Other Standing Knee Exercises  FWD lunge with focus on eccentric, VC for form 10 reps      Modalities   Modalities  Iontophoresis;Moist Heat;Cryotherapy      Moist Heat Therapy   Number Minutes Moist Heat  15 Minutes    Moist Heat Location  Hip;Lumbar Spine anterior hip      Cryotherapy   Number Minutes Cryotherapy  15 Minutes    Cryotherapy Location  Knee    Type of Cryotherapy  Ice pack      Iontophoresis   Type of Iontophoresis  Dexamethasone    Location  L medial knee, R lat knee    Dose  1.0cc    Time  75mmp patche      Manual Therapy   Manual Therapy  Joint mobilization;Passive ROM;Manual Traction    Joint Mobilization  Inferior glide grade 2-3    Passive ROM  Flexion, IR, ER, Abd, pain with rotation Rt side    Manual Traction  bilat legs gentle with ossiclations             PT Education - 06/21/18 0913    Education provided  Yes    Education Details  HEP modification and instruction    Person(s) Educated  Patient    Methods   Explanation;Demonstration;Handout;Verbal cues    Comprehension  Verbalized understanding;Returned demonstration          PT Long Term Goals - 06/21/18 0748      PT LONG TERM GOAL #1   Title  I with HEP for stretching bilat hips ( 05/30/18)     Status  Achieved      PT LONG TERM GOAL #2   Title  improve bilat hip ER to allow her to sit with her legs crossed to donn shoes/socks ( 07/06/18)     Status  Partially Met      PT LONG TERM GOAL #3   Title  improve bilat hip adduction to within 5 degrees from WNL ( 07/06/18)     Status  Achieved      PT LONG TERM GOAL #4   Title  report =/> 75% reduction of pain with getting in her car ( 05/30/18)     Status  Achieved      PT LONG TERM GOAL #5   Title  improve FOTO =/< 24% limited ( 06/1118)     Status  On-going      PT LONG TERM GOAL #6   Title  walk with upright posture without verbal cues ( 05/30/18)     Status  Achieved            Plan - 06/21/18 0923    Clinical Impression Statement  Pt is making steady progress with ROM and goals.  she did meet one more goal this session.  She does have signs and symptoms now of quad tendonitis and her HEP was modifiec to include eccentric strengthening as well as she was treated with modalitied to decrease overall pain and inflammation.     Rehab Potential  Excellent    PT Frequency  1x / week    PT Duration  6 weeks    PT Treatment/Interventions  Iontophoresis 455mml  Dexamethasone;Gait training;Dry needling;Manual techniques;Moist Heat;Traction;Patient/family education;Therapeutic exercise;Cryotherapy;Electrical Stimulation;Ultrasound;Passive range of motion    PT Next Visit Plan  assess need for mor DN, cont hip mobs/manual therapy to increase ROM    Consulted and Agree with Plan of Care  Patient       Patient will benefit from skilled therapeutic intervention in order to improve the following deficits and impairments:  Abnormal gait, Pain, Postural dysfunction, Increased muscle spasms,  Decreased range of motion  Visit Diagnosis: Stiffness of right hip, not elsewhere classified  Stiffness of left hip, not elsewhere classified  Pain in right hip  Pain in left hip     Problem List Patient Active Problem List   Diagnosis Date Noted  . Degenerative joint disease (DJD) of hip 06/13/2018  . Osteopenia determined by x-ray 04/20/2018  . Tendonitis involving hip abductors 04/20/2018  . Heart murmur 05/24/2014  . Hyperlipidemia 04/22/2014  . Essential hypertension, benign 04/22/2014    Jeral Pinch PT  06/21/2018, 9:33 AM  Elsie Ra, PT, DPT [facility] [place], [place], [address] Phone: [phone]   Fax:  [phone]  Name: Anajah Sterbenz MRN: [id number] Date of Birth: October 21, 1951

## 2018-07-05 ENCOUNTER — Ambulatory Visit: Payer: BC Managed Care – PPO | Admitting: Rehabilitative and Restorative Service Providers"

## 2018-07-05 DIAGNOSIS — M25551 Pain in right hip: Secondary | ICD-10-CM | POA: Diagnosis not present

## 2018-07-05 DIAGNOSIS — M25652 Stiffness of left hip, not elsewhere classified: Secondary | ICD-10-CM | POA: Diagnosis not present

## 2018-07-05 DIAGNOSIS — M25651 Stiffness of right hip, not elsewhere classified: Secondary | ICD-10-CM

## 2018-07-05 DIAGNOSIS — M25552 Pain in left hip: Secondary | ICD-10-CM

## 2018-07-05 NOTE — Therapy (Addendum)
[facility] [address] [place], [place], [address] Phone: [phone]   Fax:  [phone]  Physical Therapy Treatment  Patient Details  Name: Tanya Cortez MRN: [id number] Date of Birth: 07-05-1951 Referring Provider: Dr Clementeen GrahamEvan Corey    Encounter Date: 07/05/2018  PT End of Session - 07/05/18 0711    Visit Number  11    Number of Visits  24    Date for PT Re-Evaluation  08/15/18    PT Start Time  0711    PT Stop Time  0810    PT Time Calculation (min)  59 min    Activity Tolerance  Patient tolerated treatment well       Past Medical History:  Diagnosis Date  . Degenerative joint disease (DJD) of hip 06/13/2018  . Essential hypertension, benign 04/22/2014  . Hyperlipidemia 04/22/2014  . Osteopenia determined by x-ray 04/20/2018   T score -2.1 on Dexa scan 2017    Past Surgical History:  Procedure Laterality Date  . TONSILLECTOMY      There were no vitals filed for this visit.  Subjective Assessment - 07/05/18 0716    Subjective  Patient reports that she has some pain in the groin with knees to chest. She has some pain in the Rt hip area; SI/LB area bilat today. PAin is better than it was but still there. She is doing her exercises at home and played goalf without difficulty.     Currently in Pain?  No/denies    Pain Frequency  Intermittent    Aggravating Factors   worse when she stands up to move from sitting position          [facility] PT Assessment - 07/05/18 0001      Assessment   Medical Diagnosis  Bilat hip bursitis    Referring Provider  Dr Clementeen GrahamEvan Corey     Onset Date/Surgical Date  04/19/15    Hand Dominance  Right    Next MD Visit  8 wks    Prior Therapy  never      Observation/Other Assessments   Focus on Therapeutic Outcomes (FOTO)   31% limitation       AROM   Lumbar Flexion  to floor    Lumbar Extension  70%    Lumbar - Right Side Bend  to top of knee - with tightness    Lumbar - Left Side  Bend  to mid knee    Lumbar - Right Rotation  70%    Lumbar - Left Rotation  70%      Strength   Right/Left Hip  -- 5/5 bilat       Palpation   Palpation comment  significant tightness bilat hip flexors; adductors; posteriorly along the crest of the pelvis                    [facility] Adult PT Treatment/Exercise - 07/05/18 0001      Therapeutic Activites    Therapeutic Activities  -- myofacial ball release hip flexors       Knee/Hip Exercises: Stretches   Hip Flexor Stretch  Right;Left;2 reps;30 seconds sitting       Knee/Hip Exercises: Aerobic   Nustep  L5 x 5' legs       Moist Heat Therapy   Number Minutes Moist Heat  20 Minutes    Moist Heat Location  Hip;Lumbar Spine anterior hip      Electrical Stimulation   Electrical Stimulation Location  bilat hip flexors     Electrical Stimulation Action  pre mod     Electrical Stimulation Parameters  to tolerance    Electrical Stimulation Goals  Pain;Tone      Iontophoresis   Type of Iontophoresis  Dexamethasone    Location  L medial knee, R lat knee    Dose  80 mamp     Time   8 hours       Manual Therapy   Manual therapy comments  pt supine     Soft tissue mobilization  deep tissue work through bilat hip flexors/quads        Trigger Point Dry Needling - 07/05/18 0803    Consent Given?  Yes    Muscles Treated Lower Body  -- hip flexors with estim bilat     Quadriceps Response  Palpable increased muscle length           PT Education - 07/05/18 0754    Education provided  Yes    Education Details  HEP     Person(s) Educated  Patient    Methods  Explanation;Demonstration;Tactile cues;Verbal cues;Handout    Comprehension  Verbalized understanding;Returned demonstration;Verbal cues required;Tactile cues required          PT Long Term Goals - 07/05/18 0711      PT LONG TERM GOAL #1   Title  I with HEP for stretching bilat hips ( 05/30/18)     Time  6    Period  Weeks    Status  Achieved      PT  LONG TERM GOAL #2   Title  improve bilat hip ER to allow her to sit with her legs crossed to donn shoes/socks ( 08/15/18)     Time  12    Period  Weeks    Status  Revised      PT LONG TERM GOAL #3   Title  improve bilat hip adduction to within 5 degrees from WNL ( 07/06/18)     Time  6    Period  Weeks    Status  Achieved      PT LONG TERM GOAL #4   Title  report =/> 75% reduction of pain with getting in her car ( 05/30/18)     Time  6    Period  Weeks    Status  Achieved      PT LONG TERM GOAL #5   Title  improve FOTO =/< 24% limited ( 08/15/18)     Time  6    Period  Weeks    Status  Revised      PT LONG TERM GOAL #6   Title  walk with upright posture without verbal cues ( 05/30/18)     Time  6    Period  Weeks    Status  Achieved            Plan - 07/05/18 0750    Clinical Impression Statement  Continued muscular tightness noted through the anterior hips at hip flexors as well as through the adductors. She responds well to DN and manula work/modalities. She will benefit from continued PT to address problems.     Rehab Potential  Excellent    PT Frequency  1x / week    PT Duration  6 weeks    PT Treatment/Interventions  Iontophoresis 4mg /ml Dexamethasone;Gait training;Dry needling;Manual techniques;Moist Heat;Traction;Patient/family education;Therapeutic exercise;Cryotherapy;Electrical Stimulation;Ultrasound;Passive range of motion    PT Next Visit Plan  cont DN, cont hip  mobs/manual therapy to increase ROM    Consulted and Agree with Plan of Care  Patient       Patient will benefit from skilled therapeutic intervention in order to improve the following deficits and impairments:  Abnormal gait, Pain, Postural dysfunction, Increased muscle spasms, Decreased range of motion  Visit Diagnosis: Stiffness of right hip, not elsewhere classified - Plan: PT plan of care cert/re-cert  Stiffness of left hip, not elsewhere classified - Plan: PT plan of care cert/re-cert  Pain in  right hip - Plan: PT plan of care cert/re-cert  Pain in left hip - Plan: PT plan of care cert/re-cert     Problem List Patient Active Problem List   Diagnosis Date Noted  . Degenerative joint disease (DJD) of hip 06/13/2018  . Osteopenia determined by x-ray 04/20/2018  . Tendonitis involving hip abductors 04/20/2018  . Heart murmur 05/24/2014  . Hyperlipidemia 04/22/2014  . Essential hypertension, benign 04/22/2014    Davidmichael Zarazua Rober Minion PT, MPH  07/05/2018, 8:39 AM  [facility] [address] [place], [place], [address] Phone: [phone]   Fax:  [phone]  Name: Tanya Cortez MRN: [id number] Date of Birth: 23-Oct-1951

## 2018-07-07 ENCOUNTER — Ambulatory Visit: Payer: BC Managed Care – PPO | Admitting: Rehabilitative and Restorative Service Providers"

## 2018-07-07 ENCOUNTER — Encounter: Payer: Self-pay | Admitting: Rehabilitative and Restorative Service Providers"

## 2018-07-07 DIAGNOSIS — M25551 Pain in right hip: Secondary | ICD-10-CM

## 2018-07-07 DIAGNOSIS — M25651 Stiffness of right hip, not elsewhere classified: Secondary | ICD-10-CM | POA: Diagnosis not present

## 2018-07-07 DIAGNOSIS — M25652 Stiffness of left hip, not elsewhere classified: Secondary | ICD-10-CM | POA: Diagnosis not present

## 2018-07-07 DIAGNOSIS — M25552 Pain in left hip: Secondary | ICD-10-CM

## 2018-07-07 NOTE — Patient Instructions (Signed)
Windshield wiper  Piriformis Stretch   Place one foot across opposite leg  Lying on back, pull right knee toward opposite shoulder. Hold __30__ seconds. Repeat __3__ times. Do __1-2__ sessions per day.   Outer Hip Stretch: Reclined IT Band Stretch (Strap)   Strap around one foot, pull leg across body until you feel a pull or stretch in the outside of your hip, with shoulders on mat. Hold for 30 seconds. Repeat 3 times each leg. 2-3 times/day.   Quads / HF, Supine   Lie near edge of bed, pull both knees up toward chest. Hold one knee as you drop the other leg off the edge of the bed.  Relax hanging knee/can bend knee back if indicated. Hold 30 seconds. Repeat 3 times per session. Do 2-3 sessions per day.  Quads / HF, Prone KNEE: Quadriceps - Prone    Place strap around ankle. Bring ankle toward buttocks. Press hip into surface. Hold 30 seconds. Repeat 3 times per session. Do 2-3 sessions per day.   Can do lunge, sit to stand and wall slide for strengthening

## 2018-07-07 NOTE — Therapy (Signed)
[facility] [place] [address] [place], [place], [address] Phone: [phone]   Fax:  [phone]  Physical Therapy Treatment  Patient Details  Name: Tanya Cortez MRN: [id number] Date of Birth: [birth year] Referring Provider: Dr Clementeen Graham    Encounter Date: 07/07/2018  PT End of Session - 07/07/18 1403    Visit Number  12    Number of Visits  24    Date for PT Re-Evaluation  08/15/18    PT Start Time  1400    PT Stop Time  1500    PT Time Calculation (min)  60 min    Activity Tolerance  Patient tolerated treatment well       Past Medical History:  Diagnosis Date  . Degenerative joint disease (DJD) of hip 06/13/2018  . Essential hypertension, benign 04/22/2014  . Hyperlipidemia 04/22/2014  . Osteopenia determined by x-ray 04/20/2018   T score -2.1 on Dexa scan 2017    Past Surgical History:  Procedure Laterality Date  . TONSILLECTOMY      There were no vitals filed for this visit.  Subjective Assessment - 07/07/18 1404    Subjective  Patient reports positive response to DN which "opened up the front" of her hips. She has increased pain in the Rt posterior hip to knee area and some continued pain in the LB. Can tell she is improving.     Currently in Pain?  Yes    Pain Score  3  with stretching no pain at rest     Pain Location  Hip    Pain Orientation  Right;Posterior    Pain Descriptors / Indicators  Sharp;Sore    Pain Type  Chronic pain    Pain Radiating Towards  to lateral inferior knee     Pain Onset  More than a month ago    Aggravating Factors   stretching; moving sit to stand    Pain Relieving Factors  DN; stretching                        [facility] Adult PT Treatment/Exercise - 07/07/18 0001      Knee/Hip Exercises: Stretches   Passive Hamstring Stretch  Right;2 reps;30 seconds    Hip Flexor Stretch  Right;Left;2 reps;30 seconds sitting and added supine thomas hip flexor stretch     ITB  Stretch  Right;2 reps;30 seconds    Piriformis Stretch  Right;Left;2 reps;30 seconds supine travell      Knee/Hip Exercises: Aerobic   Nustep  L5 x 5' legs/arms 10      Moist Heat Therapy   Number Minutes Moist Heat  20 Minutes    Moist Heat Location  Hip;Lumbar Spine anterior hip      Electrical Stimulation   Electrical Stimulation Location  Rt posterior hip     Electrical Stimulation Action  IFC     Electrical Stimulation Parameters  to tolerance     Electrical Stimulation Goals  Pain;Tone      Manual Therapy   Manual therapy comments  pt sidelying and prone     Joint Mobilization  hip mobs anterior glide w/ pt prone     Soft tissue mobilization  deep tissue work through Rt posterior and posterior lateral hip musculature        Trigger Point Dry Needling - 07/07/18 1441    Consent Given?  Yes    Muscles Treated Lower Body  -- Rt with estim  Gluteus Maximus Response  Palpable increased muscle length    Piriformis Response  Palpable increased muscle length    Tensor Fascia Lata Response  Palpable increased muscle length           PT Education - 07/07/18 1423    Education provided  Yes    Education Details  HEP     Person(s) Educated  Patient    Methods  Explanation;Demonstration;Tactile cues;Verbal cues;Handout    Comprehension  Verbalized understanding;Returned demonstration;Verbal cues required;Tactile cues required          PT Long Term Goals - 07/05/18 0711      PT LONG TERM GOAL #1   Title  I with HEP for stretching bilat hips ( 05/30/18)     Time  6    Period  Weeks    Status  Achieved      PT LONG TERM GOAL #2   Title  improve bilat hip ER to allow her to sit with her legs crossed to donn shoes/socks ( 08/15/18)     Time  12    Period  Weeks    Status  Revised      PT LONG TERM GOAL #3   Title  improve bilat hip adduction to within 5 degrees from WNL ( 07/06/18)     Time  6    Period  Weeks    Status  Achieved      PT LONG TERM GOAL #4    Title  report =/> 75% reduction of pain with getting in her car ( 05/30/18)     Time  6    Period  Weeks    Status  Achieved      PT LONG TERM GOAL #5   Title  improve FOTO =/< 24% limited ( 08/15/18)     Time  6    Period  Weeks    Status  Revised      PT LONG TERM GOAL #6   Title  walk with upright posture without verbal cues ( 05/30/18)     Time  6    Period  Weeks    Status  Achieved            Plan - 07/07/18 1423    Clinical Impression Statement  Patient responded well to DN through anterior hip. She has continued pain in the posterior lateral hip and inferior lateral knee. Responded well to DN and manual work. Modified HEP. Progressing well toward stated goals of therapy.     Rehab Potential  Excellent    PT Frequency  2x / week    PT Duration  6 weeks    PT Treatment/Interventions  Iontophoresis 4mg /ml Dexamethasone;Gait training;Dry needling;Manual techniques;Moist Heat;Traction;Patient/family education;Therapeutic exercise;Cryotherapy;Electrical Stimulation;Ultrasound;Passive range of motion    PT Next Visit Plan  cont DN, cont hip mobs/manual therapy to increase ROM    Consulted and Agree with Plan of Care  Patient       Patient will benefit from skilled therapeutic intervention in order to improve the following deficits and impairments:  Abnormal gait, Pain, Postural dysfunction, Increased muscle spasms, Decreased range of motion  Visit Diagnosis: Stiffness of right hip, not elsewhere classified  Stiffness of left hip, not elsewhere classified  Pain in right hip  Pain in left hip     Problem List Patient Active Problem List   Diagnosis Date Noted  . Degenerative joint disease (DJD) of hip 06/13/2018  . Osteopenia determined by x-ray 04/20/2018  . Tendonitis involving  hip abductors 04/20/2018  . Heart murmur 05/24/2014  . Hyperlipidemia 04/22/2014  . Essential hypertension, benign 04/22/2014    Danyela Posas Rober MinionP Rahmon Heigl PT, MPH  07/07/2018, 2:42 PM  [facility] [address] [place], [place], [address] Phone: [phone]   Fax:  [phone]  Name: Tanya Plateatricia Lehner MRN: [id number] Date of Birth: July 14, 1951

## 2018-07-12 ENCOUNTER — Encounter: Payer: Self-pay | Admitting: Rehabilitative and Restorative Service Providers"

## 2018-07-12 ENCOUNTER — Ambulatory Visit: Payer: BC Managed Care – PPO | Admitting: Rehabilitative and Restorative Service Providers"

## 2018-07-12 DIAGNOSIS — M25652 Stiffness of left hip, not elsewhere classified: Secondary | ICD-10-CM

## 2018-07-12 DIAGNOSIS — M25551 Pain in right hip: Secondary | ICD-10-CM

## 2018-07-12 DIAGNOSIS — M25651 Stiffness of right hip, not elsewhere classified: Secondary | ICD-10-CM | POA: Diagnosis not present

## 2018-07-12 DIAGNOSIS — M25552 Pain in left hip: Secondary | ICD-10-CM

## 2018-07-12 NOTE — Therapy (Signed)
[facility] [address] [place] [address] [place], [place], [address] Phone: [phone]   Fax:  [phone]  Physical Therapy Treatment  Patient Details  Name: Tanya Plateatricia Loconte MRN: [id number] Date of Birth: 1951-02-17 Referring Provider: Dr Clementeen GrahamEvan Corey    Encounter Date: 07/12/2018  PT End of Session - 07/12/18 0718    Visit Number  13    Number of Visits  24    Date for PT Re-Evaluation  08/15/18    PT Start Time  0716    PT Stop Time  0814    PT Time Calculation (min)  58 min       Past Medical History:  Diagnosis Date  . Degenerative joint disease (DJD) of hip 06/13/2018  . Essential hypertension, benign 04/22/2014  . Hyperlipidemia 04/22/2014  . Osteopenia determined by x-ray 04/20/2018   T score -2.1 on Dexa scan 2017    Past Surgical History:  Procedure Laterality Date  . TONSILLECTOMY      There were no vitals filed for this visit.  Subjective Assessment - 07/12/18 0718    Subjective  Patient reports some continued tightness and discomfort in the hips Lt > Rt. She played golf Saturday and noticed some soreness and cramping both hips/sacrum laterally afterwards. Still feels she is going in the right direction.    Currently in Pain?  No/denies discomfort     Pain Location  Hip    Pain Orientation  Left;Right;Posterior    Pain Descriptors / Indicators  Discomfort    Pain Type  Chronic pain    Pain Onset  More than a month ago    Pain Frequency  Intermittent         [facility] PT Assessment - 07/12/18 0001      Assessment   Medical Diagnosis  Bilat hip bursitis    Referring Provider  Dr Clementeen GrahamEvan Corey     Onset Date/Surgical Date  04/19/15    Hand Dominance  Right    Next MD Visit  8 wks    Prior Therapy  never      Palpation   Palpation comment  palpable tightness through lateral sacral border into piriformis/glutes to posterior trochantor bilaterally Rt greater than Lt                                 PT Long Term Goals - 07/12/18 1018      PT LONG TERM GOAL #1   Title  I with HEP for stretching bilat hips ( 05/30/18)     Time  6    Period  Weeks    Status  Achieved      PT LONG TERM GOAL #2   Title  improve bilat hip ER to allow her to sit with her legs crossed to donn shoes/socks ( 08/15/18)     Time  12    Period  Weeks    Status  On-going      PT LONG TERM GOAL #3   Title  improve bilat hip adduction to within 5 degrees from WNL ( 07/06/18)     Time  6    Period  Weeks    Status  Achieved      PT LONG TERM GOAL #4   Title  report =/> 75% reduction of pain with getting in her car ( 05/30/18)     Time  6    Period  Weeks  Status  Achieved      PT LONG TERM GOAL #5   Title  improve FOTO =/< 24% limited ( 08/15/18)     Time  6    Period  Weeks    Status  On-going      PT LONG TERM GOAL #6   Title  walk with upright posture without verbal cues ( 05/30/18)     Time  6    Period  Weeks    Status  Achieved            Plan - 07/12/18 1914    Clinical Impression Statement  Patient continues to report improvement. She has continued muscular tightness through the lateral posterior hips into the posterior greater trochanter. She has tightness in the piriformis and glut musculature and limited hip rotation IR > ER bilat. Symptoms continue to improve with current treatment. Progressing well toward goals of therapy.     Rehab Potential  Excellent    PT Frequency  2x / week    PT Duration  6 weeks    PT Treatment/Interventions  Iontophoresis 4mg /ml Dexamethasone;Gait training;Dry needling;Manual techniques;Moist Heat;Traction;Patient/family education;Therapeutic exercise;Cryotherapy;Electrical Stimulation;Ultrasound;Passive range of motion    PT Next Visit Plan  cont DN, cont hip mobs/manual therapy to increase ROM    Consulted and Agree with Plan of Care  Patient       Patient will benefit from skilled therapeutic  intervention in order to improve the following deficits and impairments:  Abnormal gait, Pain, Postural dysfunction, Increased muscle spasms, Decreased range of motion  Visit Diagnosis: Stiffness of right hip, not elsewhere classified  Stiffness of left hip, not elsewhere classified  Pain in right hip  Pain in left hip     Problem List Patient Active Problem List   Diagnosis Date Noted  . Degenerative joint disease (DJD) of hip 06/13/2018  . Osteopenia determined by x-ray 04/20/2018  . Tendonitis involving hip abductors 04/20/2018  . Heart murmur 05/24/2014  . Hyperlipidemia 04/22/2014  . Essential hypertension, benign 04/22/2014    Tanya Cortez Tanya Cortez PT, MPH  07/12/2018, 10:20 AM  [facility] [address] [place] [address] [place], [place], [address] Phone: [phone]   Fax:  [phone]  Name: Tanya Cortez MRN: [id number] Date of Birth: Jan 12, 1951

## 2018-07-14 ENCOUNTER — Ambulatory Visit: Payer: BC Managed Care – PPO | Admitting: Rehabilitative and Restorative Service Providers"

## 2018-07-14 ENCOUNTER — Encounter: Payer: Self-pay | Admitting: Rehabilitative and Restorative Service Providers"

## 2018-07-14 DIAGNOSIS — M25552 Pain in left hip: Secondary | ICD-10-CM | POA: Diagnosis not present

## 2018-07-14 DIAGNOSIS — M25651 Stiffness of right hip, not elsewhere classified: Secondary | ICD-10-CM | POA: Diagnosis not present

## 2018-07-14 DIAGNOSIS — M25652 Stiffness of left hip, not elsewhere classified: Secondary | ICD-10-CM | POA: Diagnosis not present

## 2018-07-14 DIAGNOSIS — M25551 Pain in right hip: Secondary | ICD-10-CM | POA: Diagnosis not present

## 2018-07-14 NOTE — Therapy (Signed)
[facility] [address] [place] [address] [place], [place], [address] Phone: [phone]   Fax:  [phone]  Physical Therapy Treatment  Patient Details  Name: Tanya Plateatricia Hipple MRN: [id number] Date of Birth: 1951-09-19 Referring Provider: Dr Clementeen GrahamEvan Corey    Encounter Date: 07/14/2018  PT End of Session - 07/14/18 0837    Visit Number  14    Number of Visits  24    Date for PT Re-Evaluation  08/15/18    PT Start Time  0841    PT Stop Time  0938    PT Time Calculation (min)  57 min    Activity Tolerance  Patient tolerated treatment well       Past Medical History:  Diagnosis Date  . Degenerative joint disease (DJD) of hip 06/13/2018  . Essential hypertension, benign 04/22/2014  . Hyperlipidemia 04/22/2014  . Osteopenia determined by x-ray 04/20/2018   T score -2.1 on Dexa scan 2017    Past Surgical History:  Procedure Laterality Date  . TONSILLECTOMY      There were no vitals filed for this visit.  Subjective Assessment - 07/14/18 0838    Subjective  Patient reports that she is making progress. She has discomfort when she stands from sitting after sitting as little as 15 min. She is having some discomfort in the Rt lateral knee and thigh to hip with butterfly stretch. Ball massage works for the posterior hip. Making progress.     Currently in Pain?  No/denies                       [facility] Treatment/Exercise - 07/14/18 0001      Knee/Hip Exercises: Stretches   Passive Hamstring Stretch  Right;2 reps;30 seconds    ITB Stretch  Right;2 reps;30 seconds    ITB Stretch Limitations  added ITB stretch in sidelying LE off edge of table 30 sec x 3 PT assist for 2 stretches     Piriformis Stretch  Right;Left;2 reps;30 seconds supine travell      Knee/Hip Exercises: Aerobic   Nustep  L5 x 5' legs/arms 10      Moist Heat Therapy   Number Minutes Moist Heat  15 Minutes    Moist Heat Location  Hip;Lumbar Spine  anterior hip      Electrical Stimulation   Electrical Stimulation Location  Rt lateral hip     Electrical Stimulation Action  IFC    Electrical Stimulation Parameters  to tolerance    Electrical Stimulation Goals  Pain;Tone      Manual Therapy   Manual therapy comments  pt sidelying and prone     Joint Mobilization  hip mobs anterior glide w/ pt prone     Soft tissue mobilization  deep tissue work through Rt posterior and posterior lateral hip musculature        Trigger Point Dry Needling - 07/14/18 0910    Consent Given?  Yes    Muscles Treated Lower Body  -- bilat with estim lateral fibularius bilat     Tensor Fascia Lata Response  Palpable increased muscle length;Twitch response elicited                PT Long Term Goals - 07/12/18 1018      PT LONG TERM GOAL #1   Title  I with HEP for stretching bilat hips ( 05/30/18)     Time  6    Period  Weeks  Status  Achieved      PT LONG TERM GOAL #2   Title  improve bilat hip ER to allow her to sit with her legs crossed to donn shoes/socks ( 08/15/18)     Time  12    Period  Weeks    Status  On-going      PT LONG TERM GOAL #3   Title  improve bilat hip adduction to within 5 degrees from WNL ( 07/06/18)     Time  6    Period  Weeks    Status  Achieved      PT LONG TERM GOAL #4   Title  report =/> 75% reduction of pain with getting in her car ( 05/30/18)     Time  6    Period  Weeks    Status  Achieved      PT LONG TERM GOAL #5   Title  improve FOTO =/< 24% limited ( 08/15/18)     Time  6    Period  Weeks    Status  On-going      PT LONG TERM GOAL #6   Title  walk with upright posture without verbal cues ( 05/30/18)     Time  6    Period  Weeks    Status  Achieved            Plan - 07/14/18 0847    Clinical Impression Statement  Continued progress with decreasing symptoms. [name] has intermittent discomfort in the knees and Rt > Lt hip. She has continued tightness to palpatioin through the posterior  lateral Rt hip/thigh. Responds well to DN and manual work as well as HEP. Progressing well toward stated goals of therapy.     Rehab Potential  Excellent    PT Frequency  2x / week    PT Treatment/Interventions  Iontophoresis 4mg /ml Dexamethasone;Gait training;Dry needling;Manual techniques;Moist Heat;Traction;Patient/family education;Therapeutic exercise;Cryotherapy;Electrical Stimulation;Ultrasound;Passive range of motion    PT Next Visit Plan  cont DN, cont hip mobs/manual therapy to increase ROM    Consulted and Agree with Plan of Care  Patient       Patient will benefit from skilled therapeutic intervention in order to improve the following deficits and impairments:  Abnormal gait, Pain, Postural dysfunction, Increased muscle spasms, Decreased range of motion  Visit Diagnosis: Stiffness of right hip, not elsewhere classified  Stiffness of left hip, not elsewhere classified  Pain in right hip  Pain in left hip     Problem List Patient Active Problem List   Diagnosis Date Noted  . Degenerative joint disease (DJD) of hip 06/13/2018  . Osteopenia determined by x-ray 04/20/2018  . Tendonitis involving hip abductors 04/20/2018  . Heart murmur 05/24/2014  . Hyperlipidemia 04/22/2014  . Essential hypertension, benign 04/22/2014    Macil Crady Rober Minion PT, MPH  07/14/2018, 9:29 AM  [facility] [address] [place] [address] [place], [place], [address] Phone: [phone]   Fax:  [phone]  Name: Tanya Cortez MRN: [id number] Date of Birth: 1951/01/25

## 2018-07-19 ENCOUNTER — Encounter: Payer: Self-pay | Admitting: Rehabilitative and Restorative Service Providers"

## 2018-07-19 ENCOUNTER — Ambulatory Visit: Payer: BC Managed Care – PPO | Admitting: Rehabilitative and Restorative Service Providers"

## 2018-07-19 DIAGNOSIS — M25551 Pain in right hip: Secondary | ICD-10-CM | POA: Diagnosis not present

## 2018-07-19 DIAGNOSIS — M25552 Pain in left hip: Secondary | ICD-10-CM

## 2018-07-19 DIAGNOSIS — M25652 Stiffness of left hip, not elsewhere classified: Secondary | ICD-10-CM | POA: Diagnosis not present

## 2018-07-19 DIAGNOSIS — M25651 Stiffness of right hip, not elsewhere classified: Secondary | ICD-10-CM | POA: Diagnosis not present

## 2018-07-19 NOTE — Patient Instructions (Addendum)
Strengthening: Hip Abductor - Resisted    With band looped around both legs above knees, push one knee out to side holding other knee still. Pause. Repeat 10____ times per set. Do __1-2__ sets per session. Do __1__ sessions per day.  Sleep with pillow between leg - all the way to ankles

## 2018-07-19 NOTE — Therapy (Signed)
[facility] [place] [address] [place], [place], [address] Phone: [phone]   Fax:  [phone]  Physical Therapy Treatment  Patient Details  Name: Tanya Cortez MRN: [id number] Date of Birth: July 10, 1951 Referring Provider: Dr Clementeen Graham    Encounter Date: 07/19/2018  PT End of Session - 07/19/18 0713    Visit Number  15    Number of Visits  24    Date for PT Re-Evaluation  08/15/18    PT Start Time  0713    PT Stop Time  0814    PT Time Calculation (min)  61 min    Activity Tolerance  Patient tolerated treatment well       Past Medical History:  Diagnosis Date  . Degenerative joint disease (DJD) of hip 06/13/2018  . Essential hypertension, benign 04/22/2014  . Hyperlipidemia 04/22/2014  . Osteopenia determined by x-ray 04/20/2018   T score -2.1 on Dexa scan 2017    Past Surgical History:  Procedure Laterality Date  . TONSILLECTOMY      There were no vitals filed for this visit.  Subjective Assessment - 07/19/18 0714    Subjective  Patient reports good improvement. She has stopped the stationary bike but continued with her exercises and golfing. She had a massage which also helped. Pleased that she can see progress. "Exciting"    Currently in Pain?  No/denies                       [facility] Adult PT Treatment/Exercise - 07/19/18 0001      Therapeutic Activites    Therapeutic Activities  -- myofacial ball release work posterior hip       Knee/Hip Exercises: Stretches   Passive Hamstring Stretch  Right;2 reps;30 seconds    ITB Stretch  Right;2 reps;30 seconds    Piriformis Stretch  Right;Left;2 reps;30 seconds supine travell    Other Knee/Hip Stretches  adductor stretch butterfly 20 sec x 2;    Other Knee/Hip Stretches  sartorius stretch Lt x 2  - 30 sec hold PT assist       Knee/Hip Exercises: Aerobic   Nustep  L5 x 5' legs/arms 10      Knee/Hip Exercises: Supine   Other Supine Knee/Hip  Exercises  clams holding one knee still moving opposite green TB x 10 each LE       Moist Heat Therapy   Number Minutes Moist Heat  20 Minutes    Moist Heat Location  Hip;Lumbar Spine anterior hip      Electrical Stimulation   Electrical Stimulation Location  bilat posterior hips and adductors     Electrical Stimulation Action  IFC    Electrical Stimulation Parameters  to tolerance    Electrical Stimulation Goals  Pain;Tone      Manual Therapy   Manual therapy comments  pt supine     Joint Mobilization  hips - anterior to posterior     Soft tissue mobilization  deep tissue work through the anterior hips/adductors bilat              PT Education - 07/19/18 0738    Education provided  Yes    Education Details  HEP     Person(s) Educated  Patient    Methods  Explanation;Demonstration;Tactile cues;Verbal cues;Handout    Comprehension  Verbalized understanding;Returned demonstration;Verbal cues required;Tactile cues required          PT Long Term Goals - 07/19/18 6962  PT LONG TERM GOAL #1   Title  I with HEP for stretching bilat hips ( 05/30/18)     Time  6    Period  Weeks    Status  Achieved      PT LONG TERM GOAL #2   Title  improve bilat hip ER to allow her to sit with her legs crossed to donn shoes/socks ( 08/15/18)     Time  12    Period  Weeks    Status  Achieved      PT LONG TERM GOAL #3   Title  improve bilat hip adduction to within 5 degrees from WNL ( 07/06/18)     Time  6    Period  Weeks    Status  Achieved      PT LONG TERM GOAL #4   Title  report =/> 75% reduction of pain with getting in her car ( 05/30/18)     Time  6    Period  Weeks    Status  Achieved      PT LONG TERM GOAL #5   Title  improve FOTO =/< 24% limited ( 08/15/18)     Time  6    Period  Weeks    Status  On-going      PT LONG TERM GOAL #6   Title  walk with upright posture without verbal cues ( 05/30/18)     Time  6    Period  Weeks    Status  Achieved             Plan - 07/19/18 0719    Clinical Impression Statement  Progressing well with rehab with decreased pain and tightness andnincreased functional abilities including golfing and walking. Progressing well toward remainder of rehab goals.     Rehab Potential  Excellent    PT Frequency  2x / week    PT Duration  6 weeks    PT Treatment/Interventions  Iontophoresis 4mg /ml Dexamethasone;Gait training;Dry needling;Manual techniques;Moist Heat;Traction;Patient/family education;Therapeutic exercise;Cryotherapy;Electrical Stimulation;Ultrasound;Passive range of motion    PT Next Visit Plan  cont DN, cont hip mobs/manual therapy to increase ROM as indicated        Patient will benefit from skilled therapeutic intervention in order to improve the following deficits and impairments:  Abnormal gait, Pain, Postural dysfunction, Increased muscle spasms, Decreased range of motion  Visit Diagnosis: Stiffness of right hip, not elsewhere classified  Stiffness of left hip, not elsewhere classified  Pain in right hip  Pain in left hip     Problem List Patient Active Problem List   Diagnosis Date Noted  . Degenerative joint disease (DJD) of hip 06/13/2018  . Osteopenia determined by x-ray 04/20/2018  . Tendonitis involving hip abductors 04/20/2018  . Heart murmur 05/24/2014  . Hyperlipidemia 04/22/2014  . Essential hypertension, benign 04/22/2014    Tanya Cortez PT, MPH  07/19/2018, 7:58 AM  [facility] [address] [place], [place], [address] Phone: [phone]   Fax:  [phone]  Name: Tanya Cortez MRN: [id number] Date of Birth: [birth year]

## 2018-07-21 ENCOUNTER — Encounter: Payer: Self-pay | Admitting: Rehabilitative and Restorative Service Providers"

## 2018-07-21 ENCOUNTER — Ambulatory Visit: Payer: BC Managed Care – PPO | Admitting: Rehabilitative and Restorative Service Providers"

## 2018-07-21 DIAGNOSIS — M25552 Pain in left hip: Secondary | ICD-10-CM | POA: Diagnosis not present

## 2018-07-21 DIAGNOSIS — M25652 Stiffness of left hip, not elsewhere classified: Secondary | ICD-10-CM | POA: Diagnosis not present

## 2018-07-21 DIAGNOSIS — M25651 Stiffness of right hip, not elsewhere classified: Secondary | ICD-10-CM

## 2018-07-21 DIAGNOSIS — M25551 Pain in right hip: Secondary | ICD-10-CM | POA: Diagnosis not present

## 2018-07-21 NOTE — Patient Instructions (Addendum)
Strengthening: Hip Abduction - Resisted    With tubing around right leg, other side toward anchor, extend leg out from side. Repeat __10__ times per set. Do _2-3___ sets per session. Do _1___ sessions per day.     Strengthening: Hip Extension - Resisted    With tubing around right ankle, face anchor and pull leg straight back. Repeat _10___ times per set. Do _2-3___ sets per session. Do __1__ sessions per day.     

## 2018-07-21 NOTE — Therapy (Signed)
[facility] [address] [place], [place], [address] Phone: [phone]   Fax:  [phone]  Physical Therapy Treatment  Patient Details  Name: Tanya Cortez MRN: [id number] Date of Birth: July 13, 1951 Referring Provider: Dr Clementeen GrahamEvan Corey    Encounter Date: 07/21/2018  PT End of Session - 07/21/18 0808    Visit Number  16    Number of Visits  24    Date for PT Re-Evaluation  08/15/18    PT Start Time  0800    PT Stop Time  0851    PT Time Calculation (min)  51 min    Activity Tolerance  Patient tolerated treatment well       Past Medical History:  Diagnosis Date  . Degenerative joint disease (DJD) of hip 06/13/2018  . Essential hypertension, benign 04/22/2014  . Hyperlipidemia 04/22/2014  . Osteopenia determined by x-ray 04/20/2018   T score -2.1 on Dexa scan 2017    Past Surgical History:  Procedure Laterality Date  . TONSILLECTOMY      There were no vitals filed for this visit.  Subjective Assessment - 07/21/18 0813    Subjective  Patient reports that she is has some aching in her knees when she initially stands from sitting. The hips are feeling better. She rode her bikefor about 15 min yesterday and the hips did OK.                        [facility] Adult PT Treatment/Exercise - 07/21/18 0001      Knee/Hip Exercises: Stretches   Quad Stretch  Right;Left;3 reps;30 seconds;60 seconds foam roll at distal thigh    Other Knee/Hip Stretches  adductor stretch butterfly 20 sec x 2;      Knee/Hip Exercises: Standing   Hip Abduction  AROM;Stengthening;Right;Left;10 reps    Hip Extension  AROM;Stengthening;Right;Left;10 reps      Moist Heat Therapy   Number Minutes Moist Heat  15 Minutes    Moist Heat Location  Hip;Lumbar Spine anterior hip      Electrical Stimulation   Electrical Stimulation Location  hip adductors/flexors bilat     Electrical Stimulation Action  TENS    Electrical Stimulation  Parameters  to tolerance    Electrical Stimulation Goals  Pain;Tone      Manual Therapy   Manual therapy comments  pt supine     Soft tissue mobilization  deep tissue work through the anterior hips/adductors bilat              PT Education - 07/21/18 0843    Education provided  Yes    Education Details  HEP     Person(s) Educated  Patient    Methods  Explanation;Demonstration;Tactile cues;Verbal cues;Handout    Comprehension  Verbalized understanding;Returned demonstration;Verbal cues required;Tactile cues required          PT Long Term Goals - 07/19/18 0713      PT LONG TERM GOAL #1   Title  I with HEP for stretching bilat hips ( 05/30/18)     Time  6    Period  Weeks    Status  Achieved      PT LONG TERM GOAL #2   Title  improve bilat hip ER to allow her to sit with her legs crossed to donn shoes/socks ( 08/15/18)     Time  12    Period  Weeks    Status  Achieved  PT LONG TERM GOAL #3   Title  improve bilat hip adduction to within 5 degrees from WNL ( 07/06/18)     Time  6    Period  Weeks    Status  Achieved      PT LONG TERM GOAL #4   Title  report =/> 75% reduction of pain with getting in her car ( 05/30/18)     Time  6    Period  Weeks    Status  Achieved      PT LONG TERM GOAL #5   Title  improve FOTO =/< 24% limited ( 08/15/18)     Time  6    Period  Weeks    Status  On-going      PT LONG TERM GOAL #6   Title  walk with upright posture without verbal cues ( 05/30/18)     Time  6    Period  Weeks    Status  Achieved            Plan - 07/21/18 0815    Clinical Impression Statement  Good progress with hips and LE's - patient reports decreased pain and tightness. She has improved functional activities; increased ROM/mobility; decreased palpable tightness. She is progressing well toward goals of therapy. Good response to manual work and DN.     Rehab Potential  Excellent    PT Frequency  2x / week    PT Duration  6 weeks    PT  Treatment/Interventions  Iontophoresis 4mg /ml Dexamethasone;Gait training;Dry needling;Manual techniques;Moist Heat;Traction;Patient/family education;Therapeutic exercise;Cryotherapy;Electrical Stimulation;Ultrasound;Passive range of motion    PT Next Visit Plan  cont DN, cont hip mobs/manual therapy to increase ROM as indicated     Consulted and Agree with Plan of Care  Patient       Patient will benefit from skilled therapeutic intervention in order to improve the following deficits and impairments:  Abnormal gait, Pain, Postural dysfunction, Increased muscle spasms, Decreased range of motion  Visit Diagnosis: Stiffness of right hip, not elsewhere classified  Stiffness of left hip, not elsewhere classified  Pain in right hip  Pain in left hip     Problem List Patient Active Problem List   Diagnosis Date Noted  . Degenerative joint disease (DJD) of hip 06/13/2018  . Osteopenia determined by x-ray 04/20/2018  . Tendonitis involving hip abductors 04/20/2018  . Heart murmur 05/24/2014  . Hyperlipidemia 04/22/2014  . Essential hypertension, benign 04/22/2014    Orville Mena Rober Minion PT, MPH  07/21/2018, 8:44 AM  [facility] [address] [place], [place], [address] Phone: [phone]   Fax:  [phone]  Name: Tanya Cortez MRN: [id number] Date of Birth: [birth year]

## 2018-07-26 ENCOUNTER — Encounter: Payer: BC Managed Care – PPO | Admitting: Rehabilitative and Restorative Service Providers"

## 2018-08-01 ENCOUNTER — Ambulatory Visit: Payer: BC Managed Care – PPO | Admitting: Family Medicine

## 2018-08-01 ENCOUNTER — Encounter: Payer: Self-pay | Admitting: Family Medicine

## 2018-08-01 VITALS — BP 117/55 | HR 74 | Ht 64.0 in | Wt 137.0 lb

## 2018-08-01 DIAGNOSIS — E7800 Pure hypercholesterolemia, unspecified: Secondary | ICD-10-CM

## 2018-08-01 DIAGNOSIS — I10 Essential (primary) hypertension: Secondary | ICD-10-CM

## 2018-08-01 DIAGNOSIS — M8580 Other specified disorders of bone density and structure, unspecified site: Secondary | ICD-10-CM | POA: Diagnosis not present

## 2018-08-01 DIAGNOSIS — M76899 Other specified enthesopathies of unspecified lower limb, excluding foot: Secondary | ICD-10-CM

## 2018-08-01 DIAGNOSIS — Z1231 Encounter for screening mammogram for malignant neoplasm of breast: Secondary | ICD-10-CM | POA: Diagnosis not present

## 2018-08-01 LAB — COMPLETE METABOLIC PANEL WITH GFR
AG Ratio: 2.6 (calc) — ABNORMAL HIGH (ref 1.0–2.5)
ALKALINE PHOSPHATASE (APISO): 94 U/L (ref 33–130)
ALT: 10 U/L (ref 6–29)
AST: 18 U/L (ref 10–35)
Albumin: 4.5 g/dL (ref 3.6–5.1)
BILIRUBIN TOTAL: 0.6 mg/dL (ref 0.2–1.2)
BUN: 15 mg/dL (ref 7–25)
CHLORIDE: 105 mmol/L (ref 98–110)
CO2: 27 mmol/L (ref 20–32)
Calcium: 9.4 mg/dL (ref 8.6–10.4)
Creat: 0.74 mg/dL (ref 0.50–0.99)
GFR, Est African American: 96 mL/min/{1.73_m2} (ref >=60)
GFR, Est Non African American: 83 mL/min/{1.73_m2} (ref >=60)
GLUCOSE: 103 mg/dL — AB (ref 65–99)
Globulin: 1.7 g/dL (calc) — ABNORMAL LOW (ref 1.9–3.7)
Potassium: 4.4 mmol/L (ref 3.5–5.3)
Sodium: 141 mmol/L (ref 135–146)
Total Protein: 6.2 g/dL (ref 6.1–8.1)

## 2018-08-01 LAB — LIPID PANEL
CHOLESTEROL: 168 mg/dL (ref <200)
HDL: 71 mg/dL (ref >50)
LDL CHOLESTEROL (CALC): 83 mg/dL
Non-HDL Cholesterol (Calc): 97 mg/dL (calc) (ref <130)
TRIGLYCERIDES: 48 mg/dL (ref <150)
Total CHOL/HDL Ratio: 2.4 (calc) (ref <5.0)

## 2018-08-01 MED ORDER — LOSARTAN POTASSIUM 25 MG PO TABS: 25.0000 mg | ORAL_TABLET | Freq: Every day | ORAL | 1 refills | Status: DC

## 2018-08-01 NOTE — Progress Notes (Signed)
Subjective:    CC: BP and cholesterol  HPI:  Hypertension- Pt denies chest pain, SOB, dizziness, or heart palpitations.  Taking meds as directed w/o problems.  Denies medication side effects.    Hyperlipidemia - we recently increased her statin about 2 months ago.  She has been tolerating well thus far.  No myalgias or increased side effects.   Lab Results  Component Value Date   CHOL 208 (H) 06/14/2018   CHOL 190 08/02/2017   CHOL 278 (H) 06/23/2016   Lab Results  Component Value Date   HDL 74 06/14/2018   HDL 79 08/02/2017   HDL 84 06/23/2016   Lab Results  Component Value Date   LDLCALC 120 (H) 06/14/2018   LDLCALC 182 (H) 06/23/2016   LDLCALC 174 (H) 06/12/2015   Lab Results  Component Value Date   TRIG 52 06/14/2018   TRIG 74 08/02/2017   TRIG 59 06/23/2016   Lab Results  Component Value Date   CHOLHDL 2.8 06/14/2018   CHOLHDL 2.4 08/02/2017   CHOLHDL 3.3 06/23/2016   No results found for: LDLDIRECT  Tendinitis of hip abductors-she has been going to physical therapy very consistently and has been making some great progress.  She still has a little bit more to go and has been doing her home stretches and exercises.  Past medical history, Surgical history, Family history not pertinant except as noted below, Social history, Allergies, and medications have been entered into the medical record, reviewed, and corrections made.   Review of Systems: No fevers, chills, night sweats, weight loss, chest pain, or shortness of breath.   Objective:    General: Well Developed, well nourished, and in no acute distress.  Neuro: Alert and oriented x3, extra-ocular muscles intact, sensation grossly intact.  HEENT: Normocephalic, atraumatic  Skin: Warm and dry, no rashes. Cardiac: Regular rate and rhythm, no murmurs rubs or gallops, no lower extremity edema.  Respiratory: Clear to auscultation bilaterally. Not using accessory muscles, speaking in full  sentences.   Impression and Recommendations:    HTN - Well controlled. Continue current regimen. Follow up in  6 months.    Hyperlipidemia -due to recheck lipids and liver enzymes now that she is on increased dose of atorvastatin which she is been tolerating well without any significant side effects.  Tendinitis of both hips-continue with physical therapy.  She is doing better overall.  Osteopenia-due for repeat DEXA.  We discussed options.  If her T score is stable then we will just continue with exercise, calcium and vitamin D.  If her T score has dropped then we will consider starting a bisphosphonate early.

## 2018-08-02 ENCOUNTER — Encounter: Payer: Self-pay | Admitting: Rehabilitative and Restorative Service Providers"

## 2018-08-02 ENCOUNTER — Ambulatory Visit: Payer: BC Managed Care – PPO | Admitting: Rehabilitative and Restorative Service Providers"

## 2018-08-02 DIAGNOSIS — M25651 Stiffness of right hip, not elsewhere classified: Secondary | ICD-10-CM

## 2018-08-02 DIAGNOSIS — M25551 Pain in right hip: Secondary | ICD-10-CM

## 2018-08-02 DIAGNOSIS — M25552 Pain in left hip: Secondary | ICD-10-CM

## 2018-08-02 DIAGNOSIS — M25652 Stiffness of left hip, not elsewhere classified: Secondary | ICD-10-CM | POA: Diagnosis not present

## 2018-08-02 NOTE — Therapy (Signed)
[facility] [address] [place], [place], [address] Phone: [phone]   Fax:  [phone]  Physical Therapy Treatment  Patient Details  Name: Tanya Cortez MRN: [id number] Date of Birth: 1951-09-10 Referring Provider: Dr Clementeen GrahamEvan Corey    Encounter Date: 08/02/2018  PT End of Session - 08/02/18 0713    Visit Number  17    Number of Visits  24    Date for PT Re-Evaluation  08/15/18    PT Start Time  0713    PT Stop Time  0810    PT Time Calculation (min)  57 min    Activity Tolerance  Patient tolerated treatment well       Past Medical History:  Diagnosis Date  . Degenerative joint disease (DJD) of hip 06/13/2018  . Essential hypertension, benign 04/22/2014  . Hyperlipidemia 04/22/2014  . Osteopenia determined by x-ray 04/20/2018   T score -2.1 on Dexa scan 2017    Past Surgical History:  Procedure Laterality Date  . TONSILLECTOMY      There were no vitals filed for this visit.  Subjective Assessment - 08/02/18 0714    Subjective  Some continued tightness in the anterior hips. Overall much better than she was. Cotninues to work on her exercises at home.     Currently in Pain?  No/denies         [facility] PT Assessment - 08/02/18 0001      Assessment   Medical Diagnosis  Bilat hip bursitis    Referring Provider  Dr Clementeen GrahamEvan Corey     Onset Date/Surgical Date  04/19/15    Hand Dominance  Right    Next MD Visit  PRN     Prior Therapy  never      AROM   Lumbar Flexion  90%    Lumbar Extension  70%    Lumbar - Right Side Bend  80%    Lumbar - Left Side Bend  80%    Lumbar - Right Rotation  70%    Lumbar - Left Rotation  70%      Palpation   Palpation comment  tightness through the psoas, anterior hip/hip flexors; adductors bilat - continued tightness posterior hips through the piriformis and gluts                    [facility] Adult PT Treatment/Exercise - 08/02/18 0001      Knee/Hip Exercises:  Stretches   Hip Flexor Stretch  Right;Left;2 reps;30 seconds supine with PT assist, sitting; half kneeling       Knee/Hip Exercises: Supine   Bridges  Strengthening;Right;Left;10 reps 5 sec hold       Moist Heat Therapy   Number Minutes Moist Heat  20 Minutes    Moist Heat Location  Hip;Lumbar Spine anterior hip      Electrical Stimulation   Electrical Stimulation Location  hip adductors/flexors bilat     Electrical Stimulation Action  IFC    Electrical Stimulation Parameters  to tolerance    Electrical Stimulation Goals  Pain;Tone      Manual Therapy   Manual therapy comments  pt supine     Soft tissue mobilization  deep tissue work through the anterior hips/adductors bilat focus on psoas/hip flexors                   PT Long Term Goals - 08/02/18 0801      PT LONG TERM GOAL #1   Title  I with HEP for stretching bilat hips ( 05/30/18)     Time  6    Period  Weeks    Status  Achieved      PT LONG TERM GOAL #2   Title  improve bilat hip ER to allow her to sit with her legs crossed to donn shoes/socks ( 08/15/18)     Time  12    Period  Weeks    Status  Achieved      PT LONG TERM GOAL #3   Title  improve bilat hip adduction to within 5 degrees from WNL ( 07/06/18)     Time  6    Period  Weeks    Status  Achieved      PT LONG TERM GOAL #4   Title  report =/> 75% reduction of pain with getting in her car ( 05/30/18)     Time  6    Period  Weeks    Status  Achieved      PT LONG TERM GOAL #5   Title  improve FOTO =/< 24% limited ( 08/15/18)     Time  6    Period  Weeks    Status  On-going      PT LONG TERM GOAL #6   Title  walk with upright posture without verbal cues ( 05/30/18)     Time  6    Period  Weeks    Status  Achieved            Plan - 08/02/18 0759    Clinical Impression Statement  Improving with decreased posterior hip pain and tightness and decreased bursitis pain. Patient has persistent pain in the anterior hip/hip flexors. Gradual  progress continues - not unexpected due to long standing nature of symptoms - 4 years.     Rehab Potential  Excellent    PT Frequency  2x / week    PT Duration  6 weeks    PT Treatment/Interventions  Iontophoresis 4mg /ml Dexamethasone;Gait training;Dry needling;Manual techniques;Moist Heat;Traction;Patient/family education;Therapeutic exercise;Cryotherapy;Electrical Stimulation;Ultrasound;Passive range of motion    PT Next Visit Plan  cont DN, cont hip mobs/manual therapy to increase ROM as indicated - trial of DN anterior hips     Consulted and Agree with Plan of Care  Patient       Patient will benefit from skilled therapeutic intervention in order to improve the following deficits and impairments:  Abnormal gait, Pain, Postural dysfunction, Increased muscle spasms, Decreased range of motion  Visit Diagnosis: Stiffness of right hip, not elsewhere classified  Stiffness of left hip, not elsewhere classified  Pain in right hip  Pain in left hip     Problem List Patient Active Problem List   Diagnosis Date Noted  . Degenerative joint disease (DJD) of hip 06/13/2018  . Osteopenia determined by x-ray 04/20/2018  . Tendonitis involving hip abductors 04/20/2018  . Heart murmur 05/24/2014  . Hyperlipidemia 04/22/2014  . Essential hypertension, benign 04/22/2014    Tanya Cortez Rober Minion PT, MPH  08/02/2018, 8:02 AM  [facility] [address] [place], [place], [address] Phone: [phone]   Fax:  [phone]  Name: Tanya Cortez MRN: [id number] Date of Birth: 03/01/1951

## 2018-08-04 ENCOUNTER — Encounter: Payer: BC Managed Care – PPO | Admitting: Rehabilitative and Restorative Service Providers"

## 2018-08-11 ENCOUNTER — Encounter: Payer: Self-pay | Admitting: Rehabilitative and Restorative Service Providers"

## 2018-08-11 ENCOUNTER — Ambulatory Visit: Payer: BC Managed Care – PPO | Admitting: Rehabilitative and Restorative Service Providers"

## 2018-08-11 DIAGNOSIS — M25651 Stiffness of right hip, not elsewhere classified: Secondary | ICD-10-CM

## 2018-08-11 DIAGNOSIS — M25652 Stiffness of left hip, not elsewhere classified: Secondary | ICD-10-CM | POA: Diagnosis not present

## 2018-08-11 DIAGNOSIS — M25551 Pain in right hip: Secondary | ICD-10-CM | POA: Diagnosis not present

## 2018-08-11 DIAGNOSIS — M25552 Pain in left hip: Secondary | ICD-10-CM

## 2018-08-11 NOTE — Therapy (Addendum)
[place], [place], [address] Phone: [phone]   Fax:  [phone]  Physical Therapy Treatment  Patient Details  Name: Tanya Cortez MRN: [id number] Date of Birth: 01/16/1951 Referring Provider: Dr Lynne Leader    Encounter Date: 08/11/2018  PT End of Session - 08/11/18 1543    Visit Number  18    Number of Visits  24    Date for PT Re-Evaluation  08/15/18    PT Start Time  1526    PT Stop Time  1630    PT Time Calculation (min)  64 min    Activity Tolerance  Patient tolerated treatment well       Past Medical History:  Diagnosis Date  . Degenerative joint disease (DJD) of hip 06/13/2018  . Essential hypertension, benign 04/22/2014  . Hyperlipidemia 04/22/2014  . Osteopenia determined by x-ray 04/20/2018   T score -2.1 on Dexa scan 2017    Past Surgical History:  Procedure Laterality Date  . TONSILLECTOMY      There were no vitals filed for this visit.      [facility] PT Assessment - 08/11/18 0001      Assessment   Medical Diagnosis  Bilat hip bursitis    Referring Provider  Dr Lynne Leader     Onset Date/Surgical Date  04/19/15    Hand Dominance  Right    Next MD Visit  PRN       Observation/Other Assessments   Focus on Therapeutic Outcomes (FOTO)   21% limitation       AROM   Lumbar Flexion  90%    Lumbar Extension  70%    Lumbar - Right Side Bend  80%    Lumbar - Left Side Bend  80%    Lumbar - Right Rotation  70%    Lumbar - Left Rotation  70%      Strength   Strength Assessment Site  --   WFL's bilat LE's      Palpation   Palpation comment  minimal tightness through the psoas, anterior hip/hip flexors; adductors bilat - continued tightness posterior hips through the piriformis and gluts                    [facility] Adult PT Treatment/Exercise - 08/11/18 0001      Knee/Hip Exercises: Stretches   Hip Flexor Stretch  Right;Left;2 reps;30 seconds   supine with  PT assist, sitting; half kneeling    Piriformis Stretch  Right;Left;2 reps;30 seconds    Other Knee/Hip Stretches  adductor stretch butterfly 20 sec x 2;      Moist Heat Therapy   Number Minutes Moist Heat  15 Minutes    Moist Heat Location  Hip;Lumbar Spine   anterior hip     Electrical Stimulation   Electrical Stimulation Location  hip adductors/flexors bilat     Electrical Stimulation Action  IFC    Electrical Stimulation Parameters  to tolerance    Electrical Stimulation Goals  Pain;Tone      Manual Therapy   Manual therapy comments  pt supine and prone     Soft tissue mobilization  deep tissue work through the anterior hips/adductors bilat focus on psoas/hip flexors as well as posterior hip musculature including piriformis and gluts                   PT Long Term Goals - 08/11/18 1619      PT LONG  TERM GOAL #1   Title  I with HEP for stretching bilat hips ( 05/30/18)     Time  6    Period  Weeks    Status  Achieved      PT LONG TERM GOAL #2   Time  12    Period  Weeks    Status  Achieved      PT LONG TERM GOAL #3   Title  improve bilat hip adduction to within 5 degrees from WNL ( 07/06/18)     Time  6    Period  Weeks    Status  Achieved      PT LONG TERM GOAL #4   Title  report =/> 75% reduction of pain with getting in her car ( 05/30/18)     Time  6    Period  Weeks    Status  Achieved      PT LONG TERM GOAL #5   Title  improve FOTO =/< 24% limited ( 08/15/18)     Time  6    Period  Weeks    Status  On-going      PT LONG TERM GOAL #6   Title  walk with upright posture without verbal cues ( 05/30/18)     Time  6    Period  Weeks    Status  Achieved            Plan - 08/11/18 1620    Clinical Impression Statement  Excellent progress with hip rehab - patient reports good resolution of pain with only intermittnet pain and tightness which is related to activity level. She has increased hip ROM and decreased palpable tightenss through  anterior/lateral/posterior hips. Patient has accomplished most of rehab goals. She will schedule one additional appointment to review HEP and ask questions for discharge planning.     Rehab Potential  Excellent    PT Frequency  2x / week    PT Duration  6 weeks    PT Treatment/Interventions  Iontophoresis 60m/ml Dexamethasone;Gait training;Dry needling;Manual techniques;Moist Heat;Traction;Patient/family education;Therapeutic exercise;Cryotherapy;Electrical Stimulation;Ultrasound;Passive range of motion    PT Next Visit Plan  One additional appointment with patient to continue with independent HEP     Consulted and Agree with Plan of Care  Patient       Patient will benefit from skilled therapeutic intervention in order to improve the following deficits and impairments:  Abnormal gait, Pain, Postural dysfunction, Increased muscle spasms, Decreased range of motion  Visit Diagnosis: Stiffness of right hip, not elsewhere classified  Stiffness of left hip, not elsewhere classified  Pain in right hip  Pain in left hip     Problem List Patient Active Problem List   Diagnosis Date Noted  . Degenerative joint disease (DJD) of hip 06/13/2018  . Osteopenia determined by x-ray 04/20/2018  . Tendonitis involving hip abductors 04/20/2018  . Heart murmur 05/24/2014  . Hyperlipidemia 04/22/2014  . Essential hypertension, benign 04/22/2014    Tanya Cortez PNilda SimmerPT, MPH  08/11/2018, 4:37 PM  [facility] [address]Phone: [phone]  Fax:  [phone] Name: Tanya WherleyMRN: [id number] of Birth: [birth year] PHYSICAL THERAPY DISCHARGE SUMMARY  Visits from Start of Care: 18  Current functional level related to goals / functional outcomes: See last progress note for discharge status   Remaining deficits: Unknown    Education / Equipment: HEP  Plan: Patient agrees to discharge.  Patient  goals were met. Patient is being discharged due to meeting the stated rehab goals.  ?????    Rafeef Lau P. Helene Kelp PT, MPH 09/07/18 4:24 PM

## 2018-08-16 ENCOUNTER — Encounter: Payer: BC Managed Care – PPO | Admitting: Rehabilitative and Restorative Service Providers"

## 2018-08-23 ENCOUNTER — Encounter: Payer: BC Managed Care – PPO | Admitting: Physical Therapy

## 2018-09-13 ENCOUNTER — Ambulatory Visit (INDEPENDENT_AMBULATORY_CARE_PROVIDER_SITE_OTHER): Payer: BC Managed Care – PPO

## 2018-09-13 DIAGNOSIS — M8589 Other specified disorders of bone density and structure, multiple sites: Secondary | ICD-10-CM | POA: Diagnosis not present

## 2018-09-13 DIAGNOSIS — Z1231 Encounter for screening mammogram for malignant neoplasm of breast: Secondary | ICD-10-CM | POA: Diagnosis not present

## 2018-09-13 DIAGNOSIS — M8580 Other specified disorders of bone density and structure, unspecified site: Secondary | ICD-10-CM

## 2018-09-16 MED ORDER — ALENDRONATE SODIUM 70 MG PO TABS: 70.0000 mg | ORAL_TABLET | ORAL | 3 refills | Status: DC

## 2018-10-25 ENCOUNTER — Encounter: Payer: Self-pay | Admitting: Family Medicine

## 2018-10-26 ENCOUNTER — Ambulatory Visit (INDEPENDENT_AMBULATORY_CARE_PROVIDER_SITE_OTHER): Payer: BC Managed Care – PPO | Admitting: Family Medicine

## 2018-10-26 ENCOUNTER — Ambulatory Visit (INDEPENDENT_AMBULATORY_CARE_PROVIDER_SITE_OTHER): Payer: BC Managed Care – PPO

## 2018-10-26 VITALS — BP 136/71 | HR 74 | Ht 64.0 in | Wt 134.0 lb

## 2018-10-26 DIAGNOSIS — M25552 Pain in left hip: Secondary | ICD-10-CM

## 2018-10-26 DIAGNOSIS — M1612 Unilateral primary osteoarthritis, left hip: Secondary | ICD-10-CM | POA: Diagnosis not present

## 2018-10-26 DIAGNOSIS — M8588 Other specified disorders of bone density and structure, other site: Secondary | ICD-10-CM | POA: Diagnosis not present

## 2018-10-26 NOTE — Patient Instructions (Signed)
Thank you for coming in today. Ok to do continued home exercises.   Ok to continue to watch you hip.  No need for hip replacement until the pain worsens or you have problems with weakness, limited motion or it limits your activity.   Research orthopedic surgeons.   Dr Turner Daniels at [facility] is good.    Total Hip Replacement Total hip replacement is a surgical procedure to remove damaged bone in your hip joint and replace it with an artificial hip joint (prosthetic hip joint). The purpose of this surgery is to reduce pain and to improve your hip function. During a total hip replacement, one or both parts of the hip joint are replaced, depending on the type of joint damage you have. The hip is a ball-and-socket type of joint, and it has two main parts. The ball part of the joint (femoral head) is the top of the thigh bone (femur). The socket part of the joint is a large indent in the side of your pelvis (acetabulum) where the femur and pelvis meet. Tell a health care provider about:  Any allergies you have.  All medicines you are taking, including vitamins, herbs, eye drops, creams, and over-the-counter medicines.  Any problems you or family members have had with anesthetic medicines.  Any blood disorders you have.  Any surgeries you have had.  Any medical conditions you have. What are the risks? Generally, total hip replacement is a safe procedure. However, problems can occur, including:  Infection.  Dislocation (the ball of the hip-joint prosthesis comes out of contact with the socket).  Loosening of the piece (stem) that connects the prosthetic femoral head to the femur.  Fracture of the bone while inserting the prosthesis.  Formation of blood clots, which can break loose and travel to and injure your lungs (pulmonary embolus).  What happens before the procedure?  Plan to have someone take you home after the procedure.  Do not eat or drink anything after midnight  on the night before the procedure or as directed by your health care provider.  Ask your health care provider about: ? Changing or stopping your regular medicines. This is especially important if you are taking diabetes medicines or blood thinners. ? Taking medicines such as aspirin and ibuprofen. These medicines can thin your blood. Do not take these medicines before your procedure if your health care provider asks you not to.  Ask your health care provider about how your surgical site will be marked or identified.  You may be given antibiotic medicines to help prevent infection. What happens during the procedure?  To reduce your risk of infection: ? Your health care team will wash or sanitize their hands. ? Your skin will be washed with soap.  An IV tube will be inserted into one of your veins. You will be given one or more of the following: ? A medicine that makes you drowsy (sedative). ? A medicine that makes you fall asleep (general anesthetic). ? A medicine injected into your spine that numbs your body below the waist (spinal anesthetic).  An incision will be made in your hip. Your surgeon will take out any damaged cartilage and bone.  Your surgeon will then: ? Insert a prosthetic socket into the acetabulum of your pelvis. This is usually secured with screws. ? Remove the femoral head and replace it with a prosthetic ball and stem secured into the top of your femur. ? Place the ball into the socket and check the range  of motion and stability of your new hip. ? Close the incision and apply a bandage over the surgical site. What happens after the procedure?  You will stay in a recovery area until the medicines have worn off.  Your vital signs, such as your pulse and blood pressure, will be monitored.  Once you are awake and stable, you will be taken to a hospital room.  You may be directed to take actions to help prevent blood clots. These may include: ? Walking soon after  surgery, with someone assisting you. Moving around after surgery helps to improve blood flow. ? Taking medicines to thin your blood (anticoagulants). ? Wearing compression stockings or using different types of devices.  You will receive physical therapy until you are doing well and your health care provider feels it is safe for you to go home. This information is not intended to replace advice given to you by your health care provider. Make sure you discuss any questions you have with your health care provider. Document Released: 03/21/2001 Document Revised: 08/16/2016 Document Reviewed: 02/13/2014 Elsevier Interactive Patient Education  [employer].

## 2018-10-26 NOTE — Progress Notes (Signed)
Tanya Cortez is a 67 y.o. female who presents to [facility] today for hip pain.  Tanya Cortez returns to clinic today to follow-up her hip pain.  She has bilateral hip pain left worse than right.  This is been ongoing for approximately 6 months.  In April she was seen and had x-rays of her pelvis showing moderate DJD in her hips bilaterally.  She followed back up in June and was thought to have some hip abductor tendinopathy.  She had physical therapy and notes she had significant improvement in her lateral hip pain.  She notes today however she has continued pain in the anterior to medial hip and sometimes into the posterior hip.  This is worse with standing from a seated position and with flexion forward.  She notes pain is felt a soreness typically.  She wonders if she should see a chiropractor as she thinks she is reached maximal improvement with physical therapy.  She denies any radiating pain weakness or numbness or injury.  Pain is mild to moderate.  She is able to complete most of her activities including playing golf which is very important to her.   ROS:  As above  Exam:  BP 136/71   Pulse 74   Ht 5\' 4"  (1.626 m)   Wt 134 lb (60.8 kg)   BMI 23.00 kg/m  General: Well Developed, well nourished, and in no acute distress.  Neuro/Psych: Alert and oriented x3, extra-ocular muscles intact, able to move all 4 extremities, sensation grossly intact. Skin: Warm and dry, no rashes noted.  Respiratory: Not using accessory muscles, speaking in full sentences, trachea midline.  Cardiovascular: Pulses palpable, no extremity edema. Abdomen: Does not appear distended. MSK:  Left hip normal-appearing Significant decreased internal rotation approximately 5 degrees and external rotation decreased approximately 30 degrees. Flexion intact.  Extension intact.   FADIR test is significantly positive.  Faber test mildly positive. Intact strength.  Right hip  normal-appearing Decreased range of motion proximately 10 degrees internal rotation and 30 degrees external rotation. Intact flexion and extension. Positive Faber test mildly and mildly positive FADIR test Intact strength.  Mild antalgic gait.    Lab and Radiology Results X-ray images personally independently reviewed left hip Significant degenerative changes present in the left hip.  Moderate degenerative changes present in the right hip.  No acute fractures. Await formal radiology review.   EXAM: PELVIS - 1-2 VIEW  COMPARISON:  None.  FINDINGS: There is moderate degenerative joint disease of both hips with some loss of joint space, sclerosis, and spurring. No acute fracture is seen. The pelvic rami are intact. The SI joints are corticated. There is degenerative change also noted at the L4-5 level.  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 No acute abnormality.   Electronically Signed   By: Dwyane Dee M.D.   On: 04/18/2018 16:29 I personally (independently) visualized and performed the interpretation of the images attached in this note.   Assessment and Plan: 67 y.o. female with  Bilateral hip pain left worse than right with significant rotational range of motion limitation.  This is in the setting of severe degenerative changes on x-ray today.  I believe the source of pain is osteoarthritis/DJD.  Fortunately her pain is mild and her symptoms do not limit her activity very much.  We had a discussion about when to proceed with a total hip replacement.  Right now she does not meet criteria and is happy to continue her  home exercise program including hip strengthening exercises and watchful waiting.  As her pain is mild currently I do not see the utility in an injection as it stuck in a change anything.  Discussed expected surgical outcome from total hip replacement and recommended orthopedic surgeons.  She will do some research and proceed with  watchful waiting.  Recheck as needed.  I spent 25 minutes with this patient, greater than 50% was face-to-face time counseling regarding ddx and plan.   Orders Placed This Encounter  Procedures  . DG HIP UNILAT WITH PELVIS 2-3 VIEWS LEFT    Standing Status:   Future    Number of Occurrences:   1    Standing Expiration Date:   12/27/2019    Order Specific Question:   Reason for Exam (SYMPTOM  OR DIAGNOSIS REQUIRED)    Answer:   eval left hip pain. Concern worse DJD    Order Specific Question:   Preferred imaging location?    Answer:   [facility]    Order Specific Question:   Radiology Contrast Protocol - do NOT remove file path    Answer:   [email or web address]   No orders of the defined types were placed in this encounter.   Historical information moved to improve visibility of documentation.  Past Medical History:  Diagnosis Date  . Degenerative joint disease (DJD) of hip 06/13/2018  . Essential hypertension, benign 04/22/2014  . Hyperlipidemia 04/22/2014  . Osteopenia determined by x-ray 04/20/2018   T score -2.1 on Dexa scan 2017   Past Surgical History:  Procedure Laterality Date  . TONSILLECTOMY     Social History   Tobacco Use  . Smoking status: Never Smoker  . Smokeless tobacco: Never Used  Substance Use Topics  . Alcohol use: No   family history includes Breast cancer in her mother; COPD in her mother; Heart attack in her father and maternal grandfather; Hypertension in her mother and sister; Skin cancer in her mother; Stroke in her maternal grandfather.  Medications: Current Outpatient Medications  Medication Sig Dispense Refill  . alendronate (FOSAMAX) 70 MG tablet Take 1 tablet (70 mg total) by mouth every 7 (seven) days. Take with a full glass of water on an empty stomach. 12 tablet 3  . atorvastatin (LIPITOR) 40 MG tablet Take 1 tablet (40 mg total) by mouth daily. 90 tablet 1  . CALCIUM PO Take by mouth.      . losartan (COZAAR) 25 MG tablet Take 1 tablet (25 mg total) by mouth daily. 90 tablet 1   No current facility-administered medications for this visit.    No Known Allergies    Discussed warning signs or symptoms. Please see discharge instructions. Patient expresses understanding.

## 2018-10-31 ENCOUNTER — Encounter: Payer: Self-pay | Admitting: Family Medicine

## 2018-10-31 DIAGNOSIS — M160 Bilateral primary osteoarthritis of hip: Secondary | ICD-10-CM

## 2018-10-31 DIAGNOSIS — M25552 Pain in left hip: Secondary | ICD-10-CM

## 2018-11-13 ENCOUNTER — Ambulatory Visit (INDEPENDENT_AMBULATORY_CARE_PROVIDER_SITE_OTHER): Payer: Self-pay

## 2018-11-13 ENCOUNTER — Ambulatory Visit (INDEPENDENT_AMBULATORY_CARE_PROVIDER_SITE_OTHER): Payer: Medicare Other | Admitting: Physician Assistant

## 2018-11-13 ENCOUNTER — Encounter (INDEPENDENT_AMBULATORY_CARE_PROVIDER_SITE_OTHER): Payer: Self-pay | Admitting: Physician Assistant

## 2018-11-13 DIAGNOSIS — M1712 Unilateral primary osteoarthritis, left knee: Secondary | ICD-10-CM

## 2018-11-13 DIAGNOSIS — M1611 Unilateral primary osteoarthritis, right hip: Secondary | ICD-10-CM

## 2018-11-13 DIAGNOSIS — M25552 Pain in left hip: Secondary | ICD-10-CM | POA: Diagnosis not present

## 2018-11-13 NOTE — Progress Notes (Signed)
Office Visit Note   Patient: Tanya Cortez           Date of Birth: September 19, 1951           MRN: [id number] Visit Date: 11/13/2018              Requested by: Rodolph Bong, MD [address] [place], [place] [address] PCP: Agapito Games, MD   Assessment & Plan: Visit Diagnoses:  1. Pain in left hip   2. Unilateral primary osteoarthritis, left knee   3. Unilateral primary osteoarthritis, right hip     Plan: Patient continues to have left hip pain getting worse over the last 2 years despite physical therapy massage time.  She states that the left hip pain is greatly affecting her life keeping her from doing things she would like to do such as walking for exercise but is also affecting her activities of daily living.  After reviewing the radiographs with her she would like to proceed with a left total hip arthroplasty in the near future.  Handout on left total hip arthroplasty was given.  Total hip arthroplasty model and components were reviewed with the patient.  Questions were encouraged and answered by Dr. Magnus Ivan myself.  Risk including but not limited to DVT/PE, nerve or vessel injury, wound healing problems, hardware failure, prolonged pain and worsening pain.  We will see her back 2 weeks postop.  Other conservative treatment was discussed with the patient including intra-articular injection she does not wish to proceed with any type of injection in the hip.  Follow-Up Instructions: Return for 2 weeks post op.   Orders:  Orders Placed This Encounter  Procedures  . XR Pelvis 1-2 Views   No orders of the defined types were placed in this encounter.     Procedures: No procedures performed   Clinical Data: No additional findings.   Subjective: Chief Complaint  Patient presents with  . Left Hip - Pain    HPI Tanya Cortez is a 66 year old female comes in today for left hip pain.  She is referred by her family medicine physician Dr. Denyse Amass for left hip  pain.  She is tried physical therapy massage dry needling IT band stretching helps some but she continues to have pain about her left hip and groin area.  Pains been ongoing for the last 2 years and is getting worse.  She is having trouble donning shoes and socks.  And she has trouble getting in and out of a car due to the hip pain.  She states that limits her walking for exercise.  She uses no assistive device to ambulate.  She would like to stay away from any medications. Review of Systems Denies any fevers chills shortness of breath chest pain or recent infection.  Objective: Vital Signs: There were no vitals taken for this visit.  Physical Exam  Constitutional: She is oriented to person, place, and time. She appears well-developed and well-nourished. No distress.  Pulmonary/Chest: Effort normal.  Neurological: She is alert and oriented to person, place, and time.  Skin: She is not diaphoretic.  Psychiatric: She has a normal mood and affect.    Ortho Exam Bilateral hip she has slightly limited external rotation of both hips.  Bilateral hips has pain discomfort and very limited internal rotation left greater than right.  Logroll her left hip causes pain with internal rotation.  Calves are supple nontender bilaterally.  Leg lengths appear equal. Specialty Comments:  No specialty  comments available.  Imaging: Xr Pelvis 1-2 Views  Result Date: 11/13/2018 AP pelvis standing: Bilateral hips well located.  No acute fracture.  Moderate least severe arthritic changes bilateral hips right slightly greater than left.    PMFS History: Patient Active Problem List   Diagnosis Date Noted  . Degenerative joint disease (DJD) of hip 06/13/2018  . Osteopenia determined by x-ray 04/20/2018  . Tendonitis involving hip abductors 04/20/2018  . Heart murmur 05/24/2014  . Hyperlipidemia 04/22/2014  . Essential hypertension, benign 04/22/2014   Past Medical History:  Diagnosis Date  . Degenerative  joint disease (DJD) of hip 06/13/2018  . Essential hypertension, benign 04/22/2014  . Hyperlipidemia 04/22/2014  . Osteopenia determined by x-ray 04/20/2018   T score -2.1 on Dexa scan 2017    Family History  Problem Relation Age of Onset  . Skin cancer Mother        squamous cell   . Hypertension Mother   . Breast cancer Mother   . COPD Mother   . Heart attack Maternal Grandfather   . Stroke Maternal Grandfather   . Heart attack Father        died 68 yo  . Hypertension Sister     Past Surgical History:  Procedure Laterality Date  . TONSILLECTOMY     Social History   Occupational History  . Occupation: high school principal    Comment: [employer]  Tobacco Use  . Smoking status: Never Smoker  . Smokeless tobacco: Never Used  Substance and Sexual Activity  . Alcohol use: No  . Drug use: No  . Sexual activity: Never

## 2018-12-04 ENCOUNTER — Telehealth: Payer: Self-pay

## 2018-12-04 ENCOUNTER — Other Ambulatory Visit (INDEPENDENT_AMBULATORY_CARE_PROVIDER_SITE_OTHER): Payer: Self-pay | Admitting: Physician Assistant

## 2018-12-04 NOTE — Telephone Encounter (Signed)
Received call frpm Leonia Readeriesha Durant, NP with [facility], stating she did a house call and preformed a QuantaFlo on pt. Result showed significant PAD bilaterally in lower extremities. Per Carlye Grippeiesha, these results will be faxed to our office some time in the next 2 weeks.   I called pt and she states that she is having hip surgery in the next couple weeks, I advised her to have [facility] of her results to her surgeon to make sure he is able to review.

## 2018-12-04 NOTE — Telephone Encounter (Signed)
Sounds good. We can schedule for her further workup for PVD a couple of months after her hip surgery

## 2018-12-05 NOTE — Telephone Encounter (Signed)
Pt advised, will call to follow up a couple months after hip surgery

## 2018-12-05 NOTE — Patient Instructions (Signed)
Tanya Cortez  12/05/2018   Your procedure is scheduled on: Friday 12/15/2018  [facility] Main  En[facility]              Report to admitting at  0530  AM    Call this number if you have problems the morning of surgery [phone]    Remember: Do not eat food or drink liquids :After Midnight.              BRUSH YOUR TEETH MORNING OF SURGERY AND RINSE YOUR MOUTH OUT, NO CHEWING GUM CANDY OR MINTS.     Take these medicines the morning of surgery with A SIP OF WATER: Atorvastatin (Lipitor)                                You may not have any metal on your body including hair pins and              piercings  Do not wear jewelry, make-up, lotions, powders or perfumes, deodorant             Do not wear nail polish.  Do not shave  48 hours prior to surgery.                Do not bring valuables to the hospital. [facility] IS NOT             RESPONSIBLE   FOR VALUABLES.  Contacts, dentures or bridgework may not be worn into surgery.  Leave suitcase in the car. After surgery it may be brought to your room.                  Please read over the following fact sheets you were given: _____________________________________________________________________             [facility] Preparing [facility] Before surgery, you can play an important role.  Because skin is not sterile, your skin needs to be as free of germs as possible.  You can reduce the number of germs on your skin by washing with CHG (chlorahexidine gluconate) soap before surgery.  CHG is an antiseptic cleaner which kills germs and bonds with the skin to continue killing germs even after washing. Please DO NOT use if you have an allergy to CHG or antibacterial soaps.  If your skin becomes reddened/irritated stop using the CHG and inform your nurse when you arrive at Short Stay. Do not shave (including legs and underarms) for at least 48 hours prior to the first CHG shower.  You may shave  your face/neck. Please follow these instructions carefully:  1.  Shower with CHG Soap the night before surgery and the  morning of Surgery.  2.  If you choose to wash your hair, wash your hair first as usual with your  normal  shampoo.  3.  After you shampoo, rinse your hair and body thoroughly to remove the  shampoo.                           4.  Use CHG as you would any other liquid soap.  You can apply chg directly  to the skin and wash  Gently with a scrungie or clean washcloth.  5.  Apply the CHG Soap to your body ONLY FROM THE NECK DOWN.   Do not use on face/ open                           Wound or open sores. Avoid contact with eyes, ears mouth and genitals (private parts).                       Wash face,  Genitals (private parts) with your normal soap.             6.  Wash thoroughly, paying special attention to the area where your surgery  will be performed.  7.  Thoroughly rinse your body with warm water from the neck down.  8.  DO NOT shower/wash with your normal soap after using and rinsing off  the CHG Soap.                9.  Pat yourself dry with a clean towel.            10.  Wear clean pajamas.            11.  Place clean sheets on your bed the night of your first shower and do not  sleep with pets. Day of Surgery : Do not apply any lotions/deodorants the morning of surgery.  Please wear clean clothes to the hospital/surgery center.  FAILURE TO FOLLOW THESE INSTRUCTIONS MAY RESULT IN THE CANCELLATION OF YOUR SURGERY PATIENT SIGNATURE_________________________________  NURSE SIGNATURE__________________________________  ________________________________________________________________________   Adam Phenix  An incentive spirometer is a tool that can help keep your lungs clear and active. This tool measures how well you are filling your lungs with each breath. Taking long deep breaths may help reverse or decrease the chance of developing  breathing (pulmonary) problems (especially infection) following:  A long period of time when you are unable to move or be active. BEFORE THE PROCEDURE   If the spirometer includes an indicator to show your best effort, your nurse or respiratory therapist will set it to a desired goal.  If possible, sit up straight or lean slightly forward. Try not to slouch.  Hold the incentive spirometer in an upright position. INSTRUCTIONS FOR USE  1. Sit on the edge of your bed if possible, or sit up as far as you can in bed or on a chair. 2. Hold the incentive spirometer in an upright position. 3. Breathe out normally. 4. Place the mouthpiece in your mouth and seal your lips tightly around it. 5. Breathe in slowly and as deeply as possible, raising the piston or the ball toward the top of the column. 6. Hold your breath for 3-5 seconds or for as long as possible. Allow the piston or ball to fall to the bottom of the column. 7. Remove the mouthpiece from your mouth and breathe out normally. 8. Rest for a few seconds and repeat Steps 1 through 7 at least 10 times every 1-2 hours when you are awake. Take your time and take a few normal breaths between deep breaths. 9. The spirometer may include an indicator to show your best effort. Use the indicator as a goal to work toward during each repetition. 10. After each set of 10 deep breaths, practice coughing to be sure your lungs are clear. If you have an incision (the cut made at the time of surgery),  support your incision when coughing by placing a pillow or rolled up towels firmly against it. Once you are able to get out of bed, walk around indoors and cough well. You may stop using the incentive spirometer when instructed by your caregiver.  RISKS AND COMPLICATIONS  Take your time so you do not get dizzy or light-headed.  If you are in pain, you may need to take or ask for pain medication before doing incentive spirometry. It is harder to take a deep  breath if you are having pain. AFTER USE  Rest and breathe slowly and easily.  It can be helpful to keep track of a log of your progress. Your caregiver can provide you with a simple table to help with this. If you are using the spirometer at home, follow these instructions: [place] IF:   You are having difficultly using the spirometer.  You have trouble using the spirometer as often as instructed.  Your pain medication is not giving enough relief while using the spirometer.  You develop fever of 100.5 F (38.1 C) or higher. SEEK IMMEDIATE MEDICAL CARE IF:   You cough up bloody sputum that had not been present before.  You develop fever of 102 F (38.9 C) or greater.  You develop worsening pain at or near the incision site. MAKE SURE YOU:   Understand these instructions.  Will watch your condition.  Will get help right away if you are not doing well or get worse. Document Released: 04/25/2007 Document Revised: 03/06/2012 Document Reviewed: 06/26/2007 [facility] Patient Information 2014 [place], [place].   ________________________________________________________________________

## 2018-12-06 NOTE — Telephone Encounter (Signed)
Tanya Cortez with Tanya Cortez called again yesterday to ask if Dr Tanya Cortez has received the results form the bilateral lower extremity test.

## 2018-12-06 NOTE — Telephone Encounter (Signed)
It is not in my basket and if they can refax it and bring my attention to it.  That would be great.

## 2018-12-08 ENCOUNTER — Other Ambulatory Visit: Payer: Self-pay

## 2018-12-08 ENCOUNTER — Encounter (HOSPITAL_COMMUNITY)
Admission: RE | Admit: 2018-12-08 | Discharge: 2018-12-08 | Disposition: A | Discharge status: Home / Self Care | Payer: Medicare Other | Source: Ambulatory Visit | Attending: Orthopaedic Surgery | Admitting: Orthopaedic Surgery

## 2018-12-08 ENCOUNTER — Encounter (HOSPITAL_COMMUNITY): Payer: Self-pay

## 2018-12-08 DIAGNOSIS — Z01812 Encounter for preprocedural laboratory examination: Secondary | ICD-10-CM | POA: Diagnosis present

## 2018-12-08 LAB — BASIC METABOLIC PANEL
Anion gap: 8 (ref 5–15)
BUN: 14 mg/dL (ref 8–23)
CALCIUM: 9.3 mg/dL (ref 8.9–10.3)
CO2: 29 mmol/L (ref 22–32)
Chloride: 104 mmol/L (ref 98–111)
Creatinine, Ser: 0.71 mg/dL (ref 0.44–1.00)
GFR calc non Af Amer: >60 mL/min (ref >60)
Glucose, Bld: 104 mg/dL — ABNORMAL HIGH (ref 70–99)
Potassium: 4.2 mmol/L (ref 3.5–5.1)
Sodium: 141 mmol/L (ref 135–145)

## 2018-12-08 LAB — CBC
HCT: 41.5 % (ref 36.0–46.0)
Hemoglobin: 13.1 g/dL (ref 12.0–15.0)
MCH: 30.5 pg (ref 26.0–34.0)
MCHC: 31.6 g/dL (ref 30.0–36.0)
MCV: 96.7 fL (ref 80.0–100.0)
Platelets: 236 10*3/uL (ref 150–400)
RBC: 4.29 MIL/uL (ref 3.87–5.11)
RDW: 12.2 % (ref 11.5–15.5)
WBC: 5.3 10*3/uL (ref 4.0–10.5)
nRBC: 0 % (ref 0.0–0.2)

## 2018-12-08 LAB — SURGICAL PCR SCREEN
MRSA, PCR: NEGATIVE
Staphylococcus aureus: NEGATIVE

## 2018-12-08 NOTE — Telephone Encounter (Signed)
Left message advising to fax the results again.

## 2018-12-09 ENCOUNTER — Encounter: Payer: Self-pay | Admitting: Family Medicine

## 2018-12-12 ENCOUNTER — Telehealth: Payer: Self-pay | Admitting: Family Medicine

## 2018-12-12 NOTE — Telephone Encounter (Signed)
Call pt: I did receive her copy of home test by health insurere look at blood flow in he feet.  This is a screening test and it is abnomal. Once she has recovered from her hip surgery. We need to schedule her for formal ABIs to look at blood flow.

## 2018-12-13 NOTE — Telephone Encounter (Signed)
Left VM for Pt to return clinic call.  

## 2018-12-13 NOTE — Telephone Encounter (Signed)
Patient advised of recommendations.  

## 2018-12-14 ENCOUNTER — Encounter (HOSPITAL_COMMUNITY): Payer: Self-pay | Admitting: Anesthesiology

## 2018-12-14 NOTE — Anesthesia Preprocedure Evaluation (Addendum)
Anesthesia Evaluation  Patient identified by MRN, date of birth, ID band Patient awake    Reviewed: Allergy & Precautions, NPO status , Patient's Chart, lab work & pertinent test results  Airway Mallampati: I       Dental no notable dental hx. (+) Teeth Intact   Pulmonary neg pulmonary ROS,    Pulmonary exam normal breath sounds clear to auscultation       Cardiovascular hypertension, Pt. on medications Normal cardiovascular exam Rhythm:Regular Rate:Normal     Neuro/Psych negative neurological ROS  negative psych ROS   GI/Hepatic negative GI ROS, Neg liver ROS,   Endo/Other  negative endocrine ROS  Renal/GU negative Renal ROS  negative genitourinary   Musculoskeletal  (+) Arthritis , Osteoarthritis,    Abdominal Normal abdominal exam  (+)   Peds  Hematology negative hematology ROS (+)   Anesthesia Other Findings   Reproductive/Obstetrics                            Anesthesia Physical Anesthesia Plan  ASA: II  Anesthesia Plan: Spinal   Post-op Pain Management:    Induction:   PONV Risk Score and Plan: 3 and Ondansetron and Dexamethasone  Airway Management Planned: Natural Airway, Nasal Cannula and Simple Face Mask  Additional Equipment:   Intra-op Plan:   Post-operative Plan:   Informed Consent: I have reviewed the patients History and Physical, chart, labs and discussed the procedure including the risks, benefits and alternatives for the proposed anesthesia with the patient or authorized representative who has indicated his/her understanding and acceptance.     Plan Discussed with: CRNA  Anesthesia Plan Comments:        Anesthesia Quick Evaluation

## 2018-12-15 ENCOUNTER — Inpatient Hospital Stay (HOSPITAL_COMMUNITY): Payer: Medicare Other

## 2018-12-15 ENCOUNTER — Inpatient Hospital Stay (HOSPITAL_COMMUNITY): Payer: Medicare Other | Admitting: Anesthesiology

## 2018-12-15 ENCOUNTER — Encounter (HOSPITAL_COMMUNITY): Payer: Self-pay | Admitting: *Deleted

## 2018-12-15 ENCOUNTER — Other Ambulatory Visit: Payer: Self-pay

## 2018-12-15 ENCOUNTER — Encounter (HOSPITAL_COMMUNITY)
Admission: RE | Disposition: A | Discharge status: Home Health | Payer: Self-pay | Source: Home / Self Care | Attending: Orthopaedic Surgery

## 2018-12-15 ENCOUNTER — Inpatient Hospital Stay (HOSPITAL_COMMUNITY)
Admission: RE | Admit: 2018-12-15 | Discharge: 2018-12-16 | DRG: 470 | Disposition: A | Discharge status: Home Health | Payer: Medicare Other | Attending: Orthopaedic Surgery | Admitting: Orthopaedic Surgery

## 2018-12-15 DIAGNOSIS — M25752 Osteophyte, left hip: Secondary | ICD-10-CM | POA: Diagnosis present

## 2018-12-15 DIAGNOSIS — M8580 Other specified disorders of bone density and structure, unspecified site: Secondary | ICD-10-CM | POA: Diagnosis present

## 2018-12-15 DIAGNOSIS — E785 Hyperlipidemia, unspecified: Secondary | ICD-10-CM | POA: Diagnosis present

## 2018-12-15 DIAGNOSIS — M1612 Unilateral primary osteoarthritis, left hip: Secondary | ICD-10-CM

## 2018-12-15 DIAGNOSIS — Z419 Encounter for procedure for purposes other than remedying health state, unspecified: Secondary | ICD-10-CM

## 2018-12-15 DIAGNOSIS — Z8249 Family history of ischemic heart disease and other diseases of the circulatory system: Secondary | ICD-10-CM | POA: Diagnosis not present

## 2018-12-15 DIAGNOSIS — Z825 Family history of asthma and other chronic lower respiratory diseases: Secondary | ICD-10-CM

## 2018-12-15 DIAGNOSIS — I10 Essential (primary) hypertension: Secondary | ICD-10-CM | POA: Diagnosis present

## 2018-12-15 DIAGNOSIS — Z96642 Presence of left artificial hip joint: Secondary | ICD-10-CM

## 2018-12-15 HISTORY — PX: TOTAL HIP ARTHROPLASTY: SHX124

## 2018-12-15 SURGERY — ARTHROPLASTY, HIP, TOTAL, ANTERIOR APPROACH
Anesthesia: Spinal | Site: Hip | Laterality: Left

## 2018-12-15 MED ORDER — ONDANSETRON HCL 4 MG/2ML IJ SOLN
INTRAMUSCULAR | Status: AC
  Filled 2018-12-15: qty 2

## 2018-12-15 MED ORDER — CALTRATE 600+D PLUS MINERALS 600-800 MG-UNIT PO TABS: 1.0000 | ORAL_TABLET | Freq: Two times a day (BID) | ORAL | Status: DC

## 2018-12-15 MED ORDER — POLYETHYLENE GLYCOL 3350 17 G PO PACK: 17.0000 g | PACK | Freq: Every day | ORAL | Status: DC | PRN

## 2018-12-15 MED ORDER — DEXAMETHASONE SODIUM PHOSPHATE 10 MG/ML IJ SOLN
INTRAMUSCULAR | Status: AC
  Filled 2018-12-15: qty 1

## 2018-12-15 MED ORDER — CALCIUM CARBONATE-VITAMIN D 500-200 MG-UNIT PO TABS
1.0000 | ORAL_TABLET | Freq: Two times a day (BID) | ORAL | Status: DC
  Administered 2018-12-15 – 2018-12-16 (×2): 1 via ORAL
  Filled 2018-12-15 (×2): qty 1

## 2018-12-15 MED ORDER — MIDAZOLAM HCL 5 MG/5ML IJ SOLN
INTRAMUSCULAR | Status: DC | PRN
  Administered 2018-12-15: 1 mg via INTRAVENOUS

## 2018-12-15 MED ORDER — DEXAMETHASONE SODIUM PHOSPHATE 10 MG/ML IJ SOLN
INTRAMUSCULAR | Status: DC | PRN
  Administered 2018-12-15: 10 mg via INTRAVENOUS

## 2018-12-15 MED ORDER — LIDOCAINE 2% (20 MG/ML) 5 ML SYRINGE
INTRAMUSCULAR | Status: AC
  Filled 2018-12-15: qty 5

## 2018-12-15 MED ORDER — DIPHENHYDRAMINE HCL 12.5 MG/5ML PO ELIX: 12.5000 mg | ORAL_SOLUTION | ORAL | Status: DC | PRN

## 2018-12-15 MED ORDER — PROMETHAZINE HCL 25 MG/ML IJ SOLN: 6.2500 mg | INTRAMUSCULAR | Status: DC | PRN

## 2018-12-15 MED ORDER — FENTANYL CITRATE (PF) 100 MCG/2ML IJ SOLN
INTRAMUSCULAR | Status: DC | PRN
  Administered 2018-12-15 (×2): 50 ug via INTRAVENOUS

## 2018-12-15 MED ORDER — PROPOFOL 500 MG/50ML IV EMUL
INTRAVENOUS | Status: DC | PRN
  Administered 2018-12-15: 50 ug/kg/min via INTRAVENOUS

## 2018-12-15 MED ORDER — ONDANSETRON HCL 4 MG/2ML IJ SOLN
4.0000 mg | Freq: Four times a day (QID) | INTRAMUSCULAR | Status: DC | PRN
  Administered 2018-12-15: 4 mg via INTRAVENOUS
  Filled 2018-12-15: qty 2

## 2018-12-15 MED ORDER — METHOCARBAMOL 500 MG IVPB - SIMPLE MED
500.0000 mg | Freq: Four times a day (QID) | INTRAVENOUS | Status: DC | PRN
  Administered 2018-12-15: 500 mg via INTRAVENOUS
  Filled 2018-12-15: qty 50

## 2018-12-15 MED ORDER — HYDROCODONE-ACETAMINOPHEN 5-325 MG PO TABS
1.0000 | ORAL_TABLET | ORAL | Status: DC | PRN
  Administered 2018-12-15 – 2018-12-16 (×3): 2 via ORAL
  Filled 2018-12-15 (×3): qty 2

## 2018-12-15 MED ORDER — ASPIRIN 81 MG PO CHEW
81.0000 mg | CHEWABLE_TABLET | Freq: Two times a day (BID) | ORAL | Status: DC
  Administered 2018-12-15 – 2018-12-16 (×2): 81 mg via ORAL
  Filled 2018-12-15 (×2): qty 1

## 2018-12-15 MED ORDER — LIDOCAINE HCL (CARDIAC) PF 100 MG/5ML IV SOSY
PREFILLED_SYRINGE | INTRAVENOUS | Status: DC | PRN
  Administered 2018-12-15: 30 mg via INTRAVENOUS

## 2018-12-15 MED ORDER — DOCUSATE SODIUM 100 MG PO CAPS
100.0000 mg | ORAL_CAPSULE | Freq: Two times a day (BID) | ORAL | Status: DC
  Administered 2018-12-15 – 2018-12-16 (×2): 100 mg via ORAL
  Filled 2018-12-15 (×2): qty 1

## 2018-12-15 MED ORDER — CEFAZOLIN SODIUM-DEXTROSE 1-4 GM/50ML-% IV SOLN
1.0000 g | Freq: Four times a day (QID) | INTRAVENOUS | Status: AC
  Administered 2018-12-15 (×2): 1 g via INTRAVENOUS
  Filled 2018-12-15 (×2): qty 50

## 2018-12-15 MED ORDER — HYDROMORPHONE HCL 1 MG/ML IJ SOLN: 0.2500 mg | INTRAMUSCULAR | Status: DC | PRN

## 2018-12-15 MED ORDER — PANTOPRAZOLE SODIUM 40 MG PO TBEC
40.0000 mg | DELAYED_RELEASE_TABLET | Freq: Every day | ORAL | Status: DC
  Administered 2018-12-15 – 2018-12-16 (×2): 40 mg via ORAL
  Filled 2018-12-15 (×2): qty 1

## 2018-12-15 MED ORDER — MORPHINE SULFATE (PF) 2 MG/ML IV SOLN
0.5000 mg | INTRAVENOUS | Status: DC | PRN
  Administered 2018-12-15: 1 mg via INTRAVENOUS
  Filled 2018-12-15: qty 1

## 2018-12-15 MED ORDER — STERILE WATER FOR IRRIGATION IR SOLN
Status: DC | PRN
  Administered 2018-12-15: 2000 mL

## 2018-12-15 MED ORDER — ATORVASTATIN CALCIUM 40 MG PO TABS
40.0000 mg | ORAL_TABLET | Freq: Every day | ORAL | Status: DC
  Administered 2018-12-16: 40 mg via ORAL
  Filled 2018-12-15: qty 1

## 2018-12-15 MED ORDER — HYDROCODONE-ACETAMINOPHEN 7.5-325 MG PO TABS: 1.0000 | ORAL_TABLET | ORAL | Status: DC | PRN

## 2018-12-15 MED ORDER — ACETAMINOPHEN 325 MG PO TABS: 325.0000 mg | ORAL_TABLET | Freq: Four times a day (QID) | ORAL | Status: DC | PRN

## 2018-12-15 MED ORDER — ONDANSETRON HCL 4 MG PO TABS: 4.0000 mg | ORAL_TABLET | Freq: Four times a day (QID) | ORAL | Status: DC | PRN

## 2018-12-15 MED ORDER — CEFAZOLIN SODIUM-DEXTROSE 2-4 GM/100ML-% IV SOLN
2.0000 g | INTRAVENOUS | Status: AC
  Administered 2018-12-15: 2 g via INTRAVENOUS
  Filled 2018-12-15: qty 100

## 2018-12-15 MED ORDER — BUPIVACAINE IN DEXTROSE 0.75-8.25 % IT SOLN
INTRATHECAL | Status: DC | PRN
  Administered 2018-12-15: 13 mg via INTRATHECAL

## 2018-12-15 MED ORDER — METHOCARBAMOL 500 MG PO TABS: 500.0000 mg | ORAL_TABLET | Freq: Four times a day (QID) | ORAL | Status: DC | PRN

## 2018-12-15 MED ORDER — LACTATED RINGERS IV SOLN
INTRAVENOUS | Status: DC
  Administered 2018-12-15 (×2): via INTRAVENOUS

## 2018-12-15 MED ORDER — CHLORHEXIDINE GLUCONATE 4 % EX LIQD: 60.0000 mL | Freq: Once | CUTANEOUS | Status: DC

## 2018-12-15 MED ORDER — PHENOL 1.4 % MT LIQD: 1.0000 | OROMUCOSAL | Status: DC | PRN

## 2018-12-15 MED ORDER — SODIUM CHLORIDE 0.9 % IV SOLN
INTRAVENOUS | Status: DC
  Administered 2018-12-15: 10:00:00 via INTRAVENOUS

## 2018-12-15 MED ORDER — ALUM & MAG HYDROXIDE-SIMETH 200-200-20 MG/5ML PO SUSP: 30.0000 mL | ORAL | Status: DC | PRN

## 2018-12-15 MED ORDER — FENTANYL CITRATE (PF) 100 MCG/2ML IJ SOLN
INTRAMUSCULAR | Status: AC
  Filled 2018-12-15: qty 2

## 2018-12-15 MED ORDER — ONDANSETRON HCL 4 MG/2ML IJ SOLN
INTRAMUSCULAR | Status: DC | PRN
  Administered 2018-12-15: 4 mg via INTRAVENOUS

## 2018-12-15 MED ORDER — METHOCARBAMOL 500 MG IVPB - SIMPLE MED
INTRAVENOUS | Status: AC
  Filled 2018-12-15: qty 50

## 2018-12-15 MED ORDER — TRAMADOL HCL 50 MG PO TABS
50.0000 mg | ORAL_TABLET | Freq: Four times a day (QID) | ORAL | Status: DC
  Administered 2018-12-15 – 2018-12-16 (×5): 50 mg via ORAL
  Filled 2018-12-15 (×5): qty 1

## 2018-12-15 MED ORDER — KETOROLAC TROMETHAMINE 15 MG/ML IJ SOLN: 15.0000 mg | Freq: Once | INTRAMUSCULAR | Status: DC

## 2018-12-15 MED ORDER — MENTHOL 3 MG MT LOZG: 1.0000 | LOZENGE | OROMUCOSAL | Status: DC | PRN

## 2018-12-15 MED ORDER — MEPERIDINE HCL 50 MG/ML IJ SOLN: 6.2500 mg | INTRAMUSCULAR | Status: DC | PRN

## 2018-12-15 MED ORDER — METOCLOPRAMIDE HCL 5 MG PO TABS: 5.0000 mg | ORAL_TABLET | Freq: Three times a day (TID) | ORAL | Status: DC | PRN

## 2018-12-15 MED ORDER — SODIUM CHLORIDE 0.9 % IR SOLN
Status: DC | PRN
  Administered 2018-12-15: 1000 mL

## 2018-12-15 MED ORDER — PROPOFOL 10 MG/ML IV BOLUS
INTRAVENOUS | Status: AC
  Filled 2018-12-15: qty 40

## 2018-12-15 MED ORDER — MIDAZOLAM HCL 2 MG/2ML IJ SOLN
INTRAMUSCULAR | Status: AC
  Filled 2018-12-15: qty 2

## 2018-12-15 MED ORDER — METOCLOPRAMIDE HCL 5 MG/ML IJ SOLN: 5.0000 mg | Freq: Three times a day (TID) | INTRAMUSCULAR | Status: DC | PRN

## 2018-12-15 MED ORDER — ZOLPIDEM TARTRATE 5 MG PO TABS: 5.0000 mg | ORAL_TABLET | Freq: Every evening | ORAL | Status: DC | PRN

## 2018-12-15 MED ORDER — TRANEXAMIC ACID-NACL 1000-0.7 MG/100ML-% IV SOLN
1000.0000 mg | INTRAVENOUS | Status: AC
  Administered 2018-12-15: 1000 mg via INTRAVENOUS
  Filled 2018-12-15: qty 100

## 2018-12-15 MED ORDER — LOSARTAN POTASSIUM 25 MG PO TABS
25.0000 mg | ORAL_TABLET | Freq: Every day | ORAL | Status: DC
  Administered 2018-12-16: 25 mg via ORAL
  Filled 2018-12-15: qty 1

## 2018-12-15 SURGICAL SUPPLY — 39 items
BAG ZIPLOCK 12X15 (MISCELLANEOUS) IMPLANT
BENZOIN TINCTURE PRP APPL 2/3 (GAUZE/BANDAGES/DRESSINGS) ×2 IMPLANT
BLADE SAW SGTL 18X1.27X75 (BLADE) ×2 IMPLANT
BLADE SURG SZ10 CARB STEEL (BLADE) ×4 IMPLANT
COVER PERINEAL POST (MISCELLANEOUS) ×2 IMPLANT
COVER SURGICAL LIGHT HANDLE (MISCELLANEOUS) ×2 IMPLANT
COVER WAND RF STERILE (DRAPES) ×2 IMPLANT
DRAPE STERI IOBAN 125X83 (DRAPES) ×2 IMPLANT
DRAPE U-SHAPE 47X51 STRL (DRAPES) ×4 IMPLANT
DRESSING AQUACEL AG SP 3.5X10 (GAUZE/BANDAGES/DRESSINGS) ×1 IMPLANT
DRSG AQUACEL AG ADV 3.5X10 (GAUZE/BANDAGES/DRESSINGS) ×2 IMPLANT
DRSG AQUACEL AG SP 3.5X10 (GAUZE/BANDAGES/DRESSINGS) ×2
DURAPREP 26ML APPLICATOR (WOUND CARE) ×2 IMPLANT
ELECT REM PT RETURN 15FT ADLT (MISCELLANEOUS) ×2 IMPLANT
GAUZE XEROFORM 1X8 LF (GAUZE/BANDAGES/DRESSINGS) IMPLANT
GLOVE BIO SURGEON STRL SZ7.5 (GLOVE) ×2 IMPLANT
GLOVE BIOGEL PI IND STRL 8 (GLOVE) ×2 IMPLANT
GLOVE BIOGEL PI INDICATOR 8 (GLOVE) ×2
GLOVE ECLIPSE 8.0 STRL XLNG CF (GLOVE) ×2 IMPLANT
GOWN STRL REUS W/TWL XL LVL3 (GOWN DISPOSABLE) ×4 IMPLANT
HANDPIECE INTERPULSE COAX TIP (DISPOSABLE) ×1
HEAD M SROM 36MM PLUS 1.5 (Hips) ×1 IMPLANT
HOLDER FOLEY CATH W/STRAP (MISCELLANEOUS) ×2 IMPLANT
LINER ACETAB NEUTRAL 36ID 520D (Liner) ×2 IMPLANT
PACK ANTERIOR HIP CUSTOM (KITS) ×2 IMPLANT
PIN SECTOR W/GRIP ACE CUP 52MM (Hips) ×2 IMPLANT
SET HNDPC FAN SPRY TIP SCT (DISPOSABLE) ×1 IMPLANT
SROM M HEAD 36MM PLUS 1.5 (Hips) ×2 IMPLANT
STAPLER VISISTAT 35W (STAPLE) IMPLANT
STEM CORAIL KA11 (Stem) ×2 IMPLANT
STRIP CLOSURE SKIN 1/2X4 (GAUZE/BANDAGES/DRESSINGS) ×2 IMPLANT
SUT ETHIBOND NAB CT1 #1 30IN (SUTURE) ×2 IMPLANT
SUT MNCRL AB 4-0 PS2 18 (SUTURE) ×2 IMPLANT
SUT VIC AB 0 CT1 36 (SUTURE) ×2 IMPLANT
SUT VIC AB 1 CT1 36 (SUTURE) ×2 IMPLANT
SUT VIC AB 2-0 CT1 27 (SUTURE) ×2
SUT VIC AB 2-0 CT1 TAPERPNT 27 (SUTURE) ×2 IMPLANT
TRAY FOLEY MTR SLVR 16FR STAT (SET/KITS/TRAYS/PACK) ×2 IMPLANT
YANKAUER SUCT BULB TIP 10FT TU (MISCELLANEOUS) ×2 IMPLANT

## 2018-12-15 NOTE — Brief Op Note (Signed)
12/15/2018  8:31 AM  PATIENT:  Tanya PrudePatricia A Cortez  67 y.o. female  PRE-OPERATIVE DIAGNOSIS:  Osteoarthritis left hip  POST-OPERATIVE DIAGNOSIS:  Osteoarthritis left hip  PROCEDURE:  Procedure(s): LEFT TOTAL HIP ARTHROPLASTY ANTERIOR APPROACH (Left)  SURGEON:  Surgeon(s) and Role:    Kathryne Hitch* Palak Tercero Y, MD - Primary  PHYSICIAN ASSISTANT: Rexene EdisonGil Clark, PA-C  ANESTHESIA:   spinal  EBL:  150 mL   COUNTS:  YES  DICTATION: .Other Dictation: Dictation Number [id number]  PLAN OF CARE: Admit to inpatient   PATIENT DISPOSITION:  PACU - hemodynamically stable.   Delay start of Pharmacological VTE agent (>24hrs) due to surgical blood loss or risk of bleeding: no

## 2018-12-15 NOTE — Anesthesia Postprocedure Evaluation (Signed)
Anesthesia Post Note  Patient: Tanya Cortez  Procedure(s) Performed: LEFT TOTAL HIP ARTHROPLASTY ANTERIOR APPROACH (Left Hip)     Patient location during evaluation: PACU Anesthesia Type: Spinal Level of consciousness: awake Pain management: pain level controlled Vital Signs Assessment: post-procedure vital signs reviewed and stable Respiratory status: spontaneous breathing Cardiovascular status: stable Postop Assessment: no apparent nausea or vomiting Anesthetic complications: no    Last Vitals:  Vitals:   12/15/18 0900 12/15/18 0915  BP: 117/71 135/70  Pulse: (!) 55 70  Resp: 12 17  Temp:    SpO2: 100% 98%    Last Pain:  Vitals:   12/15/18 0915  TempSrc:   PainSc: 3    Pain Goal:                 Caren MacadamJohn F Avri Paiva Jr

## 2018-12-15 NOTE — Evaluation (Signed)
Physical Therapy Evaluation Patient Details Name: Tanya Cortez MRN: [id number] DOB: 1951-08-23 Today's Date: 12/15/2018   History of Present Illness  Pt s/p L THR   Clinical Impression  Pt s/p L THR and presents with decreased L LE strength/ROM and post op pain limiting functional mobility.  Pt should progress to dc home with assist of family and friends.      Follow Up Recommendations Follow surgeon's recommendation for DC plan and follow-up therapies    Equipment Recommendations  Rolling walker with 5" wheels    Recommendations for Other Services       Precautions / Restrictions Precautions Precautions: Fall Restrictions Weight Bearing Restrictions: No Other Position/Activity Restrictions: WBAT      Mobility  Bed Mobility Overal bed mobility: Needs Assistance Bed Mobility: Supine to Sit     Supine to sit: Min assist     General bed mobility comments: cues for sequence and use of R LE to self assist  Transfers Overall transfer level: Needs assistance Equipment used: Rolling walker (2 wheeled) Transfers: Sit to/from Stand Sit to Stand: Min assist         General transfer comment: cues for LE management and use of UEs to self assist  Ambulation/Gait Ambulation/Gait assistance: Min assist Gait Distance (Feet): 48 Feet Assistive device: Rolling walker (2 wheeled) Gait Pattern/deviations: Step-to pattern;Decreased step length - right;Decreased step length - left;Shuffle;Trunk flexed Gait velocity: decr   General Gait Details: cues for sequence, posture and position from [employer]  Stairs            Wheelchair Mobility    Modified Rankin (Stroke Patients Only)       Balance Overall balance assessment: Mild deficits observed, not formally tested                                           Pertinent Vitals/Pain Pain Assessment: 0-10 Pain Score: 3  Pain Location: L hip Pain Descriptors / Indicators: Aching;Sore Pain  Intervention(s): Limited activity within patient's tolerance;Monitored during session;Premedicated before session;Ice applied    Home Living Family/patient expects to be discharged to:: Private residence Living Arrangements: Other relatives;Non-relatives/Friends Available Help at Discharge: Family Type of Home: House Home Access: Level entry     Home Layout: Able to live on main level with bedroom/bathroom Home Equipment: None      Prior Function Level of Independence: Independent               Hand Dominance        Extremity/Trunk Assessment   Upper Extremity Assessment Upper Extremity Assessment: Overall WFL for tasks assessed    Lower Extremity Assessment Lower Extremity Assessment: LLE deficits/detail       Communication   Communication: No difficulties  Cognition Arousal/Alertness: Awake/alert Behavior During Therapy: WFL for tasks assessed/performed Overall Cognitive Status: Within Functional Limits for tasks assessed                                        General Comments      Exercises Total Joint Exercises Ankle Circles/Pumps: AROM;Both;20 reps;Supine   Assessment/Plan    PT Assessment Patient needs continued PT services  PT Problem List Decreased strength;Decreased range of motion;Decreased activity tolerance;Decreased mobility;Pain;Decreased knowledge of use of DME       PT Treatment Interventions  DME instruction;Gait training;Stair training;Functional mobility training;Therapeutic activities;Therapeutic exercise;Patient/family education    PT Goals (Current goals can be found in the Care Plan section)  Acute Rehab PT Goals Patient Stated Goal: Get back to golfing PT Goal Formulation: With patient Time For Goal Achievement: 12/15/18 Potential to Achieve Goals: Good    Frequency 7X/week   Barriers to discharge        Co-evaluation               AM-PAC PT "6 Clicks" Mobility  Outcome Measure Help needed  turning from your back to your side while in a flat bed without using bedrails?: A Little Help needed moving from lying on your back to sitting on the side of a flat bed without using bedrails?: A Little Help needed moving to and from a bed to a chair (including a wheelchair)?: A Little Help needed standing up from a chair using your arms (e.g., wheelchair or bedside chair)?: A Little Help needed to walk in hospital room?: A Little Help needed climbing 3-5 steps with a railing? : A Little 6 Click Score: 18    End of Session Equipment Utilized During Treatment: Gait belt Activity Tolerance: Patient tolerated treatment well Patient left: in chair;with call bell/phone within reach;with family/visitor present Nurse Communication: Mobility status PT Visit Diagnosis: Difficulty in walking, not elsewhere classified (R26.2)    Time: [id number] PT Time Calculation (min) (ACUTE ONLY): 26 min   Charges:   PT Evaluation $PT Eval Low Complexity: 1 Low PT Treatments $Gait Training: 8-22 mins        Tanya Cortez PT Acute Rehabilitation Services Pager [phone] Office [phone]   Tanya Cortez 12/15/2018, 2:28 PM

## 2018-12-15 NOTE — Addendum Note (Signed)
Addendum  created 12/15/18 0946 by Garth BignessFlynn-Cook, Pantelis Elgersma A, CRNA   Intraprocedure Meds edited

## 2018-12-15 NOTE — Progress Notes (Signed)
Patient c/o feeling bladder distention. Foley in place, noted urine draining into bag. Bladder scan performed, in bladder. Assessed catheter at skin site, noted catheter kinked at entry. Straightened and urine flowing freely. More than drained quickly. Report given to oncoming shift.

## 2018-12-15 NOTE — Transfer of Care (Signed)
Immediate Anesthesia Transfer of Care Note  Patient: Tanya Cortez  Procedure(s) Performed: LEFT TOTAL HIP ARTHROPLASTY ANTERIOR APPROACH (Left Hip)  Patient Location: PACU  Anesthesia Type:Spinal  Level of Consciousness: awake, alert  and oriented  Airway & Oxygen Therapy: Patient Spontanous Breathing and Patient connected to face mask oxygen  Post-op Assessment: Report given to RN and Post -op Vital signs reviewed and stable  Post vital signs: Reviewed and stable  Last Vitals:  Vitals Value Taken Time  BP 109/89 12/15/2018  8:50 AM  Temp    Pulse 61 12/15/2018  8:51 AM  Resp 17 12/15/2018  8:51 AM  SpO2 100 % 12/15/2018  8:51 AM  Vitals shown include unvalidated device data.  Last Pain:  Vitals:   12/15/18 0547  TempSrc: Oral         Complications: No apparent anesthesia complications

## 2018-12-15 NOTE — H&P (Signed)
TOTAL HIP ADMISSION H&P  Patient is admitted for left total hip arthroplasty.  Subjective:  Chief Complaint: left hip pain  HPI: Tanya Cortez, 67 y.o. female, has a history of pain and functional disability in the left hip(s) due to arthritis and patient has failed non-surgical conservative treatments for greater than 12 weeks to include NSAID's and/or analgesics, corticosteriod injections, use of assistive devices and activity modification.  Onset of symptoms was gradual starting 3 years ago with gradually worsening course since that time.The patient noted no past surgery on the left hip(s).  Patient currently rates pain in the left hip at 10 out of 10 with activity. Patient has night pain, worsening of pain with activity and weight bearing, trendelenberg gait, pain that interfers with activities of daily living and pain with passive range of motion. Patient has evidence of subchondral sclerosis, periarticular osteophytes and joint space narrowing by imaging studies. This condition presents safety issues increasing the risk of falls.  There is no current active infection.  Patient Active Problem List   Diagnosis Date Noted  . Unilateral primary osteoarthritis, left hip 12/15/2018  . Degenerative joint disease (DJD) of hip 06/13/2018  . Osteopenia determined by x-ray 04/20/2018  . Tendonitis involving hip abductors 04/20/2018  . Heart murmur 05/24/2014  . Hyperlipidemia 04/22/2014  . Essential hypertension, benign 04/22/2014   Past Medical History:  Diagnosis Date  . Degenerative joint disease (DJD) of hip 06/13/2018  . Essential hypertension, benign 04/22/2014  . Hyperlipidemia 04/22/2014  . Osteopenia determined by x-ray 04/20/2018   T score -2.1 on Dexa scan 2017    Past Surgical History:  Procedure Laterality Date  . TONSILLECTOMY      Current Facility-Administered Medications  Medication Dose Route Frequency Provider Last Rate Last Dose  . ceFAZolin (ANCEF) IVPB 2g/100 mL  premix  2 g Intravenous On Call to OR Kirtland Bouchardlark, Gilbert W, PA-C      . chlorhexidine (HIBICLENS) 4 % liquid 4 application  60 mL Topical Once Richardean Canallark, Gilbert W, PA-C      . lactated ringers infusion   Intravenous Continuous Hatchett, Susann GivensFranklin, MD      . tranexamic acid (CYKLOKAPRON) IVPB 1,000 mg  1,000 mg Intravenous To OR Kirtland Bouchardlark, Gilbert W, PA-C       No Known Allergies  Social History   Tobacco Use  . Smoking status: Never Smoker  . Smokeless tobacco: Never Used  Substance Use Topics  . Alcohol use: No    Family History  Problem Relation Age of Onset  . Skin cancer Mother        squamous cell   . Hypertension Mother   . Breast cancer Mother   . COPD Mother   . Heart attack Maternal Grandfather   . Stroke Maternal Grandfather   . Heart attack Father        died 68 yo  . Hypertension Sister      Review of Systems  Musculoskeletal: Positive for joint pain.  All other systems reviewed and are negative.   Objective:  Physical Exam  Constitutional: She is oriented to person, place, and time. She appears well-developed and well-nourished.  HENT:  Head: Normocephalic and atraumatic.  Eyes: Pupils are equal, round, and reactive to light. EOM are normal.  Neck: Neck supple.  Cardiovascular: Normal rate.  Respiratory: Effort normal.  GI: Soft.  Musculoskeletal:     Left hip: She exhibits decreased range of motion, decreased strength, tenderness and bony tenderness.  Neurological: She is alert and oriented  to person, place, and time.  Skin: Skin is warm and dry.  Psychiatric: She has a normal mood and affect.    Vital signs in last 24 hours: Temp:  [98.8 F (37.1 C)] 98.8 F (37.1 C) (12/20 0547) Pulse Rate:  [73] 73 (12/20 0547) Resp:  [16] 16 (12/20 0547) BP: (136)/(66) 136/66 (12/20 0547) SpO2:  [100 %] 100 % (12/20 0547)  Labs:   Estimated body mass index is 23.52 kg/m as calculated from the following:   Height as of 12/08/18: 5\' 4"  (1.626 m).   Weight as of  12/08/18: 62.1 kg.   Imaging Review Plain radiographs demonstrate severe degenerative joint disease of the left hip(s). The bone quality appears to be good for age and reported activity level.    Preoperative templating of the joint replacement has been completed, documented, and submitted to the Operating Room personnel in order to optimize intra-operative equipment management.     Assessment/Plan:  End stage arthritis, left hip(s)  The patient history, physical examination, clinical judgement of the provider and imaging studies are consistent with end stage degenerative joint disease of the left hip(s) and total hip arthroplasty is deemed medically necessary. The treatment options including medical management, injection therapy, arthroscopy and arthroplasty were discussed at length. The risks and benefits of total hip arthroplasty were presented and reviewed. The risks due to aseptic loosening, infection, stiffness, dislocation/subluxation,  thromboembolic complications and other imponderables were discussed.  The patient acknowledged the explanation, agreed to proceed with the plan and consent was signed. Patient is being admitted for inpatient treatment for surgery, pain control, PT, OT, prophylactic antibiotics, VTE prophylaxis, progressive ambulation and ADL's and discharge planning.The patient is planning to be discharged home with home health services

## 2018-12-15 NOTE — Op Note (Signed)
NAME: Tanya Cortez, Tanya A. MEDICAL RECORD [id number] ACCOUNT [id number] DATE OF BIRTH:[birth year] FACILITY: [facility] LOCATION: [facility] PHYSICIAN:Jaymarion Trombly Aretha ParrotY. Jordann Grime, MD  OPERATIVE REPORT  DATE OF PROCEDURE:  12/15/2018  PREOPERATIVE DIAGNOSIS:  Primary osteoarthritis and degenerative joint disease, left hip.  POSTOPERATIVE DIAGNOSIS:  Primary osteoarthritis and degenerative joint disease, left hip.  PROCEDURE:  Left total hip arthroplasty through direct anterior approach.  IMPLANTS:  DePuy Sector Gription acetabular component size 52, size 36+0 polyethylene liner, size 12 Corail femoral component with standard offset, size 36+1.5 metal hip ball.  SURGEON:  Vanita PandaChristopher Y. Magnus IvanBlackman, MD  ASSISTANT:  Richardean CanalGilbert Clark, PA-C.  ANESTHESIA:  Spinal.  ANTIBIOTICS:  Two grams IV Ancef.  ESTIMATED BLOOD LOSS:  200 mL  COMPLICATIONS:  None.  INDICATIONS:  The patient is a 67 year old patient well known to me.  She has debilitating arthritis involving her left hip that is well documented.  She has tried and failed all forms of conservative treatment and at this point, does wish to proceed  with total hip arthroplasty with a direct anterior approach.  We talked in detail about the risk of acute blood loss anemia, nerve or vessel injury, fracture, infection, dislocation, DVT.  She understands her goals are to decrease pain, improve mobility  and overall improve quality of life.  DESCRIPTION OF PROCEDURE:  After informed consent was obtained and appropriate left hip was marked, she was brought to the operating room, sat up on a stretcher and spinal anesthesia was obtained.  She was then laid in the supine position on a stretcher.   Foley catheter was placed and traction boots were placed on both her feet.  Next, she was placed supine on the Hana fracture table, the perineal post in place and both legs in line skeletal traction device and no traction applied.  Her left operative  hip was prepped  and draped with DuraPrep and sterile drapes.  A time-out was called to identify correct patient, correct left hip.  We then made an incision just inferior and posterior to the anterior superior iliac spine and carried this obliquely down  the leg.  We dissected down tensor fascia lata muscle.  Tensor fascia was then divided longitudinally to proceed with direct anterior approach to the hip.  We identified and cauterized circumflex vessels and identified the hip capsule, opened the hip  capsule in an L-type format, finding moderate joint effusion and significant periarticular osteophytes around the femoral head and neck area.  We placed curved retractors around the medial and lateral femoral neck and then made our femoral neck cut with  an oscillating saw and completed this with an osteotome.  We placed a corkscrew guide in the femoral head and removed the femoral head in its entirety.  We found a wide area devoid of cartilage.  I then placed a bent Hohmann over the medial acetabular  rim and removed remnants of the acetabular labrum and other debris and then began reaming under direct visualization from a size 44 reamer in stepwise increments up to a size 51 with all reamers under direct visualization, the last reamer under direct  fluoroscopy, so I could obtain our depth of reaming our inclination and anteversion.  I then placed the real DePuy Sector Gription acetabular component size 52 and a 36+0 neutral polyethylene liner for that size 52 acetabular component.  Attention was  then turned to the femur.  With the leg externally rotated to 120 degrees, extended and adducted we were able to place a Mueller retractor medially  and a Hohmann retractor behind the greater trochanter, released lateral joint capsule and used a  box-cutting osteotome to enter femoral canal and a rongeur to lateralize then began broaching from a size 8 broach using Corail broaching system going up to a size 11.  With a size 11 in  place, we trialed a standard offset femoral neck and 36+1.5 hip  ball, reduced this in the acetabulum.  We were pleased with the leg length, offset, range of motion and stability under direct visualization and fluoroscopy.  We then dislocated the hip and removed the trial components.  We placed the real Corail femoral  component size 11 with standard offset and the real 36+1.5 metal hip ball again reduced this in the acetabulum.  We were pleased with stability.  We then irrigated the soft tissue with normal saline solution using pulsatile lavage.  We assessed the leg  one more time under fluoroscopy and pleased with the position of the implants.  We then closed the joint capsule with #1 Ethibond suture, followed by running 0 Vicryl and tensor fascia, 0 Vicryl in the deep tissue, 2-0 Vicryl subcutaneous tissue, 4-0  Monocryl subcuticular stitch and Steri-Strips on the skin.  An Aquacel dressing was applied.  She was taken off the Hana table and taken to recovery room in stable condition.  All final counts were correct.  There were no complications noted.  Of note,  Rexene EdisonGil Clark, PA-C, assisted the entire case.  His assistance was crucial for facilitating all aspects of this case.  TN/NUANCE  D:12/15/2018 T:12/15/2018 JOB:[id number]

## 2018-12-15 NOTE — Anesthesia Procedure Notes (Signed)
Spinal  Start time: 12/15/2018 7:21 AM End time: 12/15/2018 7:28 AM Staffing Resident/CRNA: Garth BignessFlynn-Cook, Kannon Baum A, CRNA Performed: resident/CRNA  Preanesthetic Checklist Completed: patient identified, site marked, surgical consent, pre-op evaluation, timeout performed, IV checked, risks and benefits discussed and monitors and equipment checked Spinal Block Patient position: sitting Prep: Betadine Patient monitoring: heart rate, continuous pulse ox and blood pressure Approach: midline Location: L4-5 Injection technique: single-shot Needle Needle type: Pencan  Needle gauge: 24 G Needle length: 9 cm Needle insertion depth: 5 cm Assessment Sensory level: T8

## 2018-12-16 LAB — CBC
HCT: 33.2 % — ABNORMAL LOW (ref 36.0–46.0)
Hemoglobin: 10.6 g/dL — ABNORMAL LOW (ref 12.0–15.0)
MCH: 30.8 pg (ref 26.0–34.0)
MCHC: 31.9 g/dL (ref 30.0–36.0)
MCV: 96.5 fL (ref 80.0–100.0)
Platelets: 158 10*3/uL (ref 150–400)
RBC: 3.44 MIL/uL — ABNORMAL LOW (ref 3.87–5.11)
RDW: 12.2 % (ref 11.5–15.5)
WBC: 10.6 10*3/uL — AB (ref 4.0–10.5)
nRBC: 0 % (ref 0.0–0.2)

## 2018-12-16 LAB — BASIC METABOLIC PANEL
Anion gap: 13 (ref 5–15)
BUN: 13 mg/dL (ref 8–23)
CALCIUM: 8.6 mg/dL — AB (ref 8.9–10.3)
CHLORIDE: 104 mmol/L (ref 98–111)
CO2: 23 mmol/L (ref 22–32)
CREATININE: 0.62 mg/dL (ref 0.44–1.00)
GFR calc Af Amer: >60 mL/min (ref >60)
GFR calc non Af Amer: >60 mL/min (ref >60)
Glucose, Bld: 115 mg/dL — ABNORMAL HIGH (ref 70–99)
Potassium: 4.1 mmol/L (ref 3.5–5.1)
Sodium: 140 mmol/L (ref 135–145)

## 2018-12-16 MED ORDER — METHOCARBAMOL 500 MG PO TABS: 500.0000 mg | ORAL_TABLET | Freq: Four times a day (QID) | ORAL | 1 refills | Status: DC | PRN

## 2018-12-16 MED ORDER — HYDROCODONE-ACETAMINOPHEN 5-325 MG PO TABS: 1.0000 | ORAL_TABLET | ORAL | 0 refills | Status: DC | PRN

## 2018-12-16 MED ORDER — ASPIRIN 81 MG PO CHEW: 81.0000 mg | CHEWABLE_TABLET | Freq: Two times a day (BID) | ORAL | 0 refills | Status: DC

## 2018-12-16 NOTE — Care Management Note (Signed)
Case Management Note  Patient Details  Name: Veronda Prudeatricia A Pietrzak MRN: [id number] Date of Birth: 1951-06-08  Subjective/Objective:   S/p L THR                 Action/Plan: NCM spoke to pt and sister/friend at bedside. Offered choice for HH/CMS list provided/placed on chart. [facility] Contacted [facility] for RW and 3n1, delivered to room.    Expected Discharge Date:  12/16/18               Expected Discharge Plan:  Home w Home Health Services  In-House Referral:  NA  Discharge planning Services  CM Consult  Post Acute Care Choice:  Home Health Choice offered to:  Patient  DME Arranged:  3-N-1, Walker rolling DME Agency:  [facility]  HH Arranged:  PT HH [facility]  Status of Service:  Completed, signed off  If discussed at [employer] Meetings, dates discussed:    Additional Comments:  Elliot CousinShavis, Nachmen Mansel Ellen, RN 12/16/2018, 1:11 PM

## 2018-12-16 NOTE — Discharge Summary (Signed)
Patient ID: Tanya Cortez MRN: [id number] DOB/AGE: [birth year] 67 y.o.  Admit date: 12/15/2018 Discharge date: 12/16/2018  Admission Diagnoses:  Principal Problem:   Unilateral primary osteoarthritis, left hip Active Problems:   Status post total replacement of left hip   Discharge Diagnoses:  Same  Past Medical History:  Diagnosis Date  . Degenerative joint disease (DJD) of hip 06/13/2018  . Essential hypertension, benign 04/22/2014  . Hyperlipidemia 04/22/2014  . Osteopenia determined by x-ray 04/20/2018   T score -2.1 on Dexa scan 2017    Surgeries: Procedure(s): LEFT TOTAL HIP ARTHROPLASTY ANTERIOR APPROACH on 12/15/2018   Consultants:   Discharged Condition: Improved  Hospital Course: Tanya Cortez is an 67 y.o. female who was admitted 12/15/2018 for operative treatment ofUnilateral primary osteoarthritis, left hip. Patient has severe unremitting pain that affects sleep, daily activities, and work/hobbies. After pre-op clearance the patient was taken to the operating room on 12/15/2018 and underwent  Procedure(s): LEFT TOTAL HIP ARTHROPLASTY ANTERIOR APPROACH.    Patient was given perioperative antibiotics:  Anti-infectives (From admission, onward)   Start     Dose/Rate Route Frequency Ordered Stop   12/15/18 1300  ceFAZolin (ANCEF) IVPB 1 g/50 mL premix     1 g 100 mL/hr over 30 Minutes Intravenous Every 6 hours 12/15/18 0952 12/15/18 1854   12/15/18 0600  ceFAZolin (ANCEF) IVPB 2g/100 mL premix     2 g 200 mL/hr over 30 Minutes Intravenous On call to O.R. 12/15/18 [id number] 12/15/18 0729       Patient was given sequential compression devices, early ambulation, and chemoprophylaxis to prevent DVT.  Patient benefited maximally from hospital stay and there were no complications.    Recent vital signs:  Patient Vitals for the past 24 hrs:  BP Temp Temp src Pulse Resp SpO2 Height Weight  12/16/18 0522 110/64 98.2 F (36.8 C) Oral 68 14 100 % - -  12/16/18  0152 (!) 103/57 (!) 97.3 F (36.3 C) Oral 67 14 100 % - -  12/15/18 2142 110/68 97.8 F (36.6 C) Oral 69 14 99 % - -  12/15/18 1815 103/63 98.7 F (37.1 C) - 78 16 - - -  12/15/18 1306 130/74 (!) 97.4 F (36.3 C) Oral 63 16 100 % - -  12/15/18 1159 119/75 98 F (36.7 C) Oral 66 15 100 % - -  12/15/18 1107 - - - - - - 5\' 4"  (1.626 m) 62.1 kg  12/15/18 1102 131/62 (!) 97.4 F (36.3 C) Oral (!) 57 16 100 % - -  12/15/18 0948 111/63 97.7 F (36.5 C) Oral (!) 56 16 100 % - -  12/15/18 0930 - - - 60 14 100 % - -  12/15/18 0915 135/70 - - 70 17 98 % - -  12/15/18 0900 117/71 - - (!) 55 12 100 % - -  12/15/18 0849 109/89 97.9 F (36.6 C) - (!) 58 18 100 % - -     Recent laboratory studies:  Recent Labs    12/16/18 0437  WBC 10.6*  HGB 10.6*  HCT 33.2*  PLT 158  NA 140  K 4.1  CL 104  CO2 23  BUN 13  CREATININE 0.62  GLUCOSE 115*  CALCIUM 8.6*     Discharge Medications:   Allergies as of 12/16/2018   No Known Allergies     Medication List    TAKE these medications   alendronate 70 MG tablet Commonly known as:  FOSAMAX Take 1 tablet (70  mg total) by mouth every 7 (seven) days. Take with a full glass of water on an empty stomach.   aspirin 81 MG chewable tablet Chew 1 tablet (81 mg total) by mouth 2 (two) times daily.   atorvastatin 40 MG tablet Commonly known as:  LIPITOR Take 1 tablet (40 mg total) by mouth daily.   CALTRATE 600+D PLUS MINERALS 600-800 MG-UNIT Tabs Take 1 tablet by mouth 2 (two) times daily.   HYDROcodone-acetaminophen 5-325 MG tablet Commonly known as:  NORCO/VICODIN Take 1-2 tablets by mouth every 4 (four) hours as needed for moderate pain (pain score 4-6).   losartan 25 MG tablet Commonly known as:  COZAAR Take 1 tablet (25 mg total) by mouth daily.   methocarbamol 500 MG tablet Commonly known as:  ROBAXIN Take 1 tablet (500 mg total) by mouth every 6 (six) hours as needed for muscle spasms.            Durable Medical  Equipment  (From admission, onward)         Start     Ordered   12/15/18 0953  DME Walker rolling  Once    Question:  Patient needs a walker to treat with the following condition  Answer:  Status post total replacement of left hip   12/15/18 0952   12/15/18 0953  DME 3 n 1  Once     12/15/18 [id number]          Diagnostic Studies: Dg Pelvis Portable  Result Date: 12/15/2018 CLINICAL DATA:  Postop hip replacement. EXAM: PORTABLE PELVIS 1-2 VIEWS COMPARISON:  Same day FINDINGS: Total hip replacement on the left. Components appear well positioned. No radiographically detectable complication. Chronic osteoarthritis of the right hip. IMPRESSION: Good appearance following total hip replacement on the left. Electronically Signed   By: Paulina FusiMark  Shogry M.D.   On: 12/15/2018 09:20   Dg C-arm 1-60 Min-no Report  Result Date: 12/15/2018 Fluoroscopy was utilized by the requesting physician.  No radiographic interpretation.   Dg Hip Operative Unilat W Or W/o Pelvis Left  Result Date: 12/15/2018 CLINICAL DATA:  67 year old female with left hip replacement. Subsequent encounter. EXAM: OPERATIVE left HIP (WITH PELVIS IF PERFORMED) 5 VIEWS TECHNIQUE: Fluoroscopic spot image(s) were submitted for interpretation post-operatively. Fluoroscopic time: 18 seconds. COMPARISON:  10/26/2018. FINDINGS: Post total left hip replacement which appears in satisfactory position without complication noted on frontal projection. Right hip joint degenerative changes. IMPRESSION: Post total left hip replacement. Electronically Signed   By: Lacy DuverneySteven  Olson M.D.   On: 12/15/2018 08:56    Disposition:     Follow-up Information    Kathryne HitchBlackman, Christopher Y, MD. Schedule an appointment as soon as possible for a visit in 2 week(s).   Specialty:  Orthopedic Surgery Contact information: [address] [place] [address] [phone]            Signed: Richardean CanalGILBERT Myria Steenbergen 12/16/2018, 8:27 AM

## 2018-12-16 NOTE — Progress Notes (Signed)
Physical Therapy Treatment Patient Details Name: Tanya Cortez Kesling MRN: [id number] DOB: 02-10-1951 Today's Date: 12/16/2018    History of Present Illness Pt s/p L THR     PT Comments    Pt progressing well with mobility and eager for dc home.  Pt and sister reviewed car transfers, stairs and home therex program with written instruction provided.   Follow Up Recommendations  Follow surgeon's recommendation for DC plan and follow-up therapies     Equipment Recommendations  Rolling walker with 5" wheels    Recommendations for Other Services       Precautions / Restrictions Precautions Precautions: Fall Restrictions Weight Bearing Restrictions: No Other Position/Activity Restrictions: WBAT    Mobility  Bed Mobility Overal bed mobility: Needs Assistance Bed Mobility: Supine to Sit;Sit to Supine     Supine to sit: Min guard;Supervision Sit to supine: Min guard;Supervision   General bed mobility comments: cues for sequence and use of R LE to self assist  Transfers Overall transfer level: Needs assistance Equipment used: Rolling walker (2 wheeled) Transfers: Sit to/from Stand Sit to Stand: Min guard;Supervision         General transfer comment: cues for LE management and use of UEs to self assist  Ambulation/Gait Ambulation/Gait assistance: Min guard;Supervision Gait Distance (Feet): 200 Feet(and 15' into bathroom) Assistive device: Rolling walker (2 wheeled) Gait Pattern/deviations: Decreased step length - right;Decreased step length - left;Shuffle;Trunk flexed;Step-to pattern;Step-through pattern Gait velocity: decr   General Gait Details: cues for sequence, posture and position from [employer]   Stairs             Wheelchair Mobility    Modified Rankin (Stroke Patients Only)       Balance Overall balance assessment: Mild deficits observed, not formally tested                                          Cognition Arousal/Alertness:  Awake/alert Behavior During Therapy: WFL for tasks assessed/performed Overall Cognitive Status: Within Functional Limits for tasks assessed                                        Exercises Total Joint Exercises Ankle Circles/Pumps: AROM;Both;20 reps;Supine Quad Sets: AROM;Both;10 reps;Supine Heel Slides: AAROM;Left;20 reps;Supine Hip ABduction/ADduction: AAROM;Left;15 reps;Supine Long Arc Quad: AROM;Left;10 reps;Seated    General Comments        Pertinent Vitals/Pain Pain Assessment: 0-10 Pain Score: 3  Pain Location: L hip Pain Descriptors / Indicators: Aching;Sore Pain Intervention(s): Limited activity within patient's tolerance;Monitored during session;Premedicated before session;Ice applied    Home Living                      Prior Function            PT Goals (current goals can now be found in the care plan section) Acute Rehab PT Goals Patient Stated Goal: Get back to golfing PT Goal Formulation: With patient Time For Goal Achievement: 12/16/18 Potential to Achieve Goals: Good Progress towards PT goals: Progressing toward goals    Frequency    7X/week      PT Plan Current plan remains appropriate    Co-evaluation              AM-PAC PT "6 Clicks" Mobility   Outcome Measure  Help needed turning from your back to your side while in Cortez flat bed without using bedrails?: Cortez Little Help needed moving from lying on your back to sitting on the side of Cortez flat bed without using bedrails?: Cortez Little Help needed moving to and from Cortez bed to Cortez chair (including Cortez wheelchair)?: Cortez Little Help needed standing up from Cortez chair using your arms (e.g., wheelchair or bedside chair)?: Cortez Little Help needed to walk in hospital room?: Cortez Little Help needed climbing 3-5 steps with Cortez railing? : Cortez Little 6 Click Score: 18    End of Session Equipment Utilized During Treatment: Gait belt Activity Tolerance: Patient tolerated treatment well Patient  left: in chair;with call bell/phone within reach;with family/visitor present Nurse Communication: Mobility status PT Visit Diagnosis: Difficulty in walking, not elsewhere classified (R26.2)     Time: 0912-1000 PT Time Calculation (min) (ACUTE ONLY): 48 min  Charges:  $Gait Training: 8-22 mins $Therapeutic Exercise: 8-22 mins $Therapeutic Activity: 8-22 mins                     Mauro KaufmannHunter Kimmie Doren PT Acute Rehabilitation Services Pager [phone] Office [phone]    Amelia Macken 12/16/2018, 12:09 PM

## 2018-12-16 NOTE — Discharge Planning (Signed)
Reviewed with and family pt avs, discussed precautions at home, pain medication regimen, annd follow up appointments. Answered pt and family questions, reviewed new prescribed medications and times for next dose. Made sure patient took all belongings with her, took pt down in a wheelchair. Pt left in private vehicle with family.

## 2018-12-16 NOTE — Evaluation (Signed)
Occupational Therapy Evaluation Patient Details Name: Tanya Cortez MRN: [id number] DOB: 08-27-1951 Today's Date: 12/16/2018    History of Present Illness Pt s/p L THR    Clinical Impression   OT education complete regarding ADL activity s/p THA     Follow Up Recommendations  No OT follow up    Equipment Recommendations  3 in 1 bedside commode    Recommendations for Other Services       Precautions / Restrictions Precautions Precautions: Fall Restrictions Weight Bearing Restrictions: No Other Position/Activity Restrictions: WBAT      Mobility Bed Mobility Overal bed mobility: Needs Assistance Bed Mobility: Supine to Sit;Sit to Supine     Supine to sit: Supervision Sit to supine: Min guard;Supervision   General bed mobility comments: pt in chair  Transfers Overall transfer level: Needs assistance Equipment used: Rolling walker (2 wheeled) Transfers: Sit to/from Stand Sit to Stand: Min guard;Supervision         General transfer comment: cues for LE management and use of UEs to self assist    Balance Overall balance assessment: Mild deficits observed, not formally tested                                         ADL either performed or assessed with clinical judgement   ADL Overall ADL's : Needs assistance/impaired     Grooming: Supervision/safety;Standing   Upper Body Bathing: Set up;Sitting   Lower Body Bathing: Minimal assistance;Sit to/from stand;Cueing for sequencing;Cueing for safety   Upper Body Dressing : Set up;Sitting       Toilet Transfer: Minimal assistance;RW;Ambulation;Comfort height toilet   Toileting- Clothing Manipulation and Hygiene: Minimal assistance;Sit to/from stand;Cueing for safety   Tub/ Shower Transfer: Walk-in shower;Min guard   Functional mobility during ADLs: Supervision/safety General ADL Comments: sister will A as needed.  Education provided regarding ADL activty s/p THA     Vision  Patient Visual Report: No change from baseline              Pertinent Vitals/Pain Pain Assessment: 0-10 Pain Score: 2  Pain Location: L hip Pain Descriptors / Indicators: Aching Pain Intervention(s): Monitored during session              Cognition Arousal/Alertness: Awake/alert Behavior During Therapy: WFL for tasks assessed/performed Overall Cognitive Status: Within Functional Limits for tasks assessed                                                 OT Goals(Current goals can be found in the care plan section) Acute Rehab OT Goals Patient Stated Goal: Get back to golfing  OT Frequency:      AM-PAC OT "6 Clicks" Daily Activity     Outcome Measure Help from another person eating meals?: None Help from another person taking care of personal grooming?: None Help from another person toileting, which includes using toliet, bedpan, or urinal?: A Little Help from another person bathing (including washing, rinsing, drying)?: A Little Help from another person to put on and taking off regular upper body clothing?: None Help from another person to put on and taking off regular lower body clothing?: A Little 6 Click Score: 21   End of Session Equipment Utilized During Treatment: Rolling walker Nurse Communication: Mobility  status  Activity Tolerance: Patient tolerated treatment well Patient left: in chair;with call bell/phone within reach;with family/visitor present                   Time: 1115-1140 OT Time Calculation (min): 25 min Charges:  OT General Charges $OT Visit: 1 Visit OT Evaluation $OT Eval Low Complexity: 1 Low OT Treatments $Self Care/Home Management : 8-22 mins  Lise AuerLori Malana Eberwein, OT Acute Rehabilitation Services [phone]- [phone] Office- [phone]     Sidney Silberman, Karin GoldenLorraine D 12/16/2018, 2:16 PM

## 2018-12-16 NOTE — Discharge Instructions (Signed)

## 2018-12-16 NOTE — Progress Notes (Addendum)
Subjective: 1 Day Post-Op Procedure(s) (LRB): LEFT TOTAL HIP ARTHROPLASTY ANTERIOR APPROACH (Left) Patient reports pain as mild.  No complaints wants to go home after PT  Objective: Vital signs in last 24 hours: Temp:  [97.3 F (36.3 C)-98.7 F (37.1 C)] 98.2 F (36.8 C) (12/21 0522) Pulse Rate:  [55-78] 68 (12/21 0522) Resp:  [12-18] 14 (12/21 0522) BP: (103-135)/(57-89) 110/64 (12/21 0522) SpO2:  [98 %-100 %] 100 % (12/21 0522) Weight:  [62.1 kg] 62.1 kg (12/20 1107)  Intake/Output from previous day: 12/20 0701 - 12/21 0700 In: 3460.5 [P.O.:480; I.V.:2880.5; IV Piggyback:100] Out: 2650 [Urine:2500; Blood:150] Intake/Output this shift: No intake/output data recorded.  Recent Labs    12/16/18 0437  HGB 10.6*   Recent Labs    12/16/18 0437  WBC 10.6*  RBC 3.44*  HCT 33.2*  PLT 158   Recent Labs    12/16/18 0437  NA 140  K 4.1  CL 104  CO2 23  BUN 13  CREATININE 0.62  GLUCOSE 115*  CALCIUM 8.6*   No results for input(s): LABPT, INR in the last 72 hours.  Left hip: Neurovascular intact Dorsiflexion/Plantar flexion intact Incision: dressing C/D/I Compartment soft    Assessment/Plan: 1 Day Post-Op Procedure(s) (LRB): LEFT TOTAL HIP ARTHROPLASTY ANTERIOR APPROACH (Left) Up with therapy  Discharge to home after PT if patient remains stable and voiding well.     Tanya Cortez 12/16/2018, 8:21 AM

## 2018-12-18 ENCOUNTER — Encounter (HOSPITAL_COMMUNITY): Payer: Self-pay | Admitting: Orthopaedic Surgery

## 2018-12-21 ENCOUNTER — Encounter (INDEPENDENT_AMBULATORY_CARE_PROVIDER_SITE_OTHER): Payer: Self-pay

## 2018-12-21 ENCOUNTER — Other Ambulatory Visit: Payer: Self-pay | Admitting: Family Medicine

## 2018-12-28 ENCOUNTER — Ambulatory Visit (INDEPENDENT_AMBULATORY_CARE_PROVIDER_SITE_OTHER): Payer: Medicare Other | Admitting: Orthopaedic Surgery

## 2018-12-28 ENCOUNTER — Encounter: Payer: Self-pay | Admitting: Family Medicine

## 2018-12-28 ENCOUNTER — Encounter (INDEPENDENT_AMBULATORY_CARE_PROVIDER_SITE_OTHER): Payer: Self-pay | Admitting: Orthopaedic Surgery

## 2018-12-28 DIAGNOSIS — Z96642 Presence of left artificial hip joint: Secondary | ICD-10-CM

## 2018-12-28 NOTE — Progress Notes (Signed)
The patient is 2 weeks tomorrow status post a left total hip arthroplasty.  She is walking without assistive device.  She is a very pleasant and active 68 years old.  She has no issues.  On exam her incision looks great.  I tried to drain a seroma from the left hip area but only got 20 cc of fluid.  There is no evidence of infection.  Her ligaments are equal.  She gets up on exam table easily.  This point she can get her incision wet and will let the Steri-Strips fall off on their own.  Should increase her activities as comfort allows.  We will stop her aspirin as well since she is already very active and has no leg swelling and no calf tenderness.  She can drive from my standpoint.  All question concerns were answered and addressed.  We will see her back in 4 weeks to see how she is doing overall from a mobility standpoint.

## 2019-01-04 ENCOUNTER — Ambulatory Visit (INDEPENDENT_AMBULATORY_CARE_PROVIDER_SITE_OTHER): Payer: Medicare Other | Admitting: Physician Assistant

## 2019-01-04 ENCOUNTER — Encounter (INDEPENDENT_AMBULATORY_CARE_PROVIDER_SITE_OTHER): Payer: Self-pay | Admitting: Physician Assistant

## 2019-01-04 DIAGNOSIS — M1612 Unilateral primary osteoarthritis, left hip: Secondary | ICD-10-CM

## 2019-01-04 NOTE — Progress Notes (Signed)
HPI: Ms. Endress returns today now 20 days status post left total hip arthroplasty.  She is wanting fluid drained on her hip.  She states she has no pain just some discomfort lateral aspect of the hip.  She is otherwise doing well.  Had no fevers chills.  Physical exam: Left hip surgical incisions healing well no signs of infection.  Slight seroma.  No abnormal warmth about the hip.  Good range of motion of the left hip without pain.  Impression: Status post left total hip arthroplasty 10/26/2018  Plan: Left hip is prepped with Betadine and ethyl chloride use Skin and then aspirations performed 25 cc of seroma aspirate is obtained patient tolerates well.  Should follow-up with Dr. Magnus Ivan on 01/25/2019 for her regular scheduled follow-up appointment sooner if there is any questions or concerns.

## 2019-01-16 ENCOUNTER — Encounter (INDEPENDENT_AMBULATORY_CARE_PROVIDER_SITE_OTHER): Payer: Self-pay

## 2019-01-18 ENCOUNTER — Encounter (INDEPENDENT_AMBULATORY_CARE_PROVIDER_SITE_OTHER): Payer: Self-pay | Admitting: Physician Assistant

## 2019-01-18 ENCOUNTER — Ambulatory Visit (INDEPENDENT_AMBULATORY_CARE_PROVIDER_SITE_OTHER): Payer: Medicare Other | Admitting: Physician Assistant

## 2019-01-18 DIAGNOSIS — Z96642 Presence of left artificial hip joint: Secondary | ICD-10-CM

## 2019-01-18 NOTE — Progress Notes (Signed)
HPI: [name] returns today 34 days status post left total hip arthroplasty.  She is wondering if she may have a seroma left hip and wants [place] to attempt an aspiration.  Physical exam left hip surgical incisions well-healed.  No abnormal warmth erythema.  Slight area of fullness over it just posterior to the surgical incision.  Impression: Status post left total hip arthroplasty  Plan: Left hip was prepped with Betadine ethyl chloride using a size skin and then attempted aspiration yields only 5 cc of serosanguineous type fluid.  Patient tolerates well.  She will follow-up at her regular scheduled appointment week.  Continue to work on strengthening left hip.

## 2019-01-25 ENCOUNTER — Encounter (INDEPENDENT_AMBULATORY_CARE_PROVIDER_SITE_OTHER): Payer: Self-pay | Admitting: Orthopaedic Surgery

## 2019-01-25 ENCOUNTER — Ambulatory Visit (INDEPENDENT_AMBULATORY_CARE_PROVIDER_SITE_OTHER): Payer: Medicare Other | Admitting: Orthopaedic Surgery

## 2019-01-25 DIAGNOSIS — Z96642 Presence of left artificial hip joint: Secondary | ICD-10-CM

## 2019-01-25 NOTE — Progress Notes (Signed)
Tanya Cortez is now 6 weeks status post a left total hip arthroplasty.  She is 68 years old and is already been playing golf.  She has known arthritis in her right hip but it is not bothering her at all.  She says her left hip is doing well.  She does have some IT band pain and some numbness and tingling but overall is walking without assistive device and doing well.  On exam her incision looks good.  There is still some mild swelling there but nothing to drain.  She does have some IT band pain but good range of motion of both hips.  At this point we will follow her up in 6 months to see how she is doing overall.  At that visit I like a standing low AP pelvis however lateral both hips at that visit and she does have known arthritic changes in her right hip.  If there is any issues before then she will let us know.

## 2019-02-01 ENCOUNTER — Ambulatory Visit (INDEPENDENT_AMBULATORY_CARE_PROVIDER_SITE_OTHER): Payer: Medicare Other | Admitting: Family Medicine

## 2019-02-01 ENCOUNTER — Encounter: Payer: Self-pay | Admitting: Family Medicine

## 2019-02-01 VITALS — BP 118/78 | HR 80 | Ht 64.0 in | Wt 139.0 lb

## 2019-02-01 DIAGNOSIS — Z96642 Presence of left artificial hip joint: Secondary | ICD-10-CM

## 2019-02-01 DIAGNOSIS — E7800 Pure hypercholesterolemia, unspecified: Secondary | ICD-10-CM | POA: Diagnosis not present

## 2019-02-01 DIAGNOSIS — I10 Essential (primary) hypertension: Secondary | ICD-10-CM

## 2019-02-01 NOTE — Progress Notes (Signed)
Subjective:    CC: BP  HPI:  Hypertension- Pt denies chest pain, SOB, dizziness, or heart palpitations.  Taking meds as directed w/o problems.  She came off of her blood pressure medication after her surgery as her blood pressures were low when physical therapy was coming out.  Has been checking them at home and her systolics have mostly been between 118 and 120 and have looked fantastic.   Had left hip replacement on Jan 20th.  She is doing well. Surgeon did a BMP.  Noted that her calcium was low.  She says she takes calcium 600 mg twice a day and has been doing so consistently.  Hyperlipidemia-tolerating her Lipitor well without any side effects or problems.  She is due for repeat colon cancer screening.  Past medical history, Surgical history, Family history not pertinant except as noted below, Social history, Allergies, and medications have been entered into the medical record, reviewed, and corrections made.   Review of Systems: No fevers, chills, night sweats, weight loss, chest pain, or shortness of breath.   Objective:    General: Well Developed, well nourished, and in no acute distress.  Neuro: Alert and oriented x3, extra-ocular muscles intact, sensation grossly intact.  HEENT: Normocephalic, atraumatic  Skin: Warm and dry, no rashes. Cardiac: Regular rate and rhythm, no murmurs rubs or gallops, no lower extremity edema.  Respiratory: Clear to auscultation bilaterally. Not using accessory muscles, speaking in full sentences.   Impression and Recommendations:    HTN - dong well off medication. Will monitor.  Follow-up in 6 months.  Colon cancer screening -due for repeat colon cancer screening.  She would like to do the Cologuard again.  Hyperlipidemia-tolerating statin well without any side effects or problems.  Status post left hip replacement-she is back to playing golf and doing well.

## 2019-03-21 ENCOUNTER — Other Ambulatory Visit: Payer: Self-pay | Admitting: Family Medicine

## 2019-05-22 ENCOUNTER — Encounter: Payer: Self-pay | Admitting: Family Medicine

## 2019-07-26 ENCOUNTER — Ambulatory Visit: Payer: Self-pay

## 2019-07-26 ENCOUNTER — Encounter: Payer: Self-pay | Admitting: Orthopaedic Surgery

## 2019-07-26 ENCOUNTER — Ambulatory Visit (INDEPENDENT_AMBULATORY_CARE_PROVIDER_SITE_OTHER): Payer: Medicare Other | Admitting: Orthopaedic Surgery

## 2019-07-26 DIAGNOSIS — Z96642 Presence of left artificial hip joint: Secondary | ICD-10-CM | POA: Diagnosis not present

## 2019-07-26 NOTE — Progress Notes (Signed)
The patient is now 7 months status post a left total hip arthroplasty.  She is doing about everything she would like to do.  She is been playing golf and she is very active at 68 years old.  She does feel like the hip is still stiff and would like to get some of the stiffness worked out.  However her right hip is been stiff as well.  She ambulates without an assistive device.  She seems to be doing well overall when we see her walk.  On exam both hips move smoothly and fluidly with just some stiffness but not severe.  Her leg lengths appear equal.  She has good strength in her hips and her knees.  An AP pelvis and lateral both hips is reviewed today.  Her left total hip arthroplasty is in good position with no complicating features.  The right hip does show severe osteoarthritis and degenerative joint disease with significant joint space narrowing as well as para-articular osteophytes and sclerotic changes.  At this point follow-up will be as needed.  She says her right hip is not bothering her enough to do anything about at all at this point but she would let us know if it becomes problematic and painful based on her x-ray findings.  She does have some stiffness in the right hip but it is not to the point that it is causing enough pain to definitely affect her mobility and her quality of life as well as her actives daily living.  All question concerns were answered and addressed.  If she has any issues at all she will let us know.

## 2019-07-31 ENCOUNTER — Ambulatory Visit: Payer: Medicare Other | Admitting: Family Medicine

## 2019-08-01 ENCOUNTER — Telehealth: Payer: Self-pay | Admitting: Orthopaedic Surgery

## 2019-08-01 NOTE — Telephone Encounter (Signed)
Patient want to schedule surgery. Thank you

## 2019-08-01 NOTE — Telephone Encounter (Signed)
Wants to go ahead with surgery

## 2019-08-02 ENCOUNTER — Telehealth: Payer: Self-pay | Admitting: Orthopaedic Surgery

## 2019-08-02 NOTE — Telephone Encounter (Signed)
Patient called stating that she has an appointment at [facility] for a cleaning.  She is needing a letter from [place] stating that she does not need to take the pre-meds before her appointment.  She would like for you to fax that over to them at [phone].  She would also like for you to send a message on MyChart letting her know if was done.  CB#[phone].  Thank you.

## 2019-08-02 NOTE — Telephone Encounter (Signed)
Faxed to provided number  

## 2019-08-04 NOTE — Telephone Encounter (Signed)
She needs a follow-up appoint soon first since at her last appointment, she stated that her other hip was not bothering her much.  I agree that on x-ray, she needs that hip replaced.  We will need to see her first to document thing better.

## 2019-08-06 ENCOUNTER — Telehealth: Payer: Self-pay | Admitting: Orthopaedic Surgery

## 2019-08-06 NOTE — Telephone Encounter (Signed)
Called patient left message on voicemail to return call to schedule an appointment for her hip with Dr Ninfa Linden

## 2019-08-06 NOTE — Telephone Encounter (Signed)
Can we get her in to see [place], just next available. Doesn't need to be a work in

## 2019-08-08 ENCOUNTER — Encounter: Payer: Self-pay | Admitting: Family Medicine

## 2019-08-08 ENCOUNTER — Other Ambulatory Visit: Payer: Self-pay | Admitting: Family Medicine

## 2019-08-08 ENCOUNTER — Ambulatory Visit (INDEPENDENT_AMBULATORY_CARE_PROVIDER_SITE_OTHER): Payer: Medicare Other | Admitting: Family Medicine

## 2019-08-08 ENCOUNTER — Other Ambulatory Visit: Payer: Self-pay

## 2019-08-08 VITALS — BP 126/65 | HR 81 | Temp 98.6°F | Ht 64.0 in | Wt 138.0 lb

## 2019-08-08 DIAGNOSIS — Z23 Encounter for immunization: Secondary | ICD-10-CM

## 2019-08-08 DIAGNOSIS — Z1211 Encounter for screening for malignant neoplasm of colon: Secondary | ICD-10-CM

## 2019-08-08 DIAGNOSIS — I10 Essential (primary) hypertension: Secondary | ICD-10-CM

## 2019-08-08 DIAGNOSIS — M8580 Other specified disorders of bone density and structure, unspecified site: Secondary | ICD-10-CM | POA: Diagnosis not present

## 2019-08-08 DIAGNOSIS — E7800 Pure hypercholesterolemia, unspecified: Secondary | ICD-10-CM | POA: Diagnosis not present

## 2019-08-08 DIAGNOSIS — Z1231 Encounter for screening mammogram for malignant neoplasm of breast: Secondary | ICD-10-CM

## 2019-08-08 NOTE — Progress Notes (Signed)
Established Patient Office Visit  Subjective:  Patient ID: Tanya Cortez, female    DOB: 1951/07/05  Age: 68 y.o. MRN: [id number]  CC:  Chief Complaint  Patient presents with  . Hypertension    HPI Tanya Cortez presents for   Hypertension- Pt denies chest pain, SOB, dizziness, or heart palpitations.  Taking meds as directed w/o problems.  Denies medication side effects.  She is active and plays golf 4 times a week.  Osteoporosis-she is been doing weightbearing exercise taking her calcium as well as her alendronate.  There is a program where you go once a week called [facility] in [place] and wanted to know if I had heard of it and if I would recommend it.  She is actually planning on probably having her right hip replaced later this year she is Artie had a left done with Dr. Jean Rosenthal.  She is due for her colon cancer screening and would like to do Cologuard again.   Past Medical History:  Diagnosis Date  . Degenerative joint disease (DJD) of hip 06/13/2018  . Essential hypertension, benign 04/22/2014  . Hyperlipidemia 04/22/2014  . Osteopenia determined by x-ray 04/20/2018   T score -2.1 on Dexa scan 2017    Past Surgical History:  Procedure Laterality Date  . TONSILLECTOMY    . TOTAL HIP ARTHROPLASTY Left 12/15/2018   Procedure: LEFT TOTAL HIP ARTHROPLASTY ANTERIOR APPROACH;  Surgeon: Mcarthur Rossetti, MD;  Location: [facility];  Service: Orthopedics;  Laterality: Left;    Family History  Problem Relation Age of Onset  . Skin cancer Mother        squamous cell   . Hypertension Mother   . Breast cancer Mother   . COPD Mother   . Heart attack Maternal Grandfather   . Stroke Maternal Grandfather   . Heart attack Father        died 52 yo  . Hypertension Sister     Social History   Socioeconomic History  . Marital status: Single    Spouse name: Not on file  . Number of children: Not on file  . Years of education: Not on file  .  Highest education level: Not on file  Occupational History  . Occupation: high school principal    Comment: [employer]  Social Needs  . Financial resource strain: Not on file  . Food insecurity    Worry: Not on file    Inability: Not on file  . Transportation needs    Medical: Not on file    Non-medical: Not on file  Tobacco Use  . Smoking status: Never Smoker  . Smokeless tobacco: Never Used  Substance and Sexual Activity  . Alcohol use: No  . Drug use: No  . Sexual activity: Never  Lifestyle  . Physical activity    Days per week: Not on file    Minutes per session: Not on file  . Stress: Not on file  Relationships  . Social Herbalist on phone: Not on file    Gets together: Not on file    Attends religious service: Not on file    Active member of club or organization: Not on file    Attends meetings of clubs or organizations: Not on file    Relationship status: Not on file  . Intimate partner violence    Fear of current or ex partner: Not on file    Emotionally abused: Not on file    Physically  abused: Not on file    Forced sexual activity: Not on file  Other Topics Concern  . Not on file  Social History Narrative   Patient reports exercising regularly by doing 30 -45 mins daily of cardio. 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  She has a degree in education specialty.  Lives with Tanya Cortez.  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 alendronate (FOSAMAX) 70 MG tablet Take 1 tablet (70 mg total) by mouth every 7 (seven) days. Take with a full glass of water on an empty stomach. 12 tablet 3  . atorvastatin (LIPITOR) 40 MG tablet Take 1 tablet by mouth once daily 90 tablet 3  . Calcium Carbonate-Vit D-Min (CALTRATE 600+D PLUS MINERALS) 600-800 MG-UNIT TABS Take 1 tablet by mouth 2 (two) times daily.     No facility-administered medications prior to visit.      No Known Allergies  ROS Review of Systems    Objective:    Physical Exam  Constitutional: She is oriented to person, place, and time. She appears well-developed and well-nourished.  HENT:  Head: Normocephalic and atraumatic.  Cardiovascular: Normal rate, regular rhythm and normal heart sounds.  Pulmonary/Chest: Effort normal and breath sounds normal.  Neurological: She is alert and oriented to person, place, and time.  Skin: Skin is warm and dry.  Psychiatric: She has a normal mood and affect. Her behavior is normal.    BP 126/65   Pulse 81   Temp 98.6 F (37 C)   Ht 5\' 4"  (1.626 m)   Wt 138 lb (62.6 kg)   BMI 23.69 kg/m  Wt Readings from Last 3 Encounters:  08/08/19 138 lb (62.6 kg)  02/01/19 139 lb (63 kg)  12/15/18 137 lb (62.1 kg)     Health Maintenance Due  Topic Date Due  . Fecal DNA (Cologuard)  01/04/2019  . INFLUENZA VACCINE  07/28/2019    There are no preventive care reminders to display for this patient.  Lab Results  Component Value Date   TSH 2.300 04/25/2014   Lab Results  Component Value Date   WBC 10.6 (H) 12/16/2018   HGB 10.6 (L) 12/16/2018   HCT 33.2 (L) 12/16/2018   MCV 96.5 12/16/2018   PLT 158 12/16/2018   Lab Results  Component Value Date   NA 140 12/16/2018   K 4.1 12/16/2018   CO2 23 12/16/2018   GLUCOSE 115 (H) 12/16/2018   BUN 13 12/16/2018   CREATININE 0.62 12/16/2018   BILITOT 0.6 08/01/2018   ALKPHOS 93 08/02/2017   AST 18 08/01/2018   ALT 10 08/01/2018   PROT 6.2 08/01/2018   ALBUMIN 4.5 08/02/2017   CALCIUM 8.6 (L) 12/16/2018   ANIONGAP 13 12/16/2018   Lab Results  Component Value Date   CHOL 168 08/01/2018   Lab Results  Component Value Date   HDL 71 08/01/2018   Lab Results  Component Value Date   LDLCALC 83 08/01/2018   Lab Results  Component Value Date   TRIG 48 08/01/2018   Lab Results  Component Value Date   CHOLHDL 2.4 08/01/2018   Lab Results  Component Value Date   HGBA1C 5.5  05/08/2014      Assessment & Plan:   Problem List Items Addressed This Visit      Cardiovascular and Mediastinum   Essential hypertension, benign - Primary    Well controlled.  Continue current regimen. Follow up in  6 months       Relevant Orders   COMPLETE METABOLIC PANEL WITH GFR   Lipid panel     Other   Osteopenia determined by x-ray    Continue current regimen with alendronate, calcium and weightbearing exercise.  I will check into the [facility] program.      Hyperlipidemia    Due to recheck lipid panel.  Doing well on her statin.       Other Visit Diagnoses    Screening for malignant neoplasm of colon       Relevant Orders   Cologuard      Flu shot given.    No orders of the defined types were placed in this encounter.   Follow-up: Return in about 6 months (around 02/08/2020) for Hypertension.    Nani Gasseratherine Emily Forse, MD

## 2019-08-08 NOTE — Assessment & Plan Note (Signed)
Due to recheck lipid panel.  Doing well on her statin.

## 2019-08-08 NOTE — Assessment & Plan Note (Signed)
Well controlled. Continue current regimen. Follow up in  6 months.  

## 2019-08-08 NOTE — Assessment & Plan Note (Signed)
Continue current regimen with alendronate, calcium and weightbearing exercise.  I will check into the [facility] program.

## 2019-08-09 LAB — LIPID PANEL
Cholesterol: 187 mg/dL (ref <200)
HDL: 74 mg/dL (ref >=50)
LDL Cholesterol (Calc): 99 mg/dL (calc)
Non-HDL Cholesterol (Calc): 113 mg/dL (calc) (ref <130)
Total CHOL/HDL Ratio: 2.5 (calc) (ref <5.0)
Triglycerides: 54 mg/dL (ref <150)

## 2019-08-09 LAB — COMPLETE METABOLIC PANEL WITH GFR
AG Ratio: 2 (calc) (ref 1.0–2.5)
ALT: 9 U/L (ref 6–29)
AST: 19 U/L (ref 10–35)
Albumin: 4.6 g/dL (ref 3.6–5.1)
Alkaline phosphatase (APISO): 76 U/L (ref 37–153)
BUN: 18 mg/dL (ref 7–25)
CO2: 31 mmol/L (ref 20–32)
Calcium: 10 mg/dL (ref 8.6–10.4)
Chloride: 105 mmol/L (ref 98–110)
Creat: 0.77 mg/dL (ref 0.50–0.99)
GFR, Est African American: 91 mL/min/{1.73_m2} (ref >=60)
GFR, Est Non African American: 79 mL/min/{1.73_m2} (ref >=60)
Globulin: 2.3 g/dL (calc) (ref 1.9–3.7)
Glucose, Bld: 101 mg/dL — ABNORMAL HIGH (ref 65–99)
Potassium: 4.5 mmol/L (ref 3.5–5.3)
Sodium: 143 mmol/L (ref 135–146)
Total Bilirubin: 0.5 mg/dL (ref 0.2–1.2)
Total Protein: 6.9 g/dL (ref 6.1–8.1)

## 2019-08-14 ENCOUNTER — Ambulatory Visit: Payer: Medicare Other | Admitting: Family Medicine

## 2019-08-14 DIAGNOSIS — Z23 Encounter for immunization: Secondary | ICD-10-CM | POA: Diagnosis not present

## 2019-08-21 ENCOUNTER — Ambulatory Visit (INDEPENDENT_AMBULATORY_CARE_PROVIDER_SITE_OTHER): Payer: Medicare Other | Admitting: Orthopaedic Surgery

## 2019-08-21 DIAGNOSIS — M1611 Unilateral primary osteoarthritis, right hip: Secondary | ICD-10-CM | POA: Insufficient documentation

## 2019-08-21 NOTE — Progress Notes (Signed)
Office Visit Note   Patient: Tanya Cortez           Date of Birth: [birth year]           MRN: [id number] Visit Date: 08/21/2019              Requested by: Hali Marry, [employer] [employer] [place],  [place] [id number] PCP: Hali Marry, MD   Assessment & Plan: Visit Diagnoses:  1. Unilateral primary osteoarthritis, right hip     Plan: I agree with getting her scheduled for a right total hip arthroplasty based on the severity of her arthritis in her right hip and what she is experiencing clinically.  Also her physical exam she has severe stiffness and pain in that hip.  Having had the surgery before on her left side 8 months ago she is fully aware of the risk and benefits of surgery.  She understands her interoperative and postoperative course.  We will work on getting this scheduled.  We would then see her back in 2 weeks postoperative.  All question concerns were answered and addressed.  Follow-Up Instructions: Return for 2 weeks post-op.   Orders:  No orders of the defined types were placed in this encounter.  No orders of the defined types were placed in this encounter.     Procedures: No procedures performed   Clinical Data: No additional findings.   Subjective: Chief Complaint  Patient presents with  . Right Hip - Pain  The patient comes in today with severe debilitating arthritis involving her right hip.  This is been well-documented for over a year now.  She actually has a history of a left total hip arthroplasty that we did 8 months ago through a direct anterior approach.  That hip is done very well for her.  Her right hip is been stiff the whole time.  She has pain in her groin.  At this point her right hip pain now has detrimentally affecting her mobility, her quality of life, and her actives daily living to the point that she does wish to go ahead and proceed with a right total hip arthroplasty.  She is a very active 68 year old  female.  She is very pleased with the outcome of her left hip.  She is already tried and failed all forms conservative treatment on the right hip including a home exercise program and physical therapy, anti-inflammatories, rest, activity modification, and injections.  HPI  Review of Systems She currently denies any headache, chest pain, shortness of breath, fever, chills, nausea, vomiting  Objective: Vital Signs: There were no vitals taken for this visit.  Physical Exam She is alert and orient x3 and in no acute distress Ortho Exam Examination of her right hip shows significant stiffness and pain with attempts of internal and external rotation.  Her left hip which is the replaced hip moves smoothly. Specialty Comments:  No specialty comments available.  Imaging: No results found. X-rays from her last visit shows a well-seated total hip arthroplasty on her left hip with severe end-stage arthritis of the right hip.  There is significant joint space narrowing.  There are sclerotic changes in the femoral head and the acetabulum.  There are large periarticular osteophytes and some flattening of the femoral head as well.  PMFS History: Patient Active Problem List   Diagnosis Date Noted  . Unilateral primary osteoarthritis, right hip 08/21/2019  . Unilateral primary osteoarthritis, left hip 12/15/2018  . Status  post total replacement of left hip 12/15/2018  . Degenerative joint disease (DJD) of hip 06/13/2018  . Osteopenia determined by x-ray 04/20/2018  . Tendonitis involving hip abductors 04/20/2018  . Heart murmur 05/24/2014  . Hyperlipidemia 04/22/2014  . Essential hypertension, benign 04/22/2014   Past Medical History:  Diagnosis Date  . Degenerative joint disease (DJD) of hip 06/13/2018  . Essential hypertension, benign 04/22/2014  . Hyperlipidemia 04/22/2014  . Osteopenia determined by x-ray 04/20/2018   T score -2.1 on Dexa scan 2017    Family History  Problem Relation Age of  Onset  . Skin cancer Mother        squamous cell   . Hypertension Mother   . Breast cancer Mother   . COPD Mother   . Heart attack Maternal Grandfather   . Stroke Maternal Grandfather   . Heart attack Father        died 69 yo  . Hypertension Sister     Past Surgical History:  Procedure Laterality Date  . TONSILLECTOMY    . TOTAL HIP ARTHROPLASTY Left 12/15/2018   Procedure: LEFT TOTAL HIP ARTHROPLASTY ANTERIOR APPROACH;  Surgeon: Kathryne HitchBlackman, Tysha Grismore Y, MD;  Location: [facility];  Service: Orthopedics;  Laterality: Left;   Social History   Occupational History  . Occupation: high school principal    Comment: [employer]  Tobacco Use  . Smoking status: Never Smoker  . Smokeless tobacco: Never Used  Substance and Sexual Activity  . Alcohol use: No  . Drug use: No  . Sexual activity: Never

## 2019-08-27 LAB — COLOGUARD: Cologuard: NEGATIVE

## 2019-08-30 ENCOUNTER — Telehealth: Payer: Self-pay | Admitting: Family Medicine

## 2019-08-30 NOTE — Telephone Encounter (Signed)
Call pt: cologuard result is negative.  Repeat colon cancer screening in 3 years.

## 2019-08-31 ENCOUNTER — Other Ambulatory Visit: Payer: Self-pay | Admitting: Family Medicine

## 2019-08-31 NOTE — Telephone Encounter (Signed)
Pt advised..Tanya Cortez, CMA  

## 2019-09-05 ENCOUNTER — Ambulatory Visit: Payer: Medicare Other | Admitting: Orthopaedic Surgery

## 2019-09-06 ENCOUNTER — Other Ambulatory Visit: Payer: Self-pay

## 2019-09-10 ENCOUNTER — Other Ambulatory Visit: Payer: Self-pay | Admitting: Physician Assistant

## 2019-09-14 ENCOUNTER — Encounter: Payer: Self-pay | Admitting: Family Medicine

## 2019-09-17 NOTE — Progress Notes (Signed)
PCP - Beatrice Lecher Cardiologist -   Chest x-ray -  EKG - 09-18-19 Stress Test -  ECHO -  Cardiac Cath -   Sleep Study -  CPAP -   Fasting Blood Sugar -  Checks Blood Sugar _____ times a day  Blood Thinner Instructions: Aspirin Instructions: Last Dose:  Anesthesia review:   Patient denies shortness of breath, fever, cough and chest pain at PAT appointment   Patient verbalized understanding of instructions that were given to them at the PAT appointment. Patient was also instructed that they will need to review over the PAT instructions again at home before surgery.

## 2019-09-17 NOTE — Patient Instructions (Addendum)
DUE TO COVID-19 ONLY ONE VISITOR IS ALLOWED TO COME WITH YOU AND STAY IN THE WAITING ROOM ONLY DURING PRE OP AND PROCEDURE DAY OF SURGERY. THE 1 VISITOR MAY VISIT WITH YOU AFTER SURGERY IN YOUR PRIVATE ROOM DURING VISITING HOURS ONLY!  YOU NEED TO HAVE A COVID 19 TEST ON 09-18-19 @ 10:20 AM, THIS TEST MUST BE DONE BEFORE SURGERY, COME  [place], [place] , [address].  ([place]) ONCE YOUR COVID TEST IS COMPLETED, PLEASE BEGIN THE QUARANTINE INSTRUCTIONS AS OUTLINED IN YOUR HANDOUT.                Tanya Cortez  09/17/2019   Your procedure is scheduled on: 09-21-19   Report to [facility] Main  Entrance    Report to Admitting at 5:30 AM     Call this number if you have problems the morning of surgery [phone]    Remember: NO SOLID FOOD AFTER MIDNIGHT THE NIGHT PRIOR TO SURGERY. NOTHING BY MOUTH EXCEPT CLEAR LIQUIDS UNTIL 4:15  . PLEASE FINISH ENSURE DRINK PER SURGEON ORDER  WHICH NEEDS TO BE COMPLETED AT 4:15 AM .     CLEAR LIQUID DIET   Foods Allowed                                                                     Foods Excluded  Coffee and tea, regular and decaf                             liquids that you cannot  Plain Jell-O any favor except red or purple                                           see through such as: Fruit ices (not with fruit pulp)                                     milk, soups, orange juice  Iced Popsicles                                    All solid food Carbonated beverages, regular and diet                                    Cranberry, grape and apple juices Sports drinks like Gatorade Lightly seasoned clear broth or consume(fat free) Sugar, honey syrup  _____________________________________________________________________     Take these medicines the morning of surgery with A SIP OF WATER: None  BRUSH YOUR TEETH MORNING OF SURGERY AND RINSE YOUR MOUTH OUT, NO CHEWING GUM CANDY OR MINTS.                      You may not have any metal on your body including hair pins and  piercings    Do not wear jewelry, make-up, lotions, powders or perfumes, deodorant              Do not wear nail polish.  Do not shave  48 hours prior to surgery.                Do not bring valuables to the hospital. [facility] IS NOT             RESPONSIBLE   FOR VALUABLES.  Contacts, dentures or bridgework may not be worn into surgery.  Leave suitcase in the car. After surgery it may be brought to your room.       Special Instructions: N/A              Please read over the following fact sheets you were given: _____________________________________________________________________             [facility] Before surgery, you can play an important role.  Because skin is not sterile, your skin needs to be as free of germs as possible.  You can reduce the number of germs on your skin by washing with CHG (chlorahexidine gluconate) soap before surgery.  CHG is an antiseptic cleaner which kills germs and bonds with the skin to continue killing germs even after washing. Please DO NOT use if you have an allergy to CHG or antibacterial soaps.  If your skin becomes reddened/irritated stop using the CHG and inform your nurse when you arrive at Short Stay. Do not shave (including legs and underarms) for at least 48 hours prior to the first CHG shower.  You may shave your face/neck. Please follow these instructions carefully:  1.  Shower with CHG Soap the night before surgery and the  morning of Surgery.  2.  If you choose to wash your hair, wash your hair first as usual with your  normal  shampoo.  3.  After you shampoo, rinse your hair and body thoroughly to remove the  shampoo.                           4.  Use CHG as you would any other liquid soap.  You can apply chg directly  to the skin and wash                       Gently with a scrungie or clean washcloth.  5.  Apply  the CHG Soap to your body ONLY FROM THE NECK DOWN.   Do not use on face/ open                           Wound or open sores. Avoid contact with eyes, ears mouth and genitals (private parts).                       Wash face,  Genitals (private parts) with your normal soap.             6.  Wash thoroughly, paying special attention to the area where your surgery  will be performed.  7.  Thoroughly rinse your body with warm water from the neck down.  8.  DO NOT shower/wash with your normal soap after using and rinsing off  the CHG Soap.                9.  Pat yourself dry with a clean towel.            10.  Wear clean pajamas.            11.  Place clean sheets on your bed the night of your first shower and do not  sleep with pets. Day of Surgery : Do not apply any lotions/deodorants the morning of surgery.  Please wear clean clothes to the hospital/surgery center.  FAILURE TO FOLLOW THESE INSTRUCTIONS MAY RESULT IN THE CANCELLATION OF YOUR SURGERY PATIENT SIGNATURE_________________________________  NURSE SIGNATURE__________________________________  ________________________________________________________________________

## 2019-09-18 ENCOUNTER — Other Ambulatory Visit: Payer: Self-pay

## 2019-09-18 ENCOUNTER — Encounter (HOSPITAL_COMMUNITY)
Admission: RE | Admit: 2019-09-18 | Discharge: 2019-09-18 | Disposition: A | Discharge status: Home / Self Care | Payer: Medicare Other | Source: Ambulatory Visit | Attending: Orthopaedic Surgery | Admitting: Orthopaedic Surgery

## 2019-09-18 ENCOUNTER — Encounter (HOSPITAL_COMMUNITY): Payer: Self-pay

## 2019-09-18 ENCOUNTER — Other Ambulatory Visit (HOSPITAL_COMMUNITY)
Admission: RE | Admit: 2019-09-18 | Discharge: 2019-09-18 | Disposition: A | Discharge status: Home / Self Care | Payer: Medicare Other | Source: Ambulatory Visit | Attending: Orthopaedic Surgery | Admitting: Orthopaedic Surgery

## 2019-09-18 DIAGNOSIS — M1611 Unilateral primary osteoarthritis, right hip: Secondary | ICD-10-CM | POA: Diagnosis not present

## 2019-09-18 DIAGNOSIS — Z01818 Encounter for other preprocedural examination: Secondary | ICD-10-CM | POA: Diagnosis present

## 2019-09-18 DIAGNOSIS — I10 Essential (primary) hypertension: Secondary | ICD-10-CM | POA: Insufficient documentation

## 2019-09-18 DIAGNOSIS — Z20828 Contact with and (suspected) exposure to other viral communicable diseases: Secondary | ICD-10-CM | POA: Diagnosis not present

## 2019-09-18 LAB — BASIC METABOLIC PANEL
Anion gap: 8 (ref 5–15)
BUN: 14 mg/dL (ref 8–23)
CO2: 29 mmol/L (ref 22–32)
Calcium: 9.6 mg/dL (ref 8.9–10.3)
Chloride: 104 mmol/L (ref 98–111)
Creatinine, Ser: 0.75 mg/dL (ref 0.44–1.00)
GFR calc Af Amer: >60 mL/min (ref >60)
GFR calc non Af Amer: >60 mL/min (ref >60)
Glucose, Bld: 95 mg/dL (ref 70–99)
Potassium: 4.2 mmol/L (ref 3.5–5.1)
Sodium: 141 mmol/L (ref 135–145)

## 2019-09-18 LAB — CBC
HCT: 43.6 % (ref 36.0–46.0)
Hemoglobin: 14 g/dL (ref 12.0–15.0)
MCH: 30.7 pg (ref 26.0–34.0)
MCHC: 32.1 g/dL (ref 30.0–36.0)
MCV: 95.6 fL (ref 80.0–100.0)
Platelets: 234 10*3/uL (ref 150–400)
RBC: 4.56 MIL/uL (ref 3.87–5.11)
RDW: 12 % (ref 11.5–15.5)
WBC: 5.6 10*3/uL (ref 4.0–10.5)
nRBC: 0 % (ref 0.0–0.2)

## 2019-09-18 LAB — SURGICAL PCR SCREEN
MRSA, PCR: NEGATIVE
Staphylococcus aureus: NEGATIVE

## 2019-09-19 LAB — NOVEL CORONAVIRUS, NAA (HOSP ORDER, SEND-OUT TO REF LAB; TAT 18-24 HRS): SARS-CoV-2, NAA: NOT DETECTED

## 2019-09-20 NOTE — Anesthesia Preprocedure Evaluation (Addendum)
Anesthesia Evaluation  Patient identified by MRN, date of birth, ID band Patient awake    Reviewed: Allergy & Precautions, NPO status , Patient's Chart, lab work & pertinent test results  History of Anesthesia Complications Negative for: history of anesthetic complications  Airway Mallampati: II  TM Distance: >3 FB Neck ROM: Full    Dental  (+) Teeth Intact   Pulmonary neg pulmonary ROS,    Pulmonary exam normal        Cardiovascular hypertension, Normal cardiovascular exam     Neuro/Psych negative neurological ROS  negative psych ROS   GI/Hepatic negative GI ROS, Neg liver ROS,   Endo/Other  negative endocrine ROS  Renal/GU negative Renal ROS  negative genitourinary   Musculoskeletal  (+) Arthritis , Osteoarthritis,    Abdominal   Peds  Hematology negative hematology ROS (+)   Anesthesia Other Findings plts 234, no AC  Normal echo 2015  Reproductive/Obstetrics                            Anesthesia Physical Anesthesia Plan  ASA: II  Anesthesia Plan: Spinal   Post-op Pain Management:    Induction:   PONV Risk Score and Plan: Propofol infusion, Treatment may vary due to age or medical condition, Ondansetron and TIVA  Airway Management Planned: Nasal Cannula and Simple Face Mask  Additional Equipment: None  Intra-op Plan:   Post-operative Plan:   Informed Consent: I have reviewed the patients History and Physical, chart, labs and discussed the procedure including the risks, benefits and alternatives for the proposed anesthesia with the patient or authorized representative who has indicated his/her understanding and acceptance.       Plan Discussed with:   Anesthesia Plan Comments:        Anesthesia Quick Evaluation

## 2019-09-21 ENCOUNTER — Ambulatory Visit (HOSPITAL_COMMUNITY): Payer: Medicare Other

## 2019-09-21 ENCOUNTER — Other Ambulatory Visit: Payer: Self-pay

## 2019-09-21 ENCOUNTER — Encounter (HOSPITAL_COMMUNITY): Payer: Self-pay | Admitting: *Deleted

## 2019-09-21 ENCOUNTER — Ambulatory Visit (HOSPITAL_COMMUNITY): Payer: Medicare Other | Admitting: Physician Assistant

## 2019-09-21 ENCOUNTER — Observation Stay (HOSPITAL_COMMUNITY): Payer: Medicare Other

## 2019-09-21 ENCOUNTER — Observation Stay (HOSPITAL_COMMUNITY)
Admission: RE | Admit: 2019-09-21 | Discharge: 2019-09-22 | Disposition: A | Discharge status: Home / Self Care | Payer: Medicare Other | Source: Ambulatory Visit | Attending: Orthopaedic Surgery | Admitting: Orthopaedic Surgery

## 2019-09-21 ENCOUNTER — Encounter (HOSPITAL_COMMUNITY)
Admission: RE | Disposition: A | Discharge status: Home / Self Care | Payer: Self-pay | Source: Ambulatory Visit | Attending: Orthopaedic Surgery

## 2019-09-21 ENCOUNTER — Ambulatory Visit (HOSPITAL_COMMUNITY): Payer: Medicare Other | Admitting: Certified Registered Nurse Anesthetist

## 2019-09-21 DIAGNOSIS — I10 Essential (primary) hypertension: Secondary | ICD-10-CM | POA: Insufficient documentation

## 2019-09-21 DIAGNOSIS — Z96642 Presence of left artificial hip joint: Secondary | ICD-10-CM | POA: Diagnosis not present

## 2019-09-21 DIAGNOSIS — M25551 Pain in right hip: Secondary | ICD-10-CM | POA: Diagnosis present

## 2019-09-21 DIAGNOSIS — M1611 Unilateral primary osteoarthritis, right hip: Secondary | ICD-10-CM | POA: Diagnosis not present

## 2019-09-21 DIAGNOSIS — Z96641 Presence of right artificial hip joint: Secondary | ICD-10-CM

## 2019-09-21 DIAGNOSIS — Z419 Encounter for procedure for purposes other than remedying health state, unspecified: Secondary | ICD-10-CM

## 2019-09-21 HISTORY — PX: TOTAL HIP ARTHROPLASTY: SHX124

## 2019-09-21 SURGERY — ARTHROPLASTY, HIP, TOTAL, ANTERIOR APPROACH
Anesthesia: Spinal | Site: Hip | Laterality: Right

## 2019-09-21 MED ORDER — ONDANSETRON HCL 4 MG/2ML IJ SOLN
INTRAMUSCULAR | Status: DC | PRN
  Administered 2019-09-21: 4 mg via INTRAVENOUS

## 2019-09-21 MED ORDER — PROPOFOL 500 MG/50ML IV EMUL
INTRAVENOUS | Status: DC | PRN
  Administered 2019-09-21: 50 ug/kg/min via INTRAVENOUS

## 2019-09-21 MED ORDER — EPHEDRINE 5 MG/ML INJ
INTRAVENOUS | Status: AC
  Filled 2019-09-21: qty 10

## 2019-09-21 MED ORDER — MIDAZOLAM HCL 5 MG/5ML IJ SOLN
INTRAMUSCULAR | Status: DC | PRN
  Administered 2019-09-21: 1 mg via INTRAVENOUS

## 2019-09-21 MED ORDER — FENTANYL CITRATE (PF) 100 MCG/2ML IJ SOLN
INTRAMUSCULAR | Status: DC | PRN
  Administered 2019-09-21: 50 ug via INTRAVENOUS

## 2019-09-21 MED ORDER — DIPHENHYDRAMINE HCL 12.5 MG/5ML PO ELIX: 12.5000 mg | ORAL_SOLUTION | ORAL | Status: DC | PRN

## 2019-09-21 MED ORDER — POVIDONE-IODINE 10 % EX SWAB
2.0000 "application " | Freq: Once | CUTANEOUS | Status: AC
  Administered 2019-09-21: 2 via TOPICAL

## 2019-09-21 MED ORDER — MENTHOL 3 MG MT LOZG: 1.0000 | LOZENGE | OROMUCOSAL | Status: DC | PRN

## 2019-09-21 MED ORDER — HYDROMORPHONE HCL 1 MG/ML IJ SOLN: 0.5000 mg | INTRAMUSCULAR | Status: DC | PRN

## 2019-09-21 MED ORDER — ZOLPIDEM TARTRATE 5 MG PO TABS: 5.0000 mg | ORAL_TABLET | Freq: Every evening | ORAL | Status: DC | PRN

## 2019-09-21 MED ORDER — ONDANSETRON HCL 4 MG/2ML IJ SOLN: 4.0000 mg | Freq: Once | INTRAMUSCULAR | Status: DC | PRN

## 2019-09-21 MED ORDER — BUPIVACAINE IN DEXTROSE 0.75-8.25 % IT SOLN
INTRATHECAL | Status: DC | PRN
  Administered 2019-09-21: 1.8 mL via INTRATHECAL

## 2019-09-21 MED ORDER — CEFAZOLIN SODIUM-DEXTROSE 1-4 GM/50ML-% IV SOLN
1.0000 g | Freq: Four times a day (QID) | INTRAVENOUS | Status: AC
  Administered 2019-09-21 (×2): 1 g via INTRAVENOUS
  Filled 2019-09-21 (×2): qty 50

## 2019-09-21 MED ORDER — ONDANSETRON HCL 4 MG/2ML IJ SOLN
INTRAMUSCULAR | Status: AC
  Filled 2019-09-21: qty 2

## 2019-09-21 MED ORDER — TRANEXAMIC ACID-NACL 1000-0.7 MG/100ML-% IV SOLN
1000.0000 mg | INTRAVENOUS | Status: AC
  Administered 2019-09-21: 1000 mg via INTRAVENOUS

## 2019-09-21 MED ORDER — CHLORHEXIDINE GLUCONATE 4 % EX LIQD: 60.0000 mL | Freq: Once | CUTANEOUS | Status: DC

## 2019-09-21 MED ORDER — TRANEXAMIC ACID-NACL 1000-0.7 MG/100ML-% IV SOLN
INTRAVENOUS | Status: AC
  Filled 2019-09-21: qty 100

## 2019-09-21 MED ORDER — OXYCODONE HCL 5 MG PO TABS
5.0000 mg | ORAL_TABLET | ORAL | Status: DC | PRN
  Administered 2019-09-21 – 2019-09-22 (×3): 10 mg via ORAL
  Filled 2019-09-21 (×3): qty 2

## 2019-09-21 MED ORDER — ACETAMINOPHEN 325 MG PO TABS: 325.0000 mg | ORAL_TABLET | Freq: Four times a day (QID) | ORAL | Status: DC | PRN

## 2019-09-21 MED ORDER — METHOCARBAMOL 500 MG PO TABS
500.0000 mg | ORAL_TABLET | Freq: Four times a day (QID) | ORAL | Status: DC | PRN
  Administered 2019-09-21 – 2019-09-22 (×2): 500 mg via ORAL
  Filled 2019-09-21 (×2): qty 1

## 2019-09-21 MED ORDER — MIDAZOLAM HCL 2 MG/2ML IJ SOLN
INTRAMUSCULAR | Status: AC
  Filled 2019-09-21: qty 2

## 2019-09-21 MED ORDER — PHENOL 1.4 % MT LIQD: 1.0000 | OROMUCOSAL | Status: DC | PRN

## 2019-09-21 MED ORDER — ALUM & MAG HYDROXIDE-SIMETH 200-200-20 MG/5ML PO SUSP: 30.0000 mL | ORAL | Status: DC | PRN

## 2019-09-21 MED ORDER — SODIUM CHLORIDE 0.9 % IR SOLN
Status: DC | PRN
  Administered 2019-09-21: 1000 mL

## 2019-09-21 MED ORDER — PROPOFOL 10 MG/ML IV BOLUS
INTRAVENOUS | Status: AC
  Filled 2019-09-21: qty 60

## 2019-09-21 MED ORDER — CEFAZOLIN SODIUM-DEXTROSE 2-4 GM/100ML-% IV SOLN
INTRAVENOUS | Status: AC
  Filled 2019-09-21: qty 100

## 2019-09-21 MED ORDER — EPHEDRINE SULFATE-NACL 50-0.9 MG/10ML-% IV SOSY
PREFILLED_SYRINGE | INTRAVENOUS | Status: DC | PRN
  Administered 2019-09-21 (×2): 5 mg via INTRAVENOUS

## 2019-09-21 MED ORDER — ASPIRIN 81 MG PO CHEW
81.0000 mg | CHEWABLE_TABLET | Freq: Two times a day (BID) | ORAL | Status: DC
  Administered 2019-09-21 – 2019-09-22 (×2): 81 mg via ORAL
  Filled 2019-09-21 (×2): qty 1

## 2019-09-21 MED ORDER — FENTANYL CITRATE (PF) 100 MCG/2ML IJ SOLN
INTRAMUSCULAR | Status: AC
  Administered 2019-09-21: 50 ug via INTRAVENOUS
  Filled 2019-09-21: qty 2

## 2019-09-21 MED ORDER — METOCLOPRAMIDE HCL 5 MG/ML IJ SOLN: 5.0000 mg | Freq: Three times a day (TID) | INTRAMUSCULAR | Status: DC | PRN

## 2019-09-21 MED ORDER — ATORVASTATIN CALCIUM 40 MG PO TABS
40.0000 mg | ORAL_TABLET | Freq: Every day | ORAL | Status: DC
  Filled 2019-09-21: qty 1

## 2019-09-21 MED ORDER — 0.9 % SODIUM CHLORIDE (POUR BTL) OPTIME
TOPICAL | Status: DC | PRN
  Administered 2019-09-21: 1000 mL

## 2019-09-21 MED ORDER — PANTOPRAZOLE SODIUM 40 MG PO TBEC
40.0000 mg | DELAYED_RELEASE_TABLET | Freq: Every day | ORAL | Status: DC
  Filled 2019-09-21: qty 1

## 2019-09-21 MED ORDER — OXYCODONE HCL 5 MG/5ML PO SOLN: 5.0000 mg | Freq: Once | ORAL | Status: DC | PRN

## 2019-09-21 MED ORDER — METOCLOPRAMIDE HCL 5 MG PO TABS: 5.0000 mg | ORAL_TABLET | Freq: Three times a day (TID) | ORAL | Status: DC | PRN

## 2019-09-21 MED ORDER — SODIUM CHLORIDE 0.9 % IV SOLN
INTRAVENOUS | Status: DC | PRN
  Administered 2019-09-21: 25 ug/min via INTRAVENOUS

## 2019-09-21 MED ORDER — ONDANSETRON HCL 4 MG PO TABS: 4.0000 mg | ORAL_TABLET | Freq: Four times a day (QID) | ORAL | Status: DC | PRN

## 2019-09-21 MED ORDER — FENTANYL CITRATE (PF) 100 MCG/2ML IJ SOLN
INTRAMUSCULAR | Status: AC
  Filled 2019-09-21: qty 2

## 2019-09-21 MED ORDER — PHENYLEPHRINE HCL (PRESSORS) 10 MG/ML IV SOLN
INTRAVENOUS | Status: AC
  Filled 2019-09-21: qty 1

## 2019-09-21 MED ORDER — OXYCODONE HCL 5 MG PO TABS
10.0000 mg | ORAL_TABLET | ORAL | Status: DC | PRN
  Administered 2019-09-21: 10 mg via ORAL
  Administered 2019-09-21: 15 mg via ORAL
  Filled 2019-09-21: qty 2
  Filled 2019-09-21: qty 3

## 2019-09-21 MED ORDER — OXYCODONE HCL 5 MG PO TABS: 5.0000 mg | ORAL_TABLET | Freq: Once | ORAL | Status: DC | PRN

## 2019-09-21 MED ORDER — METHOCARBAMOL 500 MG IVPB - SIMPLE MED
INTRAVENOUS | Status: AC
  Filled 2019-09-21: qty 50

## 2019-09-21 MED ORDER — STERILE WATER FOR IRRIGATION IR SOLN
Status: DC | PRN
  Administered 2019-09-21: 2000 mL

## 2019-09-21 MED ORDER — METHOCARBAMOL 500 MG IVPB - SIMPLE MED
500.0000 mg | Freq: Four times a day (QID) | INTRAVENOUS | Status: DC | PRN
  Administered 2019-09-21: 500 mg via INTRAVENOUS
  Filled 2019-09-21: qty 50

## 2019-09-21 MED ORDER — DOCUSATE SODIUM 100 MG PO CAPS
100.0000 mg | ORAL_CAPSULE | Freq: Two times a day (BID) | ORAL | Status: DC
  Administered 2019-09-21 – 2019-09-22 (×2): 100 mg via ORAL
  Filled 2019-09-21 (×2): qty 1

## 2019-09-21 MED ORDER — CEFAZOLIN SODIUM-DEXTROSE 2-4 GM/100ML-% IV SOLN
2.0000 g | INTRAVENOUS | Status: AC
  Administered 2019-09-21: 07:00:00 2 g via INTRAVENOUS

## 2019-09-21 MED ORDER — PROPOFOL 10 MG/ML IV BOLUS
INTRAVENOUS | Status: DC | PRN
  Administered 2019-09-21: 20 mg via INTRAVENOUS
  Administered 2019-09-21: 10 mg via INTRAVENOUS

## 2019-09-21 MED ORDER — SODIUM CHLORIDE 0.9 % IV SOLN
INTRAVENOUS | Status: DC
  Administered 2019-09-21 – 2019-09-22 (×2): via INTRAVENOUS

## 2019-09-21 MED ORDER — ONDANSETRON HCL 4 MG/2ML IJ SOLN
4.0000 mg | Freq: Four times a day (QID) | INTRAMUSCULAR | Status: DC | PRN
  Administered 2019-09-21: 4 mg via INTRAVENOUS
  Filled 2019-09-21: qty 2

## 2019-09-21 MED ORDER — POLYETHYLENE GLYCOL 3350 17 G PO PACK: 17.0000 g | PACK | Freq: Every day | ORAL | Status: DC | PRN

## 2019-09-21 MED ORDER — LACTATED RINGERS IV SOLN
INTRAVENOUS | Status: DC
  Administered 2019-09-21 (×2): via INTRAVENOUS

## 2019-09-21 MED ORDER — FENTANYL CITRATE (PF) 100 MCG/2ML IJ SOLN
25.0000 ug | INTRAMUSCULAR | Status: DC | PRN
  Administered 2019-09-21: 10:00:00 50 ug via INTRAVENOUS
  Administered 2019-09-21: 25 ug via INTRAVENOUS

## 2019-09-21 SURGICAL SUPPLY — 39 items
BAG ZIPLOCK 12X15 (MISCELLANEOUS) ×1 IMPLANT
BENZOIN TINCTURE PRP APPL 2/3 (GAUZE/BANDAGES/DRESSINGS) ×1 IMPLANT
BLADE SAW SGTL 18X1.27X75 (BLADE) ×2 IMPLANT
BLADE SURG SZ10 CARB STEEL (BLADE) ×4 IMPLANT
COVER PERINEAL POST (MISCELLANEOUS) ×2 IMPLANT
COVER SURGICAL LIGHT HANDLE (MISCELLANEOUS) ×2 IMPLANT
COVER WAND RF STERILE (DRAPES) IMPLANT
DRAPE STERI IOBAN 125X83 (DRAPES) ×2 IMPLANT
DRAPE U-SHAPE 47X51 STRL (DRAPES) ×4 IMPLANT
DRSG AQUACEL AG ADV 3.5X10 (GAUZE/BANDAGES/DRESSINGS) ×2 IMPLANT
DURAPREP 26ML APPLICATOR (WOUND CARE) ×2 IMPLANT
ELECT REM PT RETURN 15FT ADLT (MISCELLANEOUS) ×2 IMPLANT
GAUZE XEROFORM 1X8 LF (GAUZE/BANDAGES/DRESSINGS) ×2 IMPLANT
GLOVE BIO SURGEON STRL SZ7.5 (GLOVE) ×2 IMPLANT
GLOVE BIOGEL PI IND STRL 8 (GLOVE) ×2 IMPLANT
GLOVE BIOGEL PI INDICATOR 8 (GLOVE) ×2
GLOVE ECLIPSE 8.0 STRL XLNG CF (GLOVE) ×2 IMPLANT
GOWN STRL REUS W/TWL XL LVL3 (GOWN DISPOSABLE) ×4 IMPLANT
HANDPIECE INTERPULSE COAX TIP (DISPOSABLE) ×1
HEAD M SROM 36MM PLUS 1.5 (Hips) IMPLANT
HOLDER FOLEY CATH W/STRAP (MISCELLANEOUS) ×2 IMPLANT
KIT TURNOVER KIT A (KITS) IMPLANT
LINER ACETAB NEUTRAL 36ID 520D (Liner) ×1 IMPLANT
PACK ANTERIOR HIP CUSTOM (KITS) ×2 IMPLANT
PIN SECTOR W/GRIP ACE CUP 52MM (Hips) ×1 IMPLANT
SET HNDPC FAN SPRY TIP SCT (DISPOSABLE) ×1 IMPLANT
SROM M HEAD 36MM PLUS 1.5 (Hips) ×2 IMPLANT
STAPLER VISISTAT 35W (STAPLE) IMPLANT
STEM CORAIL KA12 (Stem) ×1 IMPLANT
STRIP CLOSURE SKIN 1/2X4 (GAUZE/BANDAGES/DRESSINGS) ×1 IMPLANT
SUT ETHIBOND NAB CT1 #1 30IN (SUTURE) ×2 IMPLANT
SUT ETHILON 2 0 PS N (SUTURE) IMPLANT
SUT MNCRL AB 4-0 PS2 18 (SUTURE) IMPLANT
SUT VIC AB 0 CT1 36 (SUTURE) ×2 IMPLANT
SUT VIC AB 1 CT1 36 (SUTURE) ×2 IMPLANT
SUT VIC AB 2-0 CT1 27 (SUTURE) ×2
SUT VIC AB 2-0 CT1 TAPERPNT 27 (SUTURE) ×2 IMPLANT
TRAY FOLEY MTR SLVR 16FR STAT (SET/KITS/TRAYS/PACK) ×2 IMPLANT
YANKAUER SUCT BULB TIP 10FT TU (MISCELLANEOUS) ×2 IMPLANT

## 2019-09-21 NOTE — Transfer of Care (Signed)
Immediate Anesthesia Transfer of Care Note  Patient: Tanya Cortez  Procedure(s) Performed: RIGHT TOTAL HIP ARTHROPLASTY ANTERIOR APPROACH (Right Hip)  Patient Location: PACU  Anesthesia Type:Spinal  Level of Consciousness: awake, alert  and oriented  Airway & Oxygen Therapy: Patient Spontanous Breathing and Patient connected to face mask oxygen  Post-op Assessment: Report given to RN and Post -op Vital signs reviewed and stable  Post vital signs: Reviewed and stable  Last Vitals:  Vitals Value Taken Time  BP 121/69 09/21/19 0838  Temp    Pulse 57 09/21/19 0839  Resp 15 09/21/19 0839  SpO2 100 % 09/21/19 0839  Vitals shown include unvalidated device data.  Last Pain:  Vitals:   09/21/19 0550  TempSrc: Oral      Patients Stated Pain Goal: 3 (21/11/55 2080)  Complications: No apparent anesthesia complications

## 2019-09-21 NOTE — Evaluation (Addendum)
Physical Therapy Evaluation Patient Details Name: Tanya Cortez MRN: [id number] DOB: 1951/01/20 Today's Date: 09/21/2019   History of Present Illness  Pt is 68 y.o. female s/p Rt THA ant approach on 09/21/19. She has PMH significant for HTN, HLD, osteopenia, and Lt THA.    Clinical Impression  Tanya Cortez is a 68 y.o. female POD 0 s/p Rt THA ant approach. Patient reports independence with mobility at baseline and is eager to get home. Patient is now limited by functional impairments (see PT problem list below) and requires min assist for transfers and gait with RW. Patient was able to ambulate ~ 80 feet with RW and min guard/assist. Patient instructed in exercise to facilitate ROM and circulation to manage edema. Patient will benefit from continued skilled PT interventions to address impairments and progress towards PLOF. Acute PT will follow to progress mobility and stair training in preparation for safe discharge home.     Follow Up Recommendations Follow surgeon's recommendation for DC plan and follow-up therapies    Equipment Recommendations  None recommended by PT    Recommendations for Other Services       Precautions / Restrictions Precautions Precautions: Fall Restrictions Weight Bearing Restrictions: No      Mobility  Bed Mobility Overal bed mobility: Needs Assistance Bed Mobility: Supine to Sit     Supine to sit: Supervision;HOB elevated     General bed mobility comments: cues for seqeuncing, no physical assist required, pt using bed rails  Transfers Overall transfer level: Needs assistance Equipment used: Rolling walker (2 wheeled) Transfers: Sit to/from Stand Sit to Stand: Min assist         General transfer comment: verbal cues for safe hand placement, technique, and sequencing, assist to steady upon rising  Ambulation/Gait Ambulation/Gait assistance: Min assist;Min guard Gait Distance (Feet): 80 Feet Assistive device: Rolling walker (2  wheeled) Gait Pattern/deviations: Step-through pattern;Decreased step length - right;Decreased step length - left;Decreased stance time - right;Decreased weight shift to right;Trunk flexed Gait velocity: decreased   General Gait Details: pt with slightly flexed trunk and lookign down requiring cues for safe posture to navigate hallway, pt required ceus ofr safe proximity and hand placement on RW which improved throughout  Stairs            Wheelchair Mobility    Modified Rankin (Stroke Patients Only)       Balance Overall balance assessment: Needs assistance Sitting-balance support: Feet supported;No upper extremity supported Sitting balance-Leahy Scale: Good       Standing balance-Leahy Scale: Fair Standing balance comment: pt requries support for dynamic activity            Pertinent Vitals/Pain Pain Assessment: 0-10 Pain Score: 6  Pain Location: Rt hip Pain Descriptors / Indicators: Sore;Aching Pain Intervention(s): Limited activity within patient's tolerance;Monitored during session;Ice applied    Home Living Family/patient expects to be discharged to:: Private residence Living Arrangements: Other relatives;Non-relatives/Friends(pt has a housemate) Available Help at Discharge: Family;Friend(s)(her housemate and sister will help her during her recovery) Type of Home: House Home Access: Level entry     Home Layout: Able to live on main level with bedroom/bathroom Home Equipment: Walker - 2 wheels;Cane - single point;Shower seat;Grab bars - tub/shower      Prior Function Level of Independence: Independent               Hand Dominance        Extremity/Trunk Assessment   Upper Extremity Assessment Upper Extremity Assessment: Overall WFL for  tasks assessed    Lower Extremity Assessment Lower Extremity Assessment: RLE deficits/detail RLE Deficits / Details: pt able to perform quad set and heel slide, sensation appears intact for Rt LE     Cervical / Trunk Assessment Cervical / Trunk Assessment: Normal  Communication   Communication: No difficulties  Cognition Arousal/Alertness: Awake/alert Behavior During Therapy: WFL for tasks assessed/performed;Flat affect Overall Cognitive Status: Within Functional Limits for tasks assessed                   General Comments      Exercises Total Joint Exercises Ankle Circles/Pumps: AROM;Both;Seated Quad Sets: AROM;Right;5 reps;Supine Heel Slides: AROM;Right;5 reps;Supine   Assessment/Plan    PT Assessment Patient needs continued PT services  PT Problem List Decreased strength;Decreased activity tolerance;Decreased mobility;Decreased balance;Decreased range of motion       PT Treatment Interventions Gait training;DME instruction;Stair training;Therapeutic activities;Balance training;Therapeutic exercise;Functional mobility training;Patient/family education;Modalities    PT Goals (Current goals can be found in the Care Plan section)  Acute Rehab PT Goals Patient Stated Goal: patient wants to go home PT Goal Formulation: With patient Time For Goal Achievement: 09/28/19 Potential to Achieve Goals: Good    Frequency 7X/week    AM-PAC PT "6 Clicks" Mobility  Outcome Measure Help needed turning from your back to your side while in a flat bed without using bedrails?: A Little Help needed moving from lying on your back to sitting on the side of a flat bed without using bedrails?: A Little Help needed moving to and from a bed to a chair (including a wheelchair)?: A Little Help needed standing up from a chair using your arms (e.g., wheelchair or bedside chair)?: A Little Help needed to walk in hospital room?: A Little Help needed climbing 3-5 steps with a railing? : A Little 6 Click Score: 18    End of Session Equipment Utilized During Treatment: Gait belt Activity Tolerance: Patient tolerated treatment well Patient left: in chair;with call bell/phone within  reach;with chair alarm set Nurse Communication: Mobility status PT Visit Diagnosis: Other abnormalities of gait and mobility (R26.89);Difficulty in walking, not elsewhere classified (R26.2);Muscle weakness (generalized) (M62.81)    Time: 5329-9242 PT Time Calculation (min) (ACUTE ONLY): 29 min   Charges:   PT Evaluation $PT Eval Low Complexity: 1 Low PT Treatments $Therapeutic Exercise: 8-22 mins      Kipp Brood, PT, DPT, [facility] Physical Therapist with [facility]  09/21/2019 1:21 PM

## 2019-09-21 NOTE — Anesthesia Postprocedure Evaluation (Signed)
Anesthesia Post Note  Patient: Tanya Cortez  Procedure(s) Performed: RIGHT TOTAL HIP ARTHROPLASTY ANTERIOR APPROACH (Right Hip)     Patient location during evaluation: PACU Anesthesia Type: Spinal Level of consciousness: oriented and awake and alert Pain management: pain level controlled Vital Signs Assessment: post-procedure vital signs reviewed and stable Respiratory status: spontaneous breathing, respiratory function stable and nonlabored ventilation Cardiovascular status: blood pressure returned to baseline and stable Postop Assessment: no headache, no backache, no apparent nausea or vomiting and spinal receding Anesthetic complications: no    Last Vitals:  Vitals:   09/21/19 1227 09/21/19 1332  BP: (!) 121/50 129/70  Pulse: 65 70  Resp:    Temp:    SpO2: 100% 100%    Last Pain:  Vitals:   09/21/19 1200  TempSrc:   PainSc: 4                  Lidia Collum

## 2019-09-21 NOTE — Anesthesia Procedure Notes (Signed)
Procedure Name: MAC Date/Time: 09/21/2019 7:09 AM Performed by: Maxwell Caul, CRNA Pre-anesthesia Checklist: Patient identified, Emergency Drugs available, Suction available and Patient being monitored Patient Re-evaluated:Patient Re-evaluated prior to induction Oxygen Delivery Method: Simple face mask

## 2019-09-21 NOTE — Brief Op Note (Signed)
09/21/2019  8:18 AM  PATIENT:  Elmon Kirschner  68 y.o. female  PRE-OPERATIVE DIAGNOSIS:  osteoarthritis right hip  POST-OPERATIVE DIAGNOSIS:  osteoarthritis right hip  PROCEDURE:  Procedure(s): RIGHT TOTAL HIP ARTHROPLASTY ANTERIOR APPROACH (Right)  SURGEON:  Surgeon(s) and Role:    Mcarthur Rossetti, MD - Primary  PHYSICIAN ASSISTANT:  Benita Stabile, PA-C  ANESTHESIA:   spinal  EBL:  100 mL   COUNTS:  YES  DICTATION: .Other Dictation: Dictation Number [id number]  PLAN OF CARE: Admit to inpatient   PATIENT DISPOSITION:  PACU - hemodynamically stable.   Delay start of Pharmacological VTE agent (>24hrs) due to surgical blood loss or risk of bleeding: no

## 2019-09-21 NOTE — H&P (Signed)
TOTAL HIP ADMISSION H&P  Patient is admitted for right total hip arthroplasty.  Subjective:  Chief Complaint: right hip pain  HPI: Tanya Cortez, 68 y.o. female, has a history of pain and functional disability in the right hip(s) due to arthritis and patient has failed non-surgical conservative treatments for greater than 12 weeks to include NSAID's and/or analgesics, flexibility and strengthening excercises, use of assistive devices and activity modification.  Onset of symptoms was gradual starting 2 years ago with gradually worsening course since that time.The patient noted no past surgery on the right hip(s).  Patient currently rates pain in the right hip at 10 out of 10 with activity. Patient has night pain, worsening of pain with activity and weight bearing, pain that interfers with activities of daily living and pain with passive range of motion. Patient has evidence of subchondral cysts, subchondral sclerosis, periarticular osteophytes and joint space narrowing by imaging studies. This condition presents safety issues increasing the risk of falls.  There is no current active infection.  Patient Active Problem List   Diagnosis Date Noted  . Unilateral primary osteoarthritis, right hip 08/21/2019  . Unilateral primary osteoarthritis, left hip 12/15/2018  . Status post total replacement of left hip 12/15/2018  . Degenerative joint disease (DJD) of hip 06/13/2018  . Osteopenia determined by x-ray 04/20/2018  . Tendonitis involving hip abductors 04/20/2018  . Heart murmur 05/24/2014  . Hyperlipidemia 04/22/2014  . Essential hypertension, benign 04/22/2014   Past Medical History:  Diagnosis Date  . Degenerative joint disease (DJD) of hip 06/13/2018  . Essential hypertension, benign 04/22/2014  . Hyperlipidemia 04/22/2014  . Osteopenia determined by x-ray 04/20/2018   T score -2.1 on Dexa scan 2017    Past Surgical History:  Procedure Laterality Date  . TONSILLECTOMY    . TOTAL HIP  ARTHROPLASTY Left 12/15/2018   Procedure: LEFT TOTAL HIP ARTHROPLASTY ANTERIOR APPROACH;  Surgeon: Mcarthur Rossetti, MD;  Location: [facility];  Service: Orthopedics;  Laterality: Left;    Current Facility-Administered Medications  Medication Dose Route Frequency Provider Last Rate Last Dose  . ceFAZolin (ANCEF) 2-4 GM/100ML-% IVPB           . ceFAZolin (ANCEF) IVPB 2g/100 mL premix  2 g Intravenous On Call to OR Pete Pelt, PA-C      . chlorhexidine (HIBICLENS) 4 % liquid 4 application  60 mL Topical Once Erskine Emery W, PA-C      . lactated ringers infusion   Intravenous Continuous Lidia Collum, MD 20 mL/hr at 09/21/19 0558    . tranexamic acid (CYKLOKAPRON) 1000MG /163mL IVPB           . tranexamic acid (CYKLOKAPRON) IVPB 1,000 mg  1,000 mg Intravenous To OR Pete Pelt, PA-C       No Known Allergies  Social History   Tobacco Use  . Smoking status: Never Smoker  . Smokeless tobacco: Never Used  Substance Use Topics  . Alcohol use: No    Family History  Problem Relation Age of Onset  . Skin cancer Mother        squamous cell   . Hypertension Mother   . Breast cancer Mother   . COPD Mother   . Heart attack Maternal Grandfather   . Stroke Maternal Grandfather   . Heart attack Father        died 68 yo  . Hypertension Sister      Review of Systems  Musculoskeletal: Positive for joint pain.  All other  systems reviewed and are negative.   Objective:  Physical Exam  Constitutional: She is oriented to person, place, and time. She appears well-developed and well-nourished.  HENT:  Head: Normocephalic and atraumatic.  Eyes: Pupils are equal, round, and reactive to light. EOM are normal.  Neck: Normal range of motion. Neck supple.  Cardiovascular: Normal rate.  Respiratory: Effort normal.  GI: Soft.  Musculoskeletal:     Right hip: She exhibits decreased range of motion, decreased strength, tenderness and bony tenderness.  Neurological: She is alert and  oriented to person, place, and time.  Skin: Skin is warm and dry.  Psychiatric: She has a normal mood and affect.    Vital signs in last 24 hours: Temp:  [98.8 F (37.1 C)] 98.8 F (37.1 C) (09/25 0550) Pulse Rate:  [75] 75 (09/25 0550) Resp:  [18] 18 (09/25 0550) BP: (139)/(78) 139/78 (09/25 0550) SpO2:  [100 %] 100 % (09/25 0550) Weight:  [28.2 kg] 28.2 kg (09/25 0553)  Labs:   Estimated body mass index is 10.67 kg/m as calculated from the following:   Height as of this encounter: 5\' 4"  (1.626 m).   Weight as of this encounter: 28.2 kg.   Imaging Review Plain radiographs demonstrate severe degenerative joint disease of the right hip(s). The bone quality appears to be excellent for age and reported activity level.      Assessment/Plan:  End stage arthritis, right hip(s)  The patient history, physical examination, clinical judgement of the provider and imaging studies are consistent with end stage degenerative joint disease of the right hip(s) and total hip arthroplasty is deemed medically necessary. The treatment options including medical management, injection therapy, arthroscopy and arthroplasty were discussed at length. The risks and benefits of total hip arthroplasty were presented and reviewed. The risks due to aseptic loosening, infection, stiffness, dislocation/subluxation,  thromboembolic complications and other imponderables were discussed.  The patient acknowledged the explanation, agreed to proceed with the plan and consent was signed. Patient is being admitted for inpatient treatment for surgery, pain control, PT, OT, prophylactic antibiotics, VTE prophylaxis, progressive ambulation and ADL's and discharge planning.The patient is planning to be discharged home with home health services

## 2019-09-21 NOTE — Anesthesia Procedure Notes (Signed)
Spinal  Patient location during procedure: OR Staffing Anesthesiologist: Tresia Revolorio E, MD Performed: anesthesiologist  Preanesthetic Checklist Completed: patient identified, surgical consent, pre-op evaluation, timeout performed, IV checked, risks and benefits discussed and monitors and equipment checked Spinal Block Patient position: sitting Prep: site prepped and draped and DuraPrep Patient monitoring: continuous pulse ox, blood pressure and heart rate Approach: midline Location: L3-4 Injection technique: single-shot Needle Needle type: Pencan  Needle gauge: 24 G Needle length: 9 cm Additional Notes Functioning IV was confirmed and monitors were applied. Sterile prep and drape, including hand hygiene and sterile gloves were used. The patient was positioned and the spine was prepped. The skin was anesthetized with lidocaine.  Free flow of clear CSF was obtained prior to injecting local anesthetic into the CSF. The needle was carefully withdrawn. The patient tolerated the procedure well.      

## 2019-09-21 NOTE — Op Note (Signed)
NAME: Tanya Cortez, Tanya A. MEDICAL RECORD XQ:[id number] ACCOUNT [id number] DATE OF BIRTH:March 20, 1951 FACILITY: [facility] LOCATION: [facility] PHYSICIAN:Kahari Critzer Aretha Parrot, MD  OPERATIVE REPORT  DATE OF PROCEDURE:  09/21/2019  PREOPERATIVE DIAGNOSIS:  Primary osteoarthritis and degenerative joint disease, right hip.  POSTOPERATIVE DIAGNOSIS:  Primary osteoarthritis and degenerative joint disease, right hip.  PROCEDURE:  Right total hip arthroplasty through direct anterior approach.  IMPLANTS:  DePuy Sector Gription acetabular component size 52, size 36+0 neutral polyethylene liner, size 12 Corail femoral component with standard offset, size 36+1.5 metal hip ball.  SURGEON:  Vanita Panda. Magnus Ivan, MD  ASSISTANT:  Richardean Canal, PA-C  ANESTHESIA:  Spinal.  ANTIBIOTICS:  Two grams IV Ancef.  ESTIMATED BLOOD LOSS:  100 mL.  COMPLICATIONS:  None.  INDICATIONS:  The patient is a 68 year old female well known to me.  She has debilitating arthritis in both her hips.  In December 2019, she underwent a successful left total hip arthroplasty.  Her right hip shows radiographic evidence of end-stage  arthritis.  Her right hip pain is daily, and it has detrimentally affected her mobility, her quality of life, and activities of daily living to the point she does wish to proceed with a total hip arthroplasty, and this has been recommended to her as  well.  Having had this before, she fully understands the risk of acute blood loss anemia, nerve and vessel injury, fracture, infection, dislocation, DVT.  She understands there is risk of implant failure.  She understands her goals are to decrease pain,  improve mobility, and overall improve quality of life.  DESCRIPTION OF PROCEDURE:  After informed consent was obtained and her right hip was marked, she was brought to the operating room and sat up on a stretcher where spinal anesthesia was obtained.  She was then laid in supine position on a stretcher.    Foley catheter was placed.  I assessed her leg lengths and found them to be equal.  Traction boots were placed on both her feet.  Next, she was placed supine on the Hana fracture table with the perineal post in place and both legs in in-line skeletal  traction device and no traction applied.  Her right operative hip was prepped and draped with DuraPrep and sterile drapes.  Time-out was called and she was identified as correct patient and correct right hip.  We then made an incision just inferior and  posterior to the anterior iliac spine and carried this obliquely down the leg.  We dissected down the tensor fascia lata muscle, and tensor fascia was then divided longitudinally to proceed with direct anterior approach to the hip.  We identified and  cauterized circumflex vessels.  I then identified the hip capsule, opened up the hip capsule in an L-type format, finding a moderate joint effusion and significant arthritis around the femoral head and neck.  We placed Cobra retractors around the medial  and lateral femoral neck and then made our femoral neck cut with an oscillating saw just proximal to the lesser trochanter and completed this with an osteotome.  We placed a corkscrew guide in the femoral head and removed the femoral head in its entirety  and found a wide area devoid of cartilage.  I then placed a bent Hohmann over the medial acetabular rim and removed remnants of the acetabular labrum and other debris.  We then began reaming under direct visualization from a size 42 reamer in stepwise  increments up to a size 51 with all reamers placed under direct visualization,  the last reamer placed under direct fluoroscopy so we could obtain our depth of reaming, our inclination and anteversion.  I then placed the real DePuy Sector Gription  acetabular component size 52 and a 36+0 neutral polyethylene liner for that size acetabular component.  Attention was then turned to the femur.  With the leg externally  rotated to 120 degrees, extended and adducted, we were able to place a Mueller  retractor medially and a Hohmann retractor behind the greater trochanter.  We released the lateral joint capsule and used a box-cutting osteotome to enter the femoral canal and a rongeur to lateralize.  We then began broaching using Corail broaching  system from a size 8 going up to a size 12.  With the size 12 in place, we trialed a standard offset femoral neck and a 36+1.5 hip ball, reduced this in the acetabulum.  We were pleased with the range of motion, leg length, offset, and stability assessed  radiographically and clinically.  We then dislocated the hip and removed the trial components.  I placed the real Corail femoral component size 12 with standard offset and the real 36+1.5 metal hip ball and again reduced this in the acetabulum.  We were  pleased with the leg length, offset, range of motion, and stability.  We then irrigated the soft tissue with normal saline solution using pulsatile lavage.  We were able to close the joint capsule with interrupted #1 Ethibond suture, followed by closing  the tensor fascia with interrupted #1 Vicryl, 0 Vicryl was used to close the deep tissue, 2-0 Vicryl was used to close the subcutaneous tissue, and a 4-0 Monocryl subcuticular stitch was placed.  Steri-Strips were placed on the skin, and an Aquacel  dressing was placed as well.  She was then taken off the Hana table and taken to recovery room in stable condition.  All final counts were correct.  There were no complications noted.  Note Benita Stabile, PA-C, assisted the entire case.  His assistance was  crucial for facilitating all aspects of this case.  LN/NUANCE  D:09/21/2019 T:09/21/2019 JOB:[id number]

## 2019-09-22 DIAGNOSIS — M1611 Unilateral primary osteoarthritis, right hip: Secondary | ICD-10-CM | POA: Diagnosis not present

## 2019-09-22 LAB — BASIC METABOLIC PANEL
Anion gap: 8 (ref 5–15)
BUN: 10 mg/dL (ref 8–23)
CO2: 24 mmol/L (ref 22–32)
Calcium: 8 mg/dL — ABNORMAL LOW (ref 8.9–10.3)
Chloride: 102 mmol/L (ref 98–111)
Creatinine, Ser: 0.62 mg/dL (ref 0.44–1.00)
GFR calc Af Amer: >60 mL/min (ref >60)
GFR calc non Af Amer: >60 mL/min (ref >60)
Glucose, Bld: 118 mg/dL — ABNORMAL HIGH (ref 70–99)
Potassium: 3.5 mmol/L (ref 3.5–5.1)
Sodium: 134 mmol/L — ABNORMAL LOW (ref 135–145)

## 2019-09-22 LAB — CBC
HCT: 34.6 % — ABNORMAL LOW (ref 36.0–46.0)
Hemoglobin: 11.2 g/dL — ABNORMAL LOW (ref 12.0–15.0)
MCH: 31.1 pg (ref 26.0–34.0)
MCHC: 32.4 g/dL (ref 30.0–36.0)
MCV: 96.1 fL (ref 80.0–100.0)
Platelets: 162 10*3/uL (ref 150–400)
RBC: 3.6 MIL/uL — ABNORMAL LOW (ref 3.87–5.11)
RDW: 12.3 % (ref 11.5–15.5)
WBC: 9.2 10*3/uL (ref 4.0–10.5)
nRBC: 0 % (ref 0.0–0.2)

## 2019-09-22 MED ORDER — HYDROCODONE-ACETAMINOPHEN 5-325 MG PO TABS: 1.0000 | ORAL_TABLET | Freq: Four times a day (QID) | ORAL | 0 refills | Status: DC | PRN

## 2019-09-22 MED ORDER — METHOCARBAMOL 500 MG PO TABS: 500.0000 mg | ORAL_TABLET | Freq: Four times a day (QID) | ORAL | 0 refills | Status: DC | PRN

## 2019-09-22 MED ORDER — ASPIRIN 81 MG PO CHEW: 81.0000 mg | CHEWABLE_TABLET | Freq: Two times a day (BID) | ORAL | 0 refills | Status: DC

## 2019-09-22 NOTE — Discharge Instructions (Signed)

## 2019-09-22 NOTE — Progress Notes (Signed)
     Subjective: 1 Day Post-Op Procedure(s) (LRB): RIGHT TOTAL HIP ARTHROPLASTY ANTERIOR APPROACH (Right) Awake, alert and oriented x 4. Dressing right anterior hip is dry no spotting. Mild swelling. Pain is controlled with meds. I'm going home today.   Patient reports pain as mild.    Objective:   VITALS:  Temp:  [97.7 F (36.5 C)-99.9 F (37.7 C)] 99.9 F (37.7 C) (09/26 0612) Pulse Rate:  [57-81] 81 (09/26 0612) Resp:  [12-20] 19 (09/26 0119) BP: (103-139)/(50-118) 113/59 (09/26 0612) SpO2:  [94 %-100 %] 94 % (09/26 0612) Weight:  [62 kg] 62 kg (09/25 1022)  Neurologically intact ABD soft Neurovascular intact Sensation intact distally Intact pulses distally Dorsiflexion/Plantar flexion intact Incision: dressing C/D/I and no drainage   LABS Recent Labs    09/22/19 0314  HGB 11.2*  WBC 9.2  PLT 162   Recent Labs    09/22/19 0314  NA 134*  K 3.5  CL 102  CO2 24  BUN 10  CREATININE 0.62  GLUCOSE 118*   No results for input(s): LABPT, INR in the last 72 hours.   Assessment/Plan: 1 Day Post-Op Procedure(s) (LRB): RIGHT TOTAL HIP ARTHROPLASTY ANTERIOR APPROACH (Right)  Advance diet Up with therapy Discharge home with home health  Basil Dess 09/22/2019, 8:34 AMPatient ID: Tanya Cortez, female   DOB: 1951-02-25, 68 y.o.   MRN: [id number]

## 2019-09-22 NOTE — Progress Notes (Signed)
Physical Therapy Treatment Patient Details Name: Tanya Cortez MRN: [id number] DOB: 10/18/1951 Today's Date: 09/22/2019    History of Present Illness Pt is 68 y.o. female s/p Rt THA ant approach on 09/21/19. She has PMH significant for HTN, HLD, osteopenia, and Lt THA.    PT Comments    POD # 1 am session Pt dressed and eager to D/C to home.  This is pt's secind THR, ro very knowledgeable.  Assisted with amb a functional distance in hallway.  Then returned to room to perform some TE's following HEP handout.  Instructed on proper tech, freq as well as use of ICE.   Addressed all mobility questions, discussed appropriate activity, educated on use of ICE.  Pt ready for D/C to home. No stairs to enter home.    Follow Up Recommendations  Follow surgeon's recommendation for DC plan and follow-up therapies     Equipment Recommendations  None recommended by PT    Recommendations for Other Services       Precautions / Restrictions Precautions Precautions: Fall Restrictions Weight Bearing Restrictions: No    Mobility  Bed Mobility               General bed mobility comments: OOB in recliner  Transfers Overall transfer level: Needs assistance Equipment used: Rolling walker (2 wheeled) Transfers: Sit to/from Stand Sit to Stand: Supervision         General transfer comment: good safety cognition and use of hands to steady self  Ambulation/Gait Ambulation/Gait assistance: Supervision Gait Distance (Feet): 75 Feet Assistive device: Rolling walker (2 wheeled) Gait Pattern/deviations: Step-through pattern;Decreased step length - right;Decreased step length - left;Decreased stance time - right;Decreased weight shift to right;Trunk flexed     General Gait Details: pt with slightly flexed trunk and lookign down requiring cues for safe posture to navigate hallway, pt required ceus ofr safe proximity and hand placement on RW which improved throughout   Stairs             Wheelchair Mobility    Modified Rankin (Stroke Patients Only)       Balance                                            Cognition Arousal/Alertness: Awake/alert Behavior During Therapy: WFL for tasks assessed/performed;Flat affect Overall Cognitive Status: Within Functional Limits for tasks assessed                                        Exercises   Total Hip Replacement TE's 10 reps ankle pumps 10 reps knee presses 10 reps heel slides 10 reps SAQ's 10 reps ABD 10 reps all standing TE's Followed by ICE     General Comments        Pertinent Vitals/Pain Pain Assessment: 0-10 Pain Score: 3  Pain Location: Rt hip Pain Descriptors / Indicators: Sore;Aching Pain Intervention(s): Monitored during session;Repositioned;Ice applied    Home Living                      Prior Function            PT Goals (current goals can now be found in the care plan section) Progress towards PT goals: Progressing toward goals    Frequency  7X/week      PT Plan Current plan remains appropriate    Co-evaluation              AM-PAC PT "6 Clicks" Mobility   Outcome Measure  Help needed turning from your back to your side while in a flat bed without using bedrails?: None Help needed moving from lying on your back to sitting on the side of a flat bed without using bedrails?: None Help needed moving to and from a bed to a chair (including a wheelchair)?: None Help needed standing up from a chair using your arms (e.g., wheelchair or bedside chair)?: None Help needed to walk in hospital room?: None Help needed climbing 3-5 steps with a railing? : A Little 6 Click Score: 23    End of Session Equipment Utilized During Treatment: Gait belt Activity Tolerance: Patient tolerated treatment well Patient left: in chair;with call bell/phone within reach;with chair alarm set Nurse Communication: Mobility status PT Visit Diagnosis:  Other abnormalities of gait and mobility (R26.89);Difficulty in walking, not elsewhere classified (R26.2);Muscle weakness (generalized) (M62.81)     Time: 1275-1700 PT Time Calculation (min) (ACUTE ONLY): 24 min  Charges:  $Gait Training: 8-22 mins $Therapeutic Exercise: 8-22 mins                     Rica Koyanagi  PTA Acute  Rehabilitation Services Pager      [phone] Office      [phone]

## 2019-09-24 ENCOUNTER — Encounter (HOSPITAL_COMMUNITY): Payer: Self-pay | Admitting: Orthopaedic Surgery

## 2019-09-25 NOTE — Discharge Summary (Signed)
Patient ID: Tanya Cortez MRN: [id number] DOB/AGE: [birth year] 68 y.o.  Admit date: 09/21/2019 Discharge date: 09/25/2019  Admission Diagnoses:  Principal Problem:   Unilateral primary osteoarthritis, right hip Active Problems:   Status post total replacement of right hip   Discharge Diagnoses:  Same  Past Medical History:  Diagnosis Date  . Degenerative joint disease (DJD) of hip 06/13/2018  . Essential hypertension, benign 04/22/2014  . Hyperlipidemia 04/22/2014  . Osteopenia determined by x-ray 04/20/2018   T score -2.1 on Dexa scan 2017    Surgeries: Procedure(s): RIGHT TOTAL HIP ARTHROPLASTY ANTERIOR APPROACH on 09/21/2019   Consultants:   Discharged Condition: Improved  Hospital Course: Tanya Cortez is an 68 y.o. female who was admitted 09/21/2019 for operative treatment ofUnilateral primary osteoarthritis, right hip. Patient has severe unremitting pain that affects sleep, daily activities, and work/hobbies. After pre-op clearance the patient was taken to the operating room on 09/21/2019 and underwent  Procedure(s): RIGHT TOTAL HIP ARTHROPLASTY ANTERIOR APPROACH.    Patient was given perioperative antibiotics:  Anti-infectives (From admission, onward)   Start     Dose/Rate Route Frequency Ordered Stop   09/21/19 1330  ceFAZolin (ANCEF) IVPB 1 g/50 mL premix     1 g 100 mL/hr over 30 Minutes Intravenous Every 6 hours 09/21/19 1034 09/21/19 2038   09/21/19 0600  ceFAZolin (ANCEF) IVPB 2g/100 mL premix     2 g 200 mL/hr over 30 Minutes Intravenous On call to O.R. 09/21/19 0539 09/21/19 0736   09/21/19 0543  ceFAZolin (ANCEF) 2-4 GM/100ML-% IVPB    Note to Pharmacy: Vevelyn RoyalsHarvell, Gwendolyn  : cabinet override      09/21/19 0543 09/21/19 0721       Patient was given sequential compression devices, early ambulation, and chemoprophylaxis to prevent DVT.  Patient benefited maximally from hospital stay and there were no complications.    Recent vital signs: No data found.    Recent laboratory studies: No results for input(s): WBC, HGB, HCT, PLT, NA, K, CL, CO2, BUN, CREATININE, GLUCOSE, INR, CALCIUM in the last 72 hours.  Invalid input(s): PT, 2   Discharge Medications:   Allergies as of 09/22/2019   No Known Allergies     Medication List    TAKE these medications   alendronate 70 MG tablet Commonly known as: FOSAMAX TAKE 1 TABLET BY MOUTH EVERY 7 DAYS. TAKE  WITH  A  FULL  GLASS  OF  WATER  ON  AN  EMPTY  STOMACH. What changed: See the new instructions.   aspirin 81 MG chewable tablet Chew 1 tablet (81 mg total) by mouth 2 (two) times daily.   atorvastatin 40 MG tablet Commonly known as: LIPITOR Take 1 tablet by mouth once daily   Caltrate 600+D Plus Minerals 600-800 MG-UNIT Tabs Take 1 tablet by mouth 2 (two) times daily.   HYDROcodone-acetaminophen 5-325 MG tablet Commonly known as: Norco Take 1-2 tablets by mouth every 6 (six) hours as needed for moderate pain. Notes to patient: Last dose:     methocarbamol 500 MG tablet Commonly known as: ROBAXIN Take 1 tablet (500 mg total) by mouth every 6 (six) hours as needed for muscle spasms. Notes to patient: Last dose:       Diagnostic Studies: Dg Pelvis Portable  Result Date: 09/21/2019 CLINICAL DATA:  68 year old female with a history of hip replacement EXAM: PORTABLE PELVIS 1-2 VIEWS COMPARISON:  December 15, 2018 FINDINGS: Surgical changes of right hip arthroplasty with gas and swelling at the surgical bed.  No fracture identified. Redemonstration of surgical changes of left hip arthroplasty. IMPRESSION: Early surgical changes of right hip arthroplasty with no complicating features. Redemonstration of postoperative changes of prior left hip arthroplasty. Electronically Signed   By: Gilmer Mor D.O.   On: 09/21/2019 09:18   Dg C-arm 1-60 Min-no Report  Result Date: 09/21/2019 CLINICAL DATA:  Intraoperative imaging for right hip replacement. EXAM: OPERATIVE RIGHT HIP (WITH PELVIS IF  PERFORMED) 2 VIEWS TECHNIQUE: Fluoroscopic spot image(s) were submitted for interpretation post-operatively. COMPARISON:  Plain film of the pelvis 12/15/2018. FINDINGS: Images provided demonstrate a new right total hip arthroplasty in place. No acute abnormality is identified. Gas in the soft tissues from surgery is noted. Left hip arthroplasty seen on the prior examination noted. IMPRESSION: Intraoperative imaging for right hip replacement.  No acute finding. Electronically Signed   By: Drusilla Kanner M.D.   On: 09/21/2019 11:46   Dg Hip Operative Unilat W Or W/o Pelvis Right  Result Date: 09/21/2019 CLINICAL DATA:  Intraoperative imaging for right hip replacement. EXAM: OPERATIVE RIGHT HIP (WITH PELVIS IF PERFORMED) 2 VIEWS TECHNIQUE: Fluoroscopic spot image(s) were submitted for interpretation post-operatively. COMPARISON:  Plain film of the pelvis 12/15/2018. FINDINGS: Images provided demonstrate a new right total hip arthroplasty in place. No acute abnormality is identified. Gas in the soft tissues from surgery is noted. Left hip arthroplasty seen on the prior examination noted. IMPRESSION: Intraoperative imaging for right hip replacement.  No acute finding. Electronically Signed   By: Drusilla Kanner M.D.   On: 09/21/2019 11:46    Disposition:   Discharge Instructions    Call MD / Call 911   Complete by: As directed    If you experience chest pain or shortness of breath, CALL 911 and be transported to the hospital emergency room.  If you develope a fever above 101 F, pus (white drainage) or increased drainage or redness at the wound, or calf pain, call your surgeon's office.   Constipation Prevention   Complete by: As directed    Drink plenty of fluids.  Prune juice may be helpful.  You may use a stool softener, such as Colace (over the counter) 100 mg twice a day.  Use MiraLax (over the counter) for constipation as needed.   Diet - low sodium heart healthy   Complete by: As directed     Discharge instructions   Complete by: As directed    INSTRUCTIONS AFTER JOINT REPLACEMENT   Remove items at home which could result in a fall. This includes throw rugs or furniture in walking pathways ICE to the affected joint every three hours while awake for 30 minutes at a time, for at least the first 3-5 days, and then as needed for pain and swelling.  Continue to use ice for pain and swelling. You may notice swelling that will progress down to the foot and ankle.  This is normal after surgery.  Elevate your leg when you are not up walking on it.   Continue to use the breathing machine you got in the hospital (incentive spirometer) which will help keep your temperature down.  It is common for your temperature to cycle up and down following surgery, especially at night when you are not up moving around and exerting yourself.  The breathing machine keeps your lungs expanded and your temperature down.   DIET:  As you were doing prior to hospitalization, we recommend a well-balanced diet.  DRESSING / WOUND CARE / SHOWERING  Keep the surgical  dressing until follow up.  The dressing is water proof, so you can shower without any extra covering.  IF THE DRESSING FALLS OFF or the wound gets wet inside, change the dressing with sterile gauze.  Please use good hand washing techniques before changing the dressing.  Do not use any lotions or creams on the incision until instructed by your surgeon.    ACTIVITY  Increase activity slowly as tolerated, but follow the weight bearing instructions below.   No driving for 6 weeks or until further direction given by your physician.  You cannot drive while taking narcotics.  No lifting or carrying greater than 10 lbs. until further directed by your surgeon. Avoid periods of inactivity such as sitting longer than an hour when not asleep. This helps prevent blood clots.  You may return to work once you are authorized by your doctor.     WEIGHT BEARING   Weight  bearing as tolerated with assist device (walker, cane, etc) as directed, use it as long as suggested by your surgeon or therapist, typically at least 4-6 weeks.   EXERCISES  Results after joint replacement surgery are often greatly improved when you follow the exercise, range of motion and muscle strengthening exercises prescribed by your doctor. Safety measures are also important to protect the joint from further injury. Any time any of these exercises cause you to have increased pain or swelling, decrease what you are doing until you are comfortable again and then slowly increase them. If you have problems or questions, call your caregiver or physical therapist for advice.   Rehabilitation is important following a joint replacement. After just a few days of immobilization, the muscles of the leg can become weakened and shrink (atrophy).  These exercises are designed to build up the tone and strength of the thigh and leg muscles and to improve motion. Often times heat used for twenty to thirty minutes before working out will loosen up your tissues and help with improving the range of motion but do not use heat for the first two weeks following surgery (sometimes heat can increase post-operative swelling).   These exercises can be done on a training (exercise) mat, on the floor, on a table or on a bed. Use whatever works the best and is most comfortable for you.    Use music or television while you are exercising so that the exercises are a pleasant break in your day. This will make your life better with the exercises acting as a break in your routine that you can look forward to.   Perform all exercises about fifteen times, three times per day or as directed.  You should exercise both the operative leg and the other leg as well.  Exercises include:   Quad Sets - Tighten up the muscle on the front of the thigh (Quad) and hold for 5-10 seconds.   Straight Leg Raises - With your knee straight (if you  were given a brace, keep it on), lift the leg to 60 degrees, hold for 3 seconds, and slowly lower the leg.  Perform this exercise against resistance later as your leg gets stronger.  Leg Slides: Lying on your back, slowly slide your foot toward your buttocks, bending your knee up off the floor (only go as far as is comfortable). Then slowly slide your foot back down until your leg is flat on the floor again.  Angel Wings: Lying on your back spread your legs to the side as far apart as  you can without causing discomfort.  Hamstring Strength:  Lying on your back, push your heel against the floor with your leg straight by tightening up the muscles of your buttocks.  Repeat, but this time bend your knee to a comfortable angle, and push your heel against the floor.  You may put a pillow under the heel to make it more comfortable if necessary.   A rehabilitation program following joint replacement surgery can speed recovery and prevent re-injury in the future due to weakened muscles. Contact your doctor or a physical therapist for more information on knee rehabilitation.    CONSTIPATION  Constipation is defined medically as fewer than three stools per week and severe constipation as less than one stool per week.  Even if you have a regular bowel pattern at home, your normal regimen is likely to be disrupted due to multiple reasons following surgery.  Combination of anesthesia, postoperative narcotics, change in appetite and fluid intake all can affect your bowels.   YOU MUST use at least one of the following options; they are listed in order of increasing strength to get the job done.  They are all available over the counter, and you may need to use some, POSSIBLY even all of these options:    Drink plenty of fluids (prune juice may be helpful) and high fiber foods Colace 100 mg by mouth twice a day  Senokot for constipation as directed and as needed Dulcolax (bisacodyl), take with full glass of water   Miralax (polyethylene glycol) once or twice a day as needed.  If you have tried all these things and are unable to have a bowel movement in the first 3-4 days after surgery call either your surgeon or your primary doctor.    If you experience loose stools or diarrhea, hold the medications until you stool forms back up.  If your symptoms do not get better within 1 week or if they get worse, check with your doctor.  If you experience "the worst abdominal pain ever" or develop nausea or vomiting, please contact the office immediately for further recommendations for treatment.   ITCHING:  If you experience itching with your medications, try taking only a single pain pill, or even half a pain pill at a time.  You can also use Benadryl over the counter for itching or also to help with sleep.   TED HOSE STOCKINGS:  Use stockings on both legs until for at least 2 weeks or as directed by physician office. They may be removed at night for sleeping.  MEDICATIONS:  See your medication summary on the "After Visit Summary" that nursing will review with you.  You may have some home medications which will be placed on hold until you complete the course of blood thinner medication.  It is important for you to complete the blood thinner medication as prescribed.  PRECAUTIONS:  If you experience chest pain or shortness of breath - call 911 immediately for transfer to the hospital emergency department.   If you develop a fever greater that 101 F, purulent drainage from wound, increased redness or drainage from wound, foul odor from the wound/dressing, or calf pain - CONTACT YOUR SURGEON.                                                   FOLLOW-UP APPOINTMENTS:  If you do not already have a post-op appointment, please call the office for an appointment to be seen by your surgeon.  Guidelines for how soon to be seen are listed in your "After Visit Summary", but are typically between 1-4 weeks after surgery.  OTHER  INSTRUCTIONS:   Knee Replacement:  Do not place pillow under knee, focus on keeping the knee straight while resting. CPM instructions: 0-90 degrees, 2 hours in the morning, 2 hours in the afternoon, and 2 hours in the evening. Place foam block, curve side up under heel at all times except when in CPM or when walking.  DO NOT modify, tear, cut, or change the foam block in any way.  MAKE SURE YOU:  Understand these instructions.  Get help right away if you are not doing well or get worse.    Thank you for letting us be a part of your medical care team.  It is a privilege we respect greatly.  We hope these instructions will help you stay on track for a fast and full recovery!   Driving restrictions   Complete by: As directed    No driving for 3 weeks   Increase activity slowly as tolerated   Complete by: As directed    Lifting restrictions   Complete by: As directed    No lifting for 6 weeks      Follow-up Information    Kathryne Hitch, MD Follow up in 2 week(s).   Specialty: Orthopedic Surgery Contact information: [address] [place] [address] [phone]            Signed: Kathryne Hitch 09/25/2019, 3:43 PM

## 2019-10-03 ENCOUNTER — Ambulatory Visit (INDEPENDENT_AMBULATORY_CARE_PROVIDER_SITE_OTHER): Payer: Medicare Other

## 2019-10-03 ENCOUNTER — Other Ambulatory Visit: Payer: Self-pay

## 2019-10-03 DIAGNOSIS — Z1231 Encounter for screening mammogram for malignant neoplasm of breast: Secondary | ICD-10-CM | POA: Diagnosis not present

## 2019-10-08 ENCOUNTER — Encounter: Payer: Self-pay | Admitting: Orthopaedic Surgery

## 2019-10-08 ENCOUNTER — Other Ambulatory Visit: Payer: Self-pay

## 2019-10-08 ENCOUNTER — Ambulatory Visit (INDEPENDENT_AMBULATORY_CARE_PROVIDER_SITE_OTHER): Payer: Medicare Other | Admitting: Orthopaedic Surgery

## 2019-10-08 DIAGNOSIS — Z96641 Presence of right artificial hip joint: Secondary | ICD-10-CM

## 2019-10-08 DIAGNOSIS — Z96642 Presence of left artificial hip joint: Secondary | ICD-10-CM

## 2019-10-08 NOTE — Progress Notes (Signed)
The patient is now 2 weeks status post a right total hip arthroplasty in 10 months status post a left total hip arthroplasty.  She says she is doing well overall.  She is not walking with any type of assistive ice.  She is very active 69 year old female.  She is taking aspirin twice a day and just Tylenol for pain.  On examination her incision looks good.  I did place some new Steri-Strips.  There is no significant seroma.  Her leg lengths are equal.  She is wearing TED hose on her left ankle and leg.  Her calf is soft.  She says she is not having any swelling.  At this point she can go down an aspirin once a day for a week and then can stop aspirin after that.  She can stop wearing her TED hose if she is not having swelling.  She will slowly get back to golf as comfort allows.  We will see her back in 4 weeks for repeat exam but no x-rays are needed.  If she looks good at that visit we do not need to see her back after that for 6 months.

## 2019-11-05 ENCOUNTER — Encounter: Payer: Self-pay | Admitting: Orthopaedic Surgery

## 2019-11-05 ENCOUNTER — Ambulatory Visit (INDEPENDENT_AMBULATORY_CARE_PROVIDER_SITE_OTHER): Payer: Medicare Other | Admitting: Orthopaedic Surgery

## 2019-11-05 DIAGNOSIS — Z96641 Presence of right artificial hip joint: Secondary | ICD-10-CM

## 2019-11-05 NOTE — Progress Notes (Signed)
The patient is now 6 weeks to 7 weeks status post a right total hip arthroplasty.  She is about 2 months out from her left total hip.  She is doing well overall.  She is a very active 68 year old female.  She is walking without an assistive device.  She still reports some stiffness on her left hip and says the right hip is done better thus far.  On exam both hips move fluidly and smoothly.  She walks without a limp.  Her leg lengths are equal.  At this point I gave her reassurance that she should continue to improve.  I would like to see her back in 6 months with a standing low AP pelvis and lateral of both hips.

## 2020-01-19 ENCOUNTER — Ambulatory Visit: Payer: Medicare PPO | Attending: Internal Medicine

## 2020-01-19 DIAGNOSIS — Z23 Encounter for immunization: Secondary | ICD-10-CM

## 2020-02-01 ENCOUNTER — Encounter: Payer: Self-pay | Admitting: Orthopaedic Surgery

## 2020-02-01 ENCOUNTER — Telehealth: Payer: Self-pay | Admitting: Orthopaedic Surgery

## 2020-02-01 ENCOUNTER — Other Ambulatory Visit: Payer: Self-pay

## 2020-02-01 DIAGNOSIS — M25552 Pain in left hip: Secondary | ICD-10-CM

## 2020-02-01 DIAGNOSIS — Z96642 Presence of left artificial hip joint: Secondary | ICD-10-CM

## 2020-02-01 NOTE — Telephone Encounter (Signed)
Patient called.   She would like her PT referral be sent to [facility] in [place]. It's closer to her.  Call back number: [phone]

## 2020-02-04 NOTE — Telephone Encounter (Signed)
Done. Patient notified.

## 2020-02-04 NOTE — Telephone Encounter (Signed)
Can we change this?

## 2020-02-04 NOTE — Progress Notes (Unsigned)
Subjective:   Tanya Cortez is a 69 y.o. female who presents for an Initial Medicare Annual Wellness Visit.  Review of Systems    No ROS.  Medicare Wellness Virtual Visit.  Visual/audio telehealth visit, UTA vital signs.   See social history for additional risk factors.     Cardiac Risk Factors include: advanced age (>42men, >14 women);hypertension Sleep patterns: Getting 8-9 hours of sleep a night. Does not wake up to void during the night. Wakes up and feels rested and ready for the day.   Home Safety/Smoke Alarms: Feels safe in home. Smoke alarms in place.  Living environment; Lives with friend in 2 story home and stairs have hand rails on them and elevator as well. Shower is a walk in shower and grab bars in place. Seat Belt Safety/Bike Helmet: Wears seat belt.   Female:   Pap- Aged out      Mammo-  UTD      Dexa scan- UTD       CCS- UTD     Objective:    Today's Vitals   02/11/20 0915  BP: 137/78  Pulse: 92  SpO2: 100%  Weight: 138 lb (62.6 kg)  Height: 5\' 4"  (1.626 m)   Body mass index is 23.69 kg/m.  Advanced Directives 02/11/2020 09/21/2019 09/18/2019 12/15/2018 12/08/2018 04/18/2018 05/08/2014  Does Patient Have a Medical Advance Directive? Yes Yes Yes No No No Patient does not have advance directive;Patient would like information  Type of Advance Directive Healthcare Power of [place];Living will Healthcare Power of [place];Living will Healthcare Power of [place];Living will - - - -  Does patient want to make changes to medical advance directive? No - Patient declined No - Patient declined No - Patient declined - - - -  Copy of Healthcare Power of Attorney in Chart? No - copy requested No - copy requested No - copy requested - - - -  Would patient like information on creating a medical advance directive? - - - No - Patient declined No - Patient declined Yes (MAU/Ambulatory/Procedural Areas - Information given) Advance directive brochure given (Outpatient ONLY)     Current Medications (verified) Outpatient Encounter Medications as of 02/11/2020  Medication Sig  . alendronate (FOSAMAX) 70 MG tablet TAKE 1 TABLET BY MOUTH EVERY 7 DAYS. TAKE  WITH  A  FULL  GLASS  OF  WATER  ON  AN  EMPTY  STOMACH. (Patient taking differently: Take 70 mg by mouth once a week. Fridays)  . atorvastatin (LIPITOR) 40 MG tablet Take 1 tablet by mouth once daily  . Calcium Carbonate-Vit D-Min (CALTRATE 600+D PLUS MINERALS) 600-800 MG-UNIT TABS Take 1 tablet by mouth 2 (two) times daily.  05-16-1991 aspirin 81 MG chewable tablet Chew 1 tablet (81 mg total) by mouth 2 (two) times daily. (Patient not taking: Reported on 02/05/2020)  . HYDROcodone-acetaminophen (NORCO) 5-325 MG tablet Take 1-2 tablets by mouth every 6 (six) hours as needed for moderate pain. (Patient not taking: Reported on 02/05/2020)  . methocarbamol (ROBAXIN) 500 MG tablet Take 1 tablet (500 mg total) by mouth every 6 (six) hours as needed for muscle spasms. (Patient not taking: Reported on 02/05/2020)   No facility-administered encounter medications on file as of 02/11/2020.    Allergies (verified) Patient has no known allergies.   History: Past Medical History:  Diagnosis Date  . Degenerative joint disease (DJD) of hip 06/13/2018  . Essential hypertension, benign 04/22/2014  . History of hip replacement    right  .  Hyperlipidemia 04/22/2014  . Osteopenia determined by x-ray 04/20/2018   T score -2.1 on Dexa scan 2017   Past Surgical History:  Procedure Laterality Date  . TONSILLECTOMY    . TOTAL HIP ARTHROPLASTY Left 12/15/2018   Procedure: LEFT TOTAL HIP ARTHROPLASTY ANTERIOR APPROACH;  Surgeon: Kathryne Hitch, MD;  Location: [facility];  Service: Orthopedics;  Laterality: Left;  . TOTAL HIP ARTHROPLASTY Right 09/21/2019   Procedure: RIGHT TOTAL HIP ARTHROPLASTY ANTERIOR APPROACH;  Surgeon: Kathryne Hitch, MD;  Location: [facility];  Service: Orthopedics;  Laterality: Right;   Family History  Problem  Relation Age of Onset  . Skin cancer Mother        squamous cell   . Hypertension Mother   . Breast cancer Mother   . COPD Mother   . Heart attack Maternal Grandfather   . Stroke Maternal Grandfather   . Heart attack Father        died 39 yo  . Hypertension Sister    Social History   Socioeconomic History  . Marital status: Single    Spouse name: Not on file  . Number of children: Not on file  . Years of education: 4  . Highest education level: Bachelor's degree (e.g., BA, AB, BS)  Occupational History  . Occupation: high school principal    Comment: [employer]  Tobacco Use  . Smoking status: Never Smoker  . Smokeless tobacco: Never Used  Substance and Sexual Activity  . Alcohol use: No  . Drug use: No  . Sexual activity: Never  Other Topics Concern  . Not on file  Social History Narrative   Patient reports exercising regularly by doing 30 -45 mins daily of cardio. 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  She has a degree in education specialty.  Lives with [name].  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 Difficulty of Paying Living Expenses: Not on file  Food Insecurity:   . Worried About Programme researcher, broadcasting/film/video in the Last Year: Not on file  . Ran Out of Food in the Last Year: Not on file  Transportation Needs:   . Lack of Transportation (Medical): Not on file  . Lack of Transportation (Non-Medical): Not on file  Physical Activity:   . Days of Exercise per Week: Not on file  . Minutes of Exercise per Session: Not on file  Stress:   . Feeling of Stress : Not on file  Social Connections:   . Frequency of Communication with Friends and Family: Not on file  . Frequency of Social Gatherings with Friends and Family: Not on file  . Attends Religious Services: Not on file  . Active Member of Clubs or Organizations: Not on file  . Attends Banker Meetings: Not on file   . Marital Status: Not on file    Tobacco Counseling Counseling given: Not Answered   Clinical Intake:  Pre-visit preparation completed: Yes  Pain : No/denies pain     Nutritional Risks: None Diabetes: No  How often do you need to have someone help you when you read instructions, pamphlets, or other written materials from your doctor or pharmacy?: 1 - Never What is the last grade level you completed in school?: 16  Interpreter Needed?: No  Information entered by :: Carolin Sicks, LPN   Activities of Daily Living In your present state of health, do you have  any difficulty performing the following activities: 02/11/2020 09/21/2019  Hearing? N N  Vision? N N  Difficulty concentrating or making decisions? N N  Walking or climbing stairs? N N  Dressing or bathing? N N  Doing errands, shopping? N N  Preparing Food and eating ? N -  Using the Toilet? N -  In the past six months, have you accidently leaked urine? N -  Do you have problems with loss of bowel control? N -  Managing your Medications? N -  Managing your Finances? N -  Housekeeping or managing your Housekeeping? N -  Some recent data might be hidden     Immunizations and Health Maintenance Immunization History  Administered Date(s) Administered  . Fluad Quad(high Dose 65+) 08/14/2019  . Influenza, High Dose Seasonal PF 09/10/2018  . Influenza,inj,Quad PF,6+ Mos 12/17/2014, 12/15/2015  . Influenza-Unspecified 09/21/2016, 12/10/2017  . PFIZER SARS-COV-2 Vaccination 01/19/2020, 02/09/2020  . Pneumococcal Conjugate-13 06/12/2015  . Pneumococcal Polysaccharide-23 08/10/2016  . Tdap 05/08/2014, 09/21/2016  . Zoster 05/08/2014  . Zoster Recombinat (Shingrix) 01/06/2018   There are no preventive care reminders to display for this patient.  Patient Care Team: Agapito Games, MD as PCP - General (Family Medicine)  Indicate any recent Medical Services you may have received from other than [facility] providers in  the past year (date may be approximate).     Assessment:   This is a routine wellness examination for [place].Physical assessment deferred to PCP.   Hearing/Vision screen  Hearing Screening   125Hz  250Hz  500Hz  1000Hz  2000Hz  3000Hz  4000Hz  6000Hz  8000Hz   Right ear:           Left ear:           Comments: Hearing test not performed due to mask on due to COVID   Visual Acuity Screening   Right eye Left eye Both eyes  Without correction:     With correction: 20/20 20/50 20/20     Dietary issues and exercise activities discussed: Current Exercise Habits: Home exercise routine, Type of exercise: treadmill(elliptical), Time (Minutes): 20(plays golf as well walks nine holes), Frequency (Times/Week): 7, Weekly Exercise (Minutes/Week): 140, Intensity: Moderate Diet Eats a healthy diet of vegetables fruits and proteins. Breakfast:banana and coffee Lunch: protein bar and water Dinner:  Meat and vegetables Drinks 6 bottles of water daily.Drinks protein shake daily.   Goals    . Patient Stated     Patient stated get rid of discomfort in left hip from surgery and doing PT.       Depression Screen PHQ 2/9 Scores 02/11/2020 08/08/2019 08/01/2018 02/01/2018 08/02/2017 06/23/2016 06/12/2015  PHQ - 2 Score 0 0 0 0 0 0 0    Fall Risk Fall Risk  02/11/2020 08/08/2019 08/01/2018 02/01/2018 08/02/2017  Falls in the past year? 0 0 No No No  Number falls in past yr: - 0 - - -  Injury with Fall? - 0 - - -  Risk for fall due to : No Fall Risks - - - -  Follow up Falls prevention discussed - - - -    Is the patient's home free of loose throw rugs in walkways, pet beds, electrical cords, etc?   yes      Grab bars in the bathroom? yes      Handrails on the stairs?   yes      Adequate lighting?   yes  Cognitive Function:     6CIT Screen 02/11/2020  What Year? 4 points  What month? 0  points  What time? 0 points  Count back from 20 0 points  Months in reverse 0 points  Repeat phrase 0 points  Total Score 4     Screening Tests Health Maintenance  Topic Date Due  . MAMMOGRAM  10/02/2021  . Fecal DNA (Cologuard)  08/26/2022  . TETANUS/TDAP  09/21/2026  . INFLUENZA VACCINE  Completed  . DEXA SCAN  Completed  . Hepatitis C Screening  Completed  . PNA vac Low Risk Adult  Completed     Plan:  Please schedule your next medicare wellness visit with me in 1 yr.  Ms. Wagenaar , Thank you for taking time to come for your Medicare Wellness Visit. I appreciate your ongoing commitment to your health goals. Please review the following plan we discussed and let me know if I can assist you in the future.  Continue doing brain stimulating activities (puzzles, reading, adult coloring books, staying active) to keep memory sharp.  Bring a copy of your living will and/or healthcare power of attorney to your next office visit.   These are the goals we discussed: Goals    . Patient Stated     Patient stated get rid of discomfort in left hip from surgery and doing PT.        This is a list of the screening recommended for you and due dates:  Health Maintenance  Topic Date Due  . Mammogram  10/02/2021  . Cologuard (Stool DNA test)  08/26/2022  . Tetanus Vaccine  09/21/2026  . Flu Shot  Completed  . DEXA scan (bone density measurement)  Completed  .  Hepatitis C: One time screening is recommended by Center for Disease Control  (CDC) for  adults born from [birth year] through 1966.   Completed  . Pneumonia vaccines  Completed     I have personally reviewed and noted the following in the patient's chart:   . Medical and social history . Use of alcohol, tobacco or illicit drugs  . Current medications and supplements . Functional ability and status . Nutritional status . Physical activity . Advanced directives . List of other physicians . Hospitalizations, surgeries, and ER visits in previous 12 months . Vitals . Screenings to include cognitive, depression, and falls . Referrals and appointments  In  addition, I have reviewed and discussed with patient certain preventive protocols, quality metrics, and best practice recommendations. A written personalized care plan for preventive services as well as general preventive health recommendations were provided to patient.     Joanne Chars, LPN   05/23/7823

## 2020-02-05 ENCOUNTER — Ambulatory Visit: Payer: Medicare PPO | Admitting: Rehabilitative and Restorative Service Providers"

## 2020-02-05 ENCOUNTER — Other Ambulatory Visit: Payer: Self-pay

## 2020-02-05 DIAGNOSIS — M6281 Muscle weakness (generalized): Secondary | ICD-10-CM | POA: Diagnosis not present

## 2020-02-05 DIAGNOSIS — M25652 Stiffness of left hip, not elsewhere classified: Secondary | ICD-10-CM

## 2020-02-05 DIAGNOSIS — M25552 Pain in left hip: Secondary | ICD-10-CM | POA: Diagnosis not present

## 2020-02-05 NOTE — Therapy (Signed)
[facility] [place] [address] [place], [place], [address] Phone: [phone]   Fax:  [phone]  Physical Therapy Evaluation  Patient Details  Name: Tanya Cortez MRN: [id number] Date of Birth: 1951-03-07 Referring Provider (PT): Doneen Poisson, MD   Encounter Date: 02/05/2020  PT End of Session - 02/05/20 1258    Visit Number  1    Number of Visits  8    Date for PT Re-Evaluation  03/18/20    Authorization Type  humana    PT Start Time  1150    PT Stop Time  1230    PT Time Calculation (min)  40 min       Past Medical History:  Diagnosis Date  . Degenerative joint disease (DJD) of hip 06/13/2018  . Essential hypertension, benign 04/22/2014  . Hyperlipidemia 04/22/2014  . Osteopenia determined by x-ray 04/20/2018   T score -2.1 on Dexa scan 2017    Past Surgical History:  Procedure Laterality Date  . TONSILLECTOMY    . TOTAL HIP ARTHROPLASTY Left 12/15/2018   Procedure: LEFT TOTAL HIP ARTHROPLASTY ANTERIOR APPROACH;  Surgeon: Kathryne Hitch, MD;  Location: [facility];  Service: Orthopedics;  Laterality: Left;  . TOTAL HIP ARTHROPLASTY Right 09/21/2019   Procedure: RIGHT TOTAL HIP ARTHROPLASTY ANTERIOR APPROACH;  Surgeon: Kathryne Hitch, MD;  Location: [facility];  Service: Orthopedics;  Laterality: Right;    There were no vitals filed for this visit.   Subjective Assessment - 02/05/20 1152    Subjective  The patient reports she had L hip replacement (anterior approach)    The patient has seem a massage therapist, has worked out with a Psychologist, educational.  [place] it is not a hurt, it is not discomfort.  She rides a recumbent bike and works out daily.    The patient reports she had the most difficulty at night and felt like the left leg would go numb and she has to readjust.  The TENS unit seems to help.         [facility] PT Assessment - 02/05/20 1247      Assessment   Medical Diagnosis  L hip pain    Referring Provider (PT)  Doneen Poisson, MD    Onset Date/Surgical Date  12/15/18    Hand Dominance  Right    Prior Therapy  known to our clinic from prior therapy for hip      Precautions   Precautions  Anterior Hip   right 09/21/19 and left 12/15/2018     Restrictions   Weight Bearing Restrictions  No      Balance Screen   Has the patient fallen in the past 6 months  No    Has the patient had a decrease in activity level because of a fear of falling?   No    Is the patient reluctant to leave their home because of a fear of falling?   No      Home Public house manager residence    Additional Comments  independent with all household and community activities      Prior Function   Level of Independence  Independent    Vocation  Retired    Midwife, working out, daily exercise (recumbent bike)      Observation/Other Assessments   Focus on Therapeutic Outcomes (FOTO)   99% (1% limitation)      Sensation   Light Touch  Appears Intact  Posture/Postural Control   Posture/Postural Control  No significant limitations      ROM / Strength   AROM / PROM / Strength  AROM;Strength      AROM   Overall AROM   Within functional limits for tasks performed      Strength   Overall Strength  Deficits    Strength Assessment Site  Hip    Right/Left Hip  Right;Left    Right Hip Flexion  5/5    Right Hip Extension  4+/5    Right Hip ABduction  4+/5    Left Hip Flexion  5/5    Left Hip Extension  4+/5    Left Hip ABduction  4+/5      Flexibility   Soft Tissue Assessment /Muscle Length  yes   tight L hip flexor   Hamstrings  WNL bilat    ITB  tightness noted L side with palpable tenderness proximal IT Band and medial hamstring      Palpation   Palpation comment  Tender to palpation L IT Band,       Ambulation/Gait   Ambulation/Gait  Yes    Ambulation/Gait Assistance  7: Independent    Ambulation Distance (Feet)  100 Feet    Assistive device   None    Gait Pattern  Step-through pattern;Within Functional Limits   mild femoral IR noted bilat with gait R > L   Stairs  Yes    Stairs Assistance  7: Independent    Stair Management Technique  Alternating pattern;No rails                Objective measurements completed on examination: See above findings.      [facility] Adult PT Treatment/Exercise - 02/05/20 1247      Exercises   Exercises  Knee/Hip      Knee/Hip Exercises: Stretches   Hip Flexor Stretch  1 rep;Left;30 seconds    ITB Stretch  1 rep;30 seconds;Left    Other Knee/Hip Stretches  hip adductor stretch 2 reps in standing x 30 seconds bilat      Knee/Hip Exercises: Sidelying   Hip ABduction  Strengthening;Left;10 reps    Hip ABduction Limitations  cues for technique    Other Sidelying Knee/Hip Exercises  reverse clam with L hip abducted x 10 reps,       Knee/Hip Exercises: Prone   Hip Extension  Strengthening;Left;10 reps             PT Education - 02/05/20 1257    Education Details  HEP established    Person(s) Educated  Patient    Methods  Explanation;Demonstration;Handout    Comprehension  Verbalized understanding;Returned demonstration          PT Long Term Goals - 02/05/20 1258      PT LONG TERM GOAL #1   Title  The patient will be indep with HEP for hip strengthening, stretching and self mobilization    Time  6    Period  Weeks    Target Date  03/18/20      PT LONG TERM GOAL #2   Title  The patient will improve L hip abduction and extensor strength to 5/5.    Time  6    Period  Weeks    Target Date  03/18/20      PT LONG TERM GOAL #3   Title  The patient will report no discomfort with daily activities and recreational activities.    Time  6  Period  Weeks    Target Date  03/18/20             Plan - 02/05/20 1300    Clinical Impression Statement  The patient is a 69 year old female presenting to physical therapy with h/o L hip replacement 12/15/2018 and R hip  replacement 09/21/19.  She notes ongoing "discomfort" in the L lateral hip.  She presents with impairments of mild pain L lateral hip, LE weakness with hip abduction and extension bilatrally, minimal femoral IR with gait, tightness L IT Band, pain with palpation L IT and glut medius region.  The patient is able to perform all functional and recreational activities, however she is aware of discomfort in L hip during all tasks.  PT to address deficits to reduce hip discomfort.    Personal Factors and Comorbidities  Comorbidity 1;Comorbidity 2    Comorbidities  osteopenia, h/o bilat anterior THA    Stability/Clinical Decision Making  Stable/Uncomplicated    Clinical Decision Making  Low    Rehab Potential  Good    PT Frequency  --   2x/week for 2 weeks, 1x/week for 4 weeks   PT Treatment/Interventions  ADLs/Self Care Home Management;Gait training;Stair training;Functional mobility training;Therapeutic activities;Therapeutic exercise;Manual techniques;Dry needling;Taping;Electrical Stimulation;Cryotherapy;Iontophoresis 4mg /ml Dexamethasone;Moist Heat;Patient/family education    PT Next Visit Plan  Dry needling (point tender L IT Band proximal posterior near HS), deep hip muscles as appropriate (pain with palpation posterior aspect of femur), glut medius strengthening correcting form, hip stabilization SLS    PT Home Exercise Plan  Access Code: [email or web address]    Consulted and Agree with Plan of Care  Patient       Patient will benefit from skilled therapeutic intervention in order to improve the following deficits and impairments:  Decreased strength, Impaired flexibility, Pain, Increased fascial restricitons  Visit Diagnosis: Muscle weakness (generalized)  Pain in left hip  Stiffness of left hip, not elsewhere classified     Problem List Patient Active Problem List   Diagnosis Date Noted  . 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 Unilateral primary osteoarthritis, right hip  08/21/2019  . Unilateral primary osteoarthritis, left hip 12/15/2018  . Status post total replacement of left hip 12/15/2018  . Degenerative joint disease (DJD) of hip 06/13/2018  . Osteopenia determined by x-ray 04/20/2018  . Tendonitis involving hip abductors 04/20/2018  . Heart murmur 05/24/2014  . Hyperlipidemia 04/22/2014  . Essential hypertension, benign 04/22/2014    Elgin Carn, PT 02/05/2020, 1:09 PM  [facility] [place], [place], [address] Phone: [phone]   Fax:  [phone]  Name: SAHASRA BELUE MRN: [id number] Date of Birth: 12-25-1951

## 2020-02-05 NOTE — Patient Instructions (Signed)
Access Code: [email or web address]  URL: [email or web address]  Date: 02/05/2020  Prepared by: Margretta Ditty   Exercises Prone Hip Extension - 10 reps - 1 sets - 2x daily - 7x weekly Sidelying Hip Abduction - 10 reps - 1 sets - 2x daily - 7x weekly Reverse Clamshell in Extension and Abduction - 10 reps - 1 sets - 2x daily - 7x weekly Foam Roller with ITB Release with Wrap - 1 reps - 1 sets - 2x daily                            - 7x weekly Thomas Stretch on Table - 3 reps - 1 sets - 30 minutes hold - 2x daily - 7x weekly Sidelying TFL Stretch - 3 reps - 1 sets - 30 seconds hold - 2x daily                            - 7x weekly Side Lunge Adductor Stretch - 3 reps - 1 sets - 30 seconds hold - 2x daily - 7x weekly

## 2020-02-07 ENCOUNTER — Encounter: Payer: Self-pay | Admitting: Physical Therapy

## 2020-02-07 ENCOUNTER — Ambulatory Visit: Payer: Medicare PPO | Admitting: Physical Therapy

## 2020-02-07 DIAGNOSIS — M6281 Muscle weakness (generalized): Secondary | ICD-10-CM | POA: Diagnosis not present

## 2020-02-07 DIAGNOSIS — M25652 Stiffness of left hip, not elsewhere classified: Secondary | ICD-10-CM | POA: Diagnosis not present

## 2020-02-07 DIAGNOSIS — M25552 Pain in left hip: Secondary | ICD-10-CM | POA: Diagnosis not present

## 2020-02-07 NOTE — Patient Instructions (Signed)
Access Code: [email or web address]  URL: [email or web address]  Date: 02/07/2020  Prepared by: Raynelle Fanning Chanti Golubski   Exercises Prone Hip Extension - 10 reps - 1 sets - 2x daily - 7x weekly Sidelying Hip Abduction - 10 reps - 1 sets - 2x daily - 7x weekly Reverse Clamshell in Extension and Abduction - 10 reps - 1 sets - 2x daily - 7x weekly Foam Roller with ITB Release with Wrap - 1 reps - 1 sets - 2x daily                            - 7x weekly Thomas Stretch on Table - 3 reps - 1 sets - 30 minutes hold - 2x daily - 7x weekly Sidelying TFL Stretch - 3 reps - 1 sets - 30 seconds hold - 2x daily                            - 7x weekly Side Lunge Adductor Stretch - 3 reps - 1 sets - 30 seconds hold - 2x daily - 7x weekly Patient Education Trigger Point Dry Needling

## 2020-02-07 NOTE — Therapy (Signed)
[facility] [place] [address] [place] [address] [place], [place], [address] Phone: [phone]   Fax:  [phone]  Physical Therapy Treatment  Patient Details  Name: Tanya Cortez MRN: [id number] Date of Birth: [birth year] Referring Provider (PT): Doneen Poisson, MD   Encounter Date: 02/07/2020  PT End of Session - 02/07/20 0759    Visit Number  2    Number of Visits  8    Date for PT Re-Evaluation  03/18/20    Authorization Type  humana    PT Start Time  0800    PT Stop Time  0852    PT Time Calculation (min)  52 min    Activity Tolerance  Patient tolerated treatment well    Behavior During Therapy  [facility] for tasks assessed/performed       Past Medical History:  Diagnosis Date  . Degenerative joint disease (DJD) of hip 06/13/2018  . Essential hypertension, benign 04/22/2014  . Hyperlipidemia 04/22/2014  . Osteopenia determined by x-ray 04/20/2018   T score -2.1 on Dexa scan 2017    Past Surgical History:  Procedure Laterality Date  . TONSILLECTOMY    . TOTAL HIP ARTHROPLASTY Left 12/15/2018   Procedure: LEFT TOTAL HIP ARTHROPLASTY ANTERIOR APPROACH;  Surgeon: Kathryne Hitch, MD;  Location: [facility];  Service: Orthopedics;  Laterality: Left;  . TOTAL HIP ARTHROPLASTY Right 09/21/2019   Procedure: RIGHT TOTAL HIP ARTHROPLASTY ANTERIOR APPROACH;  Surgeon: Kathryne Hitch, MD;  Location: [facility];  Service: Orthopedics;  Laterality: Right;    There were no vitals filed for this visit.  Subjective Assessment - 02/07/20 0808    Subjective  Patient rates discomfort 2/10.    Currently in Pain?  Other (Comment)    Pain Score  2     Pain Location  Hip    Pain Orientation  Left    Pain Descriptors / Indicators  Discomfort                       [facility] Adult PT Treatment/Exercise - 02/07/20 0001      Knee/Hip Exercises: Stretches   Hip Flexor Stretch  Both;1 rep;60 seconds    ITB Stretch  Both;1 rep;60  seconds    ITB Stretch Limitations  sideying      Knee/Hip Exercises: Sidelying   Hip ABduction  Strengthening;Both;10 reps    Hip ABduction Limitations  cues for technique    Other Sidelying Knee/Hip Exercises  reverse clam with bil hip abducted x 10 reps,       Knee/Hip Exercises: Prone   Hip Extension  Both;10 reps      Modalities   Modalities  Moist Heat      Moist Heat Therapy   Number Minutes Moist Heat  10 Minutes    Moist Heat Location  Other (comment);Hip   quads     Manual Therapy   Manual Therapy  Soft tissue mobilization;Myofascial release    Manual therapy comments  Skilled palpation and monitoring of soft tissues during DN     Soft tissue mobilization  IASTM to left quads and proximal left hip, and ITB    Myofascial Release  TPR to left psoas in hooklying and with active hip flexion x 10       Trigger Point Dry Needling - 02/07/20 0001    Consent Given?  Yes    Education Handout Provided  Yes    Muscles Treated Lower Quadrant  Adductor longus/brevis/magnus;Rectus femoris;Vastus lateralis;Vastus intermedius;Hamstring  Muscles Treated Back/Hip  Tensor fascia lata    Dry Needling Comments  left    Adductor Response  Palpable increased muscle length;Twitch response elicited    Rectus femoris Response  Twitch response elicited;Palpable increased muscle length    Vastus lateralis Response  Twitch response elicited;Palpable increased muscle length    Vastus intermedius Response  Twitch response elicited;Palpable increased muscle length    Hamstring Response  Palpable increased muscle length    Tensor Fascia Lata Response  Twitch response elicited;Palpable increased muscle length           PT Education - 02/07/20 1329    Education Details  DN education and aftercare    Person(s) Educated  Patient    Methods  Explanation;Handout    Comprehension  Verbalized understanding          PT Long Term Goals - 02/05/20 1258      PT LONG TERM GOAL #1   Title   The patient will be indep with HEP for hip strengthening, stretching and self mobilization    Time  6    Period  Weeks    Target Date  03/18/20      PT LONG TERM GOAL #2   Title  The patient will improve L hip abduction and extensor strength to 5/5.    Time  6    Period  Weeks    Target Date  03/18/20      PT LONG TERM GOAL #3   Title  The patient will report no discomfort with daily activities and recreational activities.    Time  6    Period  Weeks    Target Date  03/18/20            Plan - 02/07/20 1329    Clinical Impression Statement  HEP was reviewed and performed (except Hip adductor stretch). Patient requring VCs for correct form. She responded very well to DN in left quads, TFL and adductors. She may benefit from DN to psoas as well.    Comorbidities  osteopenia, h/o bilat anterior THA    PT Treatment/Interventions  ADLs/Self Care Home Management;Gait training;Stair training;Functional mobility training;Therapeutic activities;Therapeutic exercise;Manual techniques;Dry needling;Taping;Electrical Stimulation;Cryotherapy;Iontophoresis 4mg /ml Dexamethasone;Moist Heat;Patient/family education    PT Next Visit Plan  Assess Dry needling. May need more to psoas and deep hip muscles as appropriate (pain with palpation posterior aspect of femur), glut medius strengthening correcting form, hip stabilization SLS    PT Home Exercise Plan  Access Code: [email or web address]       Patient will benefit from skilled therapeutic intervention in order to improve the following deficits and impairments:  Decreased strength, Impaired flexibility, Pain, Increased fascial restricitons  Visit Diagnosis: Pain in left hip  Muscle weakness (generalized)  Stiffness of left hip, not elsewhere classified     Problem List Patient Active Problem List   Diagnosis Date Noted  . 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 Unilateral primary osteoarthritis, right hip 08/21/2019  . Unilateral  primary osteoarthritis, left hip 12/15/2018  . Status post total replacement of left hip 12/15/2018  . Degenerative joint disease (DJD) of hip 06/13/2018  . Osteopenia determined by x-ray 04/20/2018  . Tendonitis involving hip abductors 04/20/2018  . Heart murmur 05/24/2014  . Hyperlipidemia 04/22/2014  . Essential hypertension, benign 04/22/2014    Madelyn Flavors PT 02/07/2020, 1:32 PM  [facility] [place], [place], [address] Phone: [phone]   Fax:  [phone]  Name: Tanya Cortez MRN: [id number] Date of Birth: 10-31-1951

## 2020-02-09 ENCOUNTER — Ambulatory Visit: Payer: Medicare PPO | Attending: Internal Medicine

## 2020-02-09 DIAGNOSIS — Z23 Encounter for immunization: Secondary | ICD-10-CM

## 2020-02-09 NOTE — Progress Notes (Signed)
   Covid-19 Vaccination Clinic  Name:  SEMIYAH NEWGENT    MRN: [id number] DOB: 1951-08-18  02/09/2020  Ms. Alegria was observed post Covid-19 immunization for 15 minutes without incidence. She was provided with Vaccine Information Sheet and instruction to access the V-Safe system.   Ms. Sakamoto was instructed to call 911 with any severe reactions post vaccine: Marland Kitchen Difficulty breathing  . Swelling of your face and throat  . A fast heartbeat  . A bad rash all over your body  . Dizziness and weakness    Immunizations Administered    Name Date Dose VIS Date Route   Pfizer COVID-19 Vaccine 02/09/2020 10:34 AM 0.3 mL 12/07/2019 Intramuscular   Manufacturer: [employer], [employer]   Lot: [id number]   NDC: [id number]

## 2020-02-11 ENCOUNTER — Other Ambulatory Visit: Payer: Self-pay

## 2020-02-11 ENCOUNTER — Encounter: Payer: Self-pay | Admitting: Family Medicine

## 2020-02-11 ENCOUNTER — Ambulatory Visit: Payer: Medicare PPO | Admitting: *Deleted

## 2020-02-11 VITALS — BP 137/78 | HR 92 | Ht 64.0 in | Wt 138.0 lb

## 2020-02-11 DIAGNOSIS — D649 Anemia, unspecified: Secondary | ICD-10-CM

## 2020-02-11 DIAGNOSIS — Z0000 Encounter for general adult medical examination without abnormal findings: Secondary | ICD-10-CM

## 2020-02-11 DIAGNOSIS — I10 Essential (primary) hypertension: Secondary | ICD-10-CM

## 2020-02-11 NOTE — Patient Instructions (Signed)
Please schedule your next medicare wellness visit with me in 1 yr.  [name] , Thank you for taking time to come for your Medicare Wellness Visit. I appreciate your ongoing commitment to your health goals. Please review the following plan we discussed and let me know if I can assist you in the future.  Continue doing brain stimulating activities (puzzles, reading, adult coloring books, staying active) to keep memory sharp.  Bring a copy of your living will and/or healthcare power of attorney to your next office visit. These are the goals we discussed: Goals    . Patient Stated     Patient stated get rid of discomfort in left hip from surgery and doing PT.

## 2020-02-11 NOTE — Telephone Encounter (Signed)
CMP pended, anything else you would like?

## 2020-02-11 NOTE — Telephone Encounter (Signed)
Added CBC

## 2020-02-12 ENCOUNTER — Ambulatory Visit: Payer: Medicare PPO | Admitting: Physical Therapy

## 2020-02-12 ENCOUNTER — Encounter: Payer: Self-pay | Admitting: Physical Therapy

## 2020-02-12 DIAGNOSIS — M25552 Pain in left hip: Secondary | ICD-10-CM

## 2020-02-12 DIAGNOSIS — M6281 Muscle weakness (generalized): Secondary | ICD-10-CM

## 2020-02-12 DIAGNOSIS — M25652 Stiffness of left hip, not elsewhere classified: Secondary | ICD-10-CM | POA: Diagnosis not present

## 2020-02-12 NOTE — Therapy (Signed)
[facility] [place] [address] [place], [place], [address] Phone: [phone]   Fax:  [phone]  Physical Therapy Treatment  Patient Details  Name: Tanya Cortez MRN: [id number] Date of Birth: 19-Mar-1951 Referring Provider (PT): Doneen Poisson, MD   Encounter Date: 02/12/2020  PT End of Session - 02/12/20 0928    Visit Number  3    Number of Visits  8    Date for PT Re-Evaluation  03/18/20    Authorization Type  humana    PT Start Time  0930    PT Stop Time  1023    PT Time Calculation (min)  53 min    Activity Tolerance  Patient tolerated treatment well    Behavior During Therapy  [facility] for tasks assessed/performed       Past Medical History:  Diagnosis Date  . Degenerative joint disease (DJD) of hip 06/13/2018  . Essential hypertension, benign 04/22/2014  . History of hip replacement    right  . Hyperlipidemia 04/22/2014  . Osteopenia determined by x-ray 04/20/2018   T score -2.1 on Dexa scan 2017    Past Surgical History:  Procedure Laterality Date  . TONSILLECTOMY    . TOTAL HIP ARTHROPLASTY Left 12/15/2018   Procedure: LEFT TOTAL HIP ARTHROPLASTY ANTERIOR APPROACH;  Surgeon: Kathryne Hitch, MD;  Location: [facility];  Service: Orthopedics;  Laterality: Left;  . TOTAL HIP ARTHROPLASTY Right 09/21/2019   Procedure: RIGHT TOTAL HIP ARTHROPLASTY ANTERIOR APPROACH;  Surgeon: Kathryne Hitch, MD;  Location: [facility];  Service: Orthopedics;  Laterality: Right;    There were no vitals filed for this visit.  Subjective Assessment - 02/12/20 0930    Subjective  We made some progress. Both hips are tender in the TFL area.    Currently in Pain?  Other (Comment)    Pain Score  2     Pain Location  Hip    Pain Orientation  Left    Pain Descriptors / Indicators  Discomfort                       [facility] Adult PT Treatment/Exercise - 02/12/20 0001      Knee/Hip Exercises: Stretches   Hip  Flexor Stretch  Both;2 reps;30 seconds    Hip Flexor Stretch Limitations  kneeling and standing    ITB Stretch  Both;1 rep;60 seconds    ITB Stretch Limitations  sidelying in quad stretch position with opp foot on top of knee    Other Knee/Hip Stretches  hip adductor stretch bil 2 reps in standing x 30 seconds bilat; 1 set using 8 inch step      Knee/Hip Exercises: Aerobic   Stationary Bike  L2x3 min      Knee/Hip Exercises: Sidelying   Hip ABduction  Both;3 sets;10 reps    Hip ABduction Limitations  cues for 2 sec hold on right    Other Sidelying Knee/Hip Exercises  reverse clam with bil hip abducted 2 x 10 reps, on left held 10 sec x 5      Modalities   Modalities  Moist Heat      Moist Heat Therapy   Number Minutes Moist Heat  10 Minutes    Moist Heat Location  Hip      Manual Therapy   Manual Therapy  Soft tissue mobilization;Myofascial release    Manual therapy comments  Skilled palpation and monitoring of soft tissues during DN  Trigger Point Dry Needling - 02/12/20 0001    Consent Given?  Yes    Education Handout Provided  Previously provided    Muscles Treated Back/Hip  Gluteus medius;Gluteus minimus;Piriformis;Gluteus maximus;Tensor fascia lata;Iliopsoas;Quadratus lumborum    Dry Needling Comments  left    Gluteus Minimus Response  Twitch response elicited;Palpable increased muscle length    Gluteus Medius Response  Twitch response elicited;Palpable increased muscle length    Gluteus Maximus Response  Twitch response elicited;Palpable increased muscle length    Piriformis Response  Twitch response elicited;Palpable increased muscle length    Tensor Fascia Lata Response  Twitch response elicited;Palpable increased muscle length    Iliopsoas Response  Twitch response elicited;Palpable increased muscle length    Quadratus Lumborum Response  Twitch response elicited;Palpable increased muscle length                PT Long Term Goals - 02/05/20 1258       PT LONG TERM GOAL #1   Title  The patient will be indep with HEP for hip strengthening, stretching and self mobilization    Time  6    Period  Weeks    Target Date  03/18/20      PT LONG TERM GOAL #2   Title  The patient will improve L hip abduction and extensor strength to 5/5.    Time  6    Period  Weeks    Target Date  03/18/20      PT LONG TERM GOAL #3   Title  The patient will report no discomfort with daily activities and recreational activities.    Time  6    Period  Weeks    Target Date  03/18/20            Plan - 02/12/20 1825    Clinical Impression Statement  Patient making improvements overall. Still some discomfort in ant hip and pt reported feeling psoas stretch in the low back. She did well with strengthening and tolerated longer holds with some. She responded very well to DN in left psoas, TFL and QL today. Less TPs noted in gluteals but good response still.    PT Treatment/Interventions  ADLs/Self Care Home Management;Gait training;Stair training;Functional mobility training;Therapeutic activities;Therapeutic exercise;Manual techniques;Dry needling;Taping;Electrical Stimulation;Cryotherapy;Iontophoresis 4mg /ml Dexamethasone;Moist Heat;Patient/family education    PT Next Visit Plan  Assess Dry needling.  glut medius strengthening correcting form, hip stabilization SLS    PT Home Exercise Plan  Access Code: [email or web address]       Patient will benefit from skilled therapeutic intervention in order to improve the following deficits and impairments:  Decreased strength, Impaired flexibility, Pain, Increased fascial restricitons  Visit Diagnosis: Pain in left hip  Muscle weakness (generalized)  Stiffness of left hip, not elsewhere classified     Problem List Patient Active Problem List   Diagnosis Date Noted  . 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 Unilateral primary osteoarthritis, right hip 08/21/2019  . Unilateral primary osteoarthritis, left  hip 12/15/2018  . Status post total replacement of left hip 12/15/2018  . Degenerative joint disease (DJD) of hip 06/13/2018  . Osteopenia determined by x-ray 04/20/2018  . Tendonitis involving hip abductors 04/20/2018  . Heart murmur 05/24/2014  . Hyperlipidemia 04/22/2014  . Essential hypertension, benign 04/22/2014    Madelyn Flavors PT 02/12/2020, 6:30 PM  [facility] [place], [place], [address] Phone: [phone]   Fax:  [phone]  Name: Tanya Cortez  MRN: [id number] Date of Birth: 1951/06/20

## 2020-02-13 ENCOUNTER — Encounter: Payer: Self-pay | Admitting: Family Medicine

## 2020-02-13 ENCOUNTER — Ambulatory Visit (INDEPENDENT_AMBULATORY_CARE_PROVIDER_SITE_OTHER): Payer: Medicare PPO | Admitting: Family Medicine

## 2020-02-13 ENCOUNTER — Other Ambulatory Visit: Payer: Self-pay

## 2020-02-13 VITALS — BP 120/71 | HR 69 | Ht 64.0 in | Wt 137.0 lb

## 2020-02-13 DIAGNOSIS — E7800 Pure hypercholesterolemia, unspecified: Secondary | ICD-10-CM | POA: Diagnosis not present

## 2020-02-13 DIAGNOSIS — I10 Essential (primary) hypertension: Secondary | ICD-10-CM

## 2020-02-13 DIAGNOSIS — M8580 Other specified disorders of bone density and structure, unspecified site: Secondary | ICD-10-CM

## 2020-02-13 MED ORDER — ATORVASTATIN CALCIUM 40 MG PO TABS: 40.0000 mg | ORAL_TABLET | Freq: Every day | ORAL | 3 refills | Status: DC

## 2020-02-13 MED ORDER — ALENDRONATE SODIUM 70 MG PO TABS: 70.0000 mg | ORAL_TABLET | ORAL | 3 refills | Status: DC

## 2020-02-13 NOTE — Assessment & Plan Note (Signed)
Continue with alendronate.  Refills sent for the next year.  Continue with daily calcium plus vitamin D supplementation as well as weightbearing exercise.

## 2020-02-13 NOTE — Assessment & Plan Note (Signed)
Tolerating statin well.  Due for labs in august.

## 2020-02-13 NOTE — Assessment & Plan Note (Signed)
BP looks great off of medication.  Wonderful!!!!  F/U in 77months.  If everything looks great at that point then we will likely be able to go to once a year appointments.

## 2020-02-13 NOTE — Progress Notes (Signed)
Established Patient Office Visit  Subjective:  Patient ID: Tanya Cortez, female    DOB: 1951/09/26  Age: 69 y.o. MRN: [id number]  CC:  Chief Complaint  Patient presents with  . Hypertension    HPI Tanya Cortez presents for   F/U HTN - was previously on losartan 25.  Has been off of it for almost a year. She has been doing well off of medication.   She has had her right hip replacement and completed physical therapy.  She does have a history of osteopenia, with a T score of -2.2.  She does take calcium with vitamin D daily in addition to her alendronate which she needs refills on.  She was involved in the [employer].  Hyperlipidemia - tolerating stating well with no myalgias or significant side effects.  Lab Results  Component Value Date   CHOL 187 08/08/2019   HDL 74 08/08/2019   LDLCALC 99 08/08/2019   TRIG 54 08/08/2019   CHOLHDL 2.5 08/08/2019       Past Medical History:  Diagnosis Date  . Degenerative joint disease (DJD) of hip 06/13/2018  . Essential hypertension, benign 04/22/2014  . History of hip replacement    right  . Hyperlipidemia 04/22/2014  . Osteopenia determined by x-ray 04/20/2018   T score -2.1 on Dexa scan 2017    Past Surgical History:  Procedure Laterality Date  . TONSILLECTOMY    . TOTAL HIP ARTHROPLASTY Left 12/15/2018   Procedure: LEFT TOTAL HIP ARTHROPLASTY ANTERIOR APPROACH;  Surgeon: Kathryne Hitch, MD;  [facility]: [facility];  Service: Orthopedics;  Laterality: Left;  . TOTAL HIP ARTHROPLASTY Right 09/21/2019   Procedure: RIGHT TOTAL HIP ARTHROPLASTY ANTERIOR APPROACH;  Surgeon: Kathryne Hitch, MD;  Location: [facility];  Service: Orthopedics;  Laterality: Right;    Family History  Problem Relation Age of Onset  . Skin cancer Mother        squamous cell   . Hypertension Mother   . Breast cancer Mother   . COPD Mother   . Heart attack Maternal Grandfather   . Stroke Maternal Grandfather   . Heart attack  Father        died 72 yo  . Hypertension Sister     Social History   Socioeconomic History  . Marital status: Single    Spouse name: Not on file  . Number of children: Not on file  . Years of education: 40  . Highest education level: Bachelor's degree (e.g., BA, AB, BS)  Occupational History  . Occupation: high school principal    Comment: [employer]  Tobacco Use  . Smoking status: Never Smoker  . Smokeless tobacco: Never Used  Substance and Sexual Activity  . Alcohol use: No  . Drug use: No  . Sexual activity: Never  Other Topics Concern  . Not on file  Social History Narrative   Patient reports exercising regularly by doing 30 -45 mins daily of cardio. 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  She has a degree in education specialty.  Lives with [name].  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 Difficulty of Paying Living Expenses: Not on file  Food Insecurity:   . Worried About Programme researcher, broadcasting/film/video in the Last Year: Not on file  . Ran Out of Food in the Last Year: Not on file  Transportation Needs:   . Lack  of Transportation (Medical): Not on file  . Lack of Transportation (Non-Medical): Not on file  Physical Activity:   . Days of Exercise per Week: Not on file  . Minutes of Exercise per Session: Not on file  Stress:   . Feeling of Stress : Not on file  Social Connections:   . Frequency of Communication with Friends and Family: Not on file  . Frequency of Social Gatherings with Friends and Family: Not on file  . Attends Religious Services: Not on file  . Active Member of Clubs or Organizations: Not on file  . Attends Banker Meetings: Not on file  . Marital Status: Not on file  Intimate Partner Violence:   . Fear of Current or Ex-Partner: Not on file  . Emotionally Abused: Not on file  . Physically Abused: Not on file  . Sexually Abused: Not on file     Outpatient Medications Prior to Visit  Medication Sig Dispense Refill  . Calcium Carbonate-Vit D-Min (CALTRATE 600+D PLUS MINERALS) 600-800 MG-UNIT TABS Take 1 tablet by mouth 2 (two) times daily.    Marland Kitchen alendronate (FOSAMAX) 70 MG tablet TAKE 1 TABLET BY MOUTH EVERY 7 DAYS. TAKE  WITH  A  FULL  GLASS  OF  WATER  ON  AN  EMPTY  STOMACH. (Patient taking differently: Take 70 mg by mouth once a week. Fridays) 12 tablet 3  . atorvastatin (LIPITOR) 40 MG tablet Take 1 tablet by mouth once daily 90 tablet 3  . aspirin 81 MG chewable tablet Chew 1 tablet (81 mg total) by mouth 2 (two) times daily. (Patient not taking: Reported on 02/05/2020) 30 tablet 0  . HYDROcodone-acetaminophen (NORCO) 5-325 MG tablet Take 1-2 tablets by mouth every 6 (six) hours as needed for moderate pain. (Patient not taking: Reported on 02/05/2020) 40 tablet 0  . methocarbamol (ROBAXIN) 500 MG tablet Take 1 tablet (500 mg total) by mouth every 6 (six) hours as needed for muscle spasms. (Patient not taking: Reported on 02/05/2020) 40 tablet 0   No facility-administered medications prior to visit.    No Known Allergies  ROS Review of Systems    Objective:    Physical Exam  Constitutional: She is oriented to person, place, and time. She appears well-developed and well-nourished.  HENT:  Head: Normocephalic and atraumatic.  Cardiovascular: Normal rate, regular rhythm and normal heart sounds.  Pulmonary/Chest: Effort normal and breath sounds normal.  Neurological: She is alert and oriented to person, place, and time.  Skin: Skin is warm and dry.  Psychiatric: She has a normal mood and affect. Her behavior is normal.    BP 120/71   Pulse 69   Ht 5\' 4"  (1.626 m)   Wt 137 lb (62.1 kg)   SpO2 100%   BMI 23.52 kg/m  Wt Readings from Last 3 Encounters:  02/13/20 137 lb (62.1 kg)  02/11/20 138 lb (62.6 kg)  09/21/19 136 lb 11 oz (62 kg)     There are no preventive care reminders to display for this patient.  There  are no preventive care reminders to display for this patient.  Lab Results  Component Value Date   TSH 2.300 04/25/2014   Lab Results  Component Value Date   WBC 9.2 09/22/2019   HGB 11.2 (L) 09/22/2019   HCT 34.6 (L) 09/22/2019   MCV 96.1 09/22/2019   PLT 162 09/22/2019   Lab Results  Component Value Date   NA 134 (L) 09/22/2019   K 3.5  09/22/2019   CO2 24 09/22/2019   GLUCOSE 118 (H) 09/22/2019   BUN 10 09/22/2019   CREATININE 0.62 09/22/2019   BILITOT 0.5 08/08/2019   ALKPHOS 93 08/02/2017   AST 19 08/08/2019   ALT 9 08/08/2019   PROT 6.9 08/08/2019   ALBUMIN 4.5 08/02/2017   CALCIUM 8.0 (L) 09/22/2019   ANIONGAP 8 09/22/2019   Lab Results  Component Value Date   CHOL 187 08/08/2019   Lab Results  Component Value Date   HDL 74 08/08/2019   Lab Results  Component Value Date   LDLCALC 99 08/08/2019   Lab Results  Component Value Date   TRIG 54 08/08/2019   Lab Results  Component Value Date   CHOLHDL 2.5 08/08/2019   Lab Results  Component Value Date   HGBA1C 5.5 05/08/2014      Assessment & Plan:   Problem List Items Addressed This Visit      Cardiovascular and Mediastinum   Essential hypertension, benign - Primary    BP looks great off of medication.  Wonderful!!!!  F/U in 80months.  If everything looks great at that point then we will likely be able to go to once a year appointments.      Relevant Medications   atorvastatin (LIPITOR) 40 MG tablet     Other   Osteopenia determined by x-ray    Continue with alendronate.  Refills sent for the next year.  Continue with daily calcium plus vitamin D supplementation as well as weightbearing exercise.      Hyperlipidemia    Tolerating statin well.  Due for labs in august.        Relevant Medications   atorvastatin (LIPITOR) 40 MG tablet      Meds ordered this encounter  Medications  . alendronate (FOSAMAX) 70 MG tablet    Sig: Take 1 tablet (70 mg total) by mouth once a week. Fridays     Dispense:  12 tablet    Refill:  3  . atorvastatin (LIPITOR) 40 MG tablet    Sig: Take 1 tablet (40 mg total) by mouth daily.    Dispense:  90 tablet    Refill:  3    Follow-up: Return in about 6 months (around 08/12/2020) for check labs and lipids and BP.    Beatrice Lecher, MD

## 2020-02-15 ENCOUNTER — Other Ambulatory Visit: Payer: Self-pay

## 2020-02-15 ENCOUNTER — Ambulatory Visit: Payer: Medicare PPO | Admitting: Rehabilitative and Restorative Service Providers"

## 2020-02-15 ENCOUNTER — Encounter: Payer: Self-pay | Admitting: Rehabilitative and Restorative Service Providers"

## 2020-02-15 DIAGNOSIS — M25651 Stiffness of right hip, not elsewhere classified: Secondary | ICD-10-CM

## 2020-02-15 DIAGNOSIS — M6281 Muscle weakness (generalized): Secondary | ICD-10-CM | POA: Diagnosis not present

## 2020-02-15 DIAGNOSIS — M25652 Stiffness of left hip, not elsewhere classified: Secondary | ICD-10-CM

## 2020-02-15 DIAGNOSIS — M25552 Pain in left hip: Secondary | ICD-10-CM | POA: Diagnosis not present

## 2020-02-15 DIAGNOSIS — M25551 Pain in right hip: Secondary | ICD-10-CM | POA: Diagnosis not present

## 2020-02-15 NOTE — Therapy (Signed)
[facility] [place] [address] [place], [place], [address] Phone: [phone]   Fax:  [phone]  Physical Therapy Treatment  Patient Details  Name: Tanya Cortez MRN: [id number] Date of Birth: 1951-09-07 Referring Provider (PT): Doneen Poisson, MD   Encounter Date: 02/15/2020  PT End of Session - 02/15/20 1011    Visit Number  4    Number of Visits  8    Date for PT Re-Evaluation  03/18/20    Authorization Type  humana    PT Start Time  1014    PT Stop Time  1100    PT Time Calculation (min)  46 min    Activity Tolerance  Patient tolerated treatment well    Behavior During Therapy  [facility] for tasks assessed/performed       Past Medical History:  Diagnosis Date  . Degenerative joint disease (DJD) of hip 06/13/2018  . Essential hypertension, benign 04/22/2014  . History of hip replacement    right  . Hyperlipidemia 04/22/2014  . Osteopenia determined by x-ray 04/20/2018   T score -2.1 on Dexa scan 2017    Past Surgical History:  Procedure Laterality Date  . TONSILLECTOMY    . TOTAL HIP ARTHROPLASTY Left 12/15/2018   Procedure: LEFT TOTAL HIP ARTHROPLASTY ANTERIOR APPROACH;  Surgeon: Kathryne Hitch, MD;  Location: [facility];  Service: Orthopedics;  Laterality: Left;  . TOTAL HIP ARTHROPLASTY Right 09/21/2019   Procedure: RIGHT TOTAL HIP ARTHROPLASTY ANTERIOR APPROACH;  Surgeon: Kathryne Hitch, MD;  Location: [facility];  Service: Orthopedics;  Laterality: Right;    There were no vitals filed for this visit.  Subjective Assessment - 02/15/20 1119    Subjective  The patient continues with discomfort in L lateral thigh.    Patient Stated Goals  reduce L hip discomfort.    Currently in Pain?  Yes    Pain Score  2     Pain Location  Hip    Pain Orientation  Left    Pain Descriptors / Indicators  Discomfort    Pain Onset  More than a month ago    Pain Frequency  Intermittent    Aggravating Factors    laying on the L side, general activity    Pain Relieving Factors  unsure                       [facility] Adult PT Treatment/Exercise - 02/15/20 1044      Ambulation/Gait   Ambulation/Gait  Yes    Ambulation/Gait Assistance  7: Independent    Pre-Gait Activities  Slowed gait emphasizing bilatral heel strike at initial stance phase + knee extension.    Gait Comments  Treadmill walking without UE support 2.2-2.5 mph at 0% incline and then up to 4% incline.  Used for warm up and gait analysis watching changes in femoral rotation with incline.  Patient has greater rotation of L femur at hip then R.      Exercises   Exercises  Knee/Hip      Knee/Hip Exercises: Stretches   Quad Stretch  Right;Left;2 reps;30 seconds    Quad Stretch Limitations  prone with strap adding pillow to distal femur to get greater hip extension    Hip Flexor Stretch  Right;Left;1 rep;30 seconds    Hip Flexor Stretch Limitations  Standing lunge with anterior hip flexor stretch    ITB Stretch  Right;Left;1 rep;30 seconds    ITB Stretch Limitations  Supine with  knee crossed to get hip flexor + ITB stretch, sidelying position    Piriformis Stretch  Right;Left;1 rep;30 seconds    Piriformis Stretch Limitations  greater tightness noted in R hip    Other Knee/Hip Stretches  hip adductor stretch bil 2 reps in standing x 30 seconds bilat; 1 set using 8 inch step      Knee/Hip Exercises: Aerobic   Tread Mill  3 minutes (see ambulation)      Knee/Hip Exercises: Standing   Forward Lunges  Right;Left;10 reps    Forward Lunges Limitations  forward and then backward lunges x 10 reps each    Side Lunges  Right;Left;5 reps    Side Lunges Limitations  Beginning on compliant foam stepping wide/laterally into side lunge and return to compliant surface    Hip Abduction  Stengthening;Right;Both;10 reps;Knee bent    Abduction Limitations  Standing near step moving L LE onto 12" high surface with hip flexed to 90, abducted  *needed cues to avoid trunk rotation and worked on glut set at end of movement, then performed with R LE.    Hip Extension  Right;Left;10 reps    Extension Limitations  performed through backwards walking x 20 steps x 2 reps    SLS  Single limb stance on compliant surface with cues to avoid hip rotation R anterior when in L LE stance    Other Standing Knee Exercises  Wide leg marching x 10 reps R and L alternating    Other Standing Knee Exercises  standing split lunge with posterior foot PF, + UE overhead reaching and trunk rotation for hip stabilization.      Knee/Hip Exercises: Supine   Bridges  Strengthening;Both;10 reps    Bridges Limitations  hip abductor band    Single Leg Bridge  Strengthening;Both;10 reps   bridges with marching     Knee/Hip Exercises: Sidelying   Hip ABduction  Strengthening;Right;Left;10 reps      Knee/Hip Exercises: Prone   Hip Extension  Right;Left;Strengthening;10 reps    Other Prone Exercises  Quadriped hip extension R and L x 10 reps with cues to decrease rotation, then added knee flexion while hip remained extended x 10 reps R and L.    Other Prone Exercises  Hip abduction with knee and hip flexed to 90 in quadriped x 10 reps R and L             PT Education - 02/15/20 1130    Education Details  added piriformis stretch to HEP    Person(s) Educated  Patient    Methods  Explanation;Demonstration;Handout    Comprehension  Returned demonstration;Verbalized understanding          PT Long Term Goals - 02/05/20 1258      PT LONG TERM GOAL #1   Title  The patient will be indep with HEP for hip strengthening, stretching and self mobilization    Time  6    Period  Weeks    Target Date  03/18/20      PT LONG TERM GOAL #2   Title  The patient will improve L hip abduction and extensor strength to 5/5.    Time  6    Period  Weeks    Target Date  03/18/20      PT LONG TERM GOAL #3   Title  The patient will report no discomfort with daily  activities and recreational activities.    Time  6    Period  Weeks  Target Date  03/18/20            Plan - 02/15/20 1131    Clinical Impression Statement  The patient has femoral rotation into IR during gait activities.  PT focused on hip abduction and ER strengthening and training deep hip stabilizers.  Continue working to [employer], anticipating drop to 1x/week with patient performing regular HEP.    PT Treatment/Interventions  ADLs/Self Care Home Management;Gait training;Stair training;Functional mobility training;Therapeutic activities;Therapeutic exercise;Manual techniques;Dry needling;Taping;Electrical Stimulation;Cryotherapy;Iontophoresis 4mg /ml Dexamethasone;Moist Heat;Patient/family education    PT Next Visit Plan  *From initial plan recommended 2x/week for 2 weeks and then 1x/week for 4 weeks as patient is very active and compliant with HEP.  Discuss at next visit and primary PT to either recert to continue at 2x per week or reduce frequency to 1x/week.  Discuss patient's needs and progress to determine.  Continue hip strengthening, single limb stance with ER + abduction, single limb stance activities and glut strengthening.    PT Home Exercise Plan  Access Code: [email or web address]    Consulted and Agree with Plan of Care  Patient       Patient will benefit from skilled therapeutic intervention in order to improve the following deficits and impairments:  Decreased strength, Impaired flexibility, Pain, Increased fascial restricitons  Visit Diagnosis: Pain in left hip  Muscle weakness (generalized)  Stiffness of right hip, not elsewhere classified  Stiffness of left hip, not elsewhere classified  Pain in right hip     Problem List Patient Active Problem List   Diagnosis Date Noted  . 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 Unilateral primary osteoarthritis, right hip 08/21/2019  . Unilateral primary osteoarthritis, left hip 12/15/2018  . Status post total  replacement of left hip 12/15/2018  . Degenerative joint disease (DJD) of hip 06/13/2018  . Osteopenia determined by x-ray 04/20/2018  . Tendonitis involving hip abductors 04/20/2018  . Heart murmur 05/24/2014  . Hyperlipidemia 04/22/2014  . Essential hypertension, benign 04/22/2014    Dasher, PT 02/15/2020, 11:35 AM  [facility] [place], [place], [address] Phone: [phone]   Fax:  [phone]  Name: Tanya Cortez MRN: [id number] Date of Birth: 10-19-1951

## 2020-02-15 NOTE — Patient Instructions (Signed)
Access Code: [email or web address]  URL: [email or web address]  Date: 02/15/2020  Prepared by: Margretta Ditty   Exercises Sidelying Hip Abduction - 10 reps - 1 sets - 2x daily - 7x weekly Reverse Clamshell in Extension and Abduction - 10 reps - 1 sets - 2x daily - 7x weekly Marching Bridge - 10 reps - 1 sets - 2x daily - 7x weekly Foam Roller with ITB Release with Wrap - 1 reps - 1 sets - 2x daily                            - 7x weekly Thomas Stretch on Table - 3 reps - 1 sets - 30 minutes hold - 2x daily - 7x weekly Sidelying TFL Stretch - 3 reps - 1 sets - 30 seconds hold - 2x daily                            - 7x weekly Side Lunge Adductor Stretch - 3 reps - 1 sets - 30 seconds hold - 2x daily - 7x weekly Supine Piriformis Stretch with Foot on Ground - 3 reps - 1 sets - 20-30 seconds hold - 2x daily - 7x weekly

## 2020-02-19 ENCOUNTER — Ambulatory Visit: Payer: Medicare PPO | Admitting: Physical Therapy

## 2020-02-19 ENCOUNTER — Other Ambulatory Visit: Payer: Self-pay

## 2020-02-19 DIAGNOSIS — M25552 Pain in left hip: Secondary | ICD-10-CM | POA: Diagnosis not present

## 2020-02-19 DIAGNOSIS — M6281 Muscle weakness (generalized): Secondary | ICD-10-CM | POA: Diagnosis not present

## 2020-02-19 DIAGNOSIS — M25652 Stiffness of left hip, not elsewhere classified: Secondary | ICD-10-CM

## 2020-02-19 DIAGNOSIS — M25651 Stiffness of right hip, not elsewhere classified: Secondary | ICD-10-CM | POA: Diagnosis not present

## 2020-02-19 NOTE — Therapy (Signed)
[place], [place], [address] Phone: [phone]   Fax:  [phone]  Physical Therapy Treatment  Patient Details  Name: Tanya Cortez MRN: [id number] Date of Birth: 1951/07/29 Referring Provider (PT): Jean Rosenthal, MD   Encounter Date: 02/19/2020  PT End of Session - 02/19/20 1051    Visit Number  5    Number of Visits  12    Date for PT Re-Evaluation  03/18/20    PT Start Time  1018    PT Stop Time  1113    PT Time Calculation (min)  55 min    Activity Tolerance  Patient tolerated treatment well    Behavior During Therapy  [facility] for tasks assessed/performed       Past Medical History:  Diagnosis Date  . Degenerative joint disease (DJD) of hip 06/13/2018  . Essential hypertension, benign 04/22/2014  . History of hip replacement    right  . Hyperlipidemia 04/22/2014  . Osteopenia determined by x-ray 04/20/2018   T score -2.1 on Dexa scan 2017    Past Surgical History:  Procedure Laterality Date  . TONSILLECTOMY    . TOTAL HIP ARTHROPLASTY Left 12/15/2018   Procedure: LEFT TOTAL HIP ARTHROPLASTY ANTERIOR APPROACH;  Surgeon: Mcarthur Rossetti, MD;  Location: [facility];  Service: Orthopedics;  Laterality: Left;  . TOTAL HIP ARTHROPLASTY Right 09/21/2019   Procedure: RIGHT TOTAL HIP ARTHROPLASTY ANTERIOR APPROACH;  Surgeon: Mcarthur Rossetti, MD;  Location: [facility];  Service: Orthopedics;  Laterality: Right;    There were no vitals filed for this visit.  Subjective Assessment - 02/19/20 1023    Subjective  Patient reporting improvement overall. She did not have the discomfort last night that she normally does. She is still limited with hip flexion.    Patient Stated Goals  reduce L hip discomfort.    Currently in Pain?  Yes    Pain Location  Hip    Pain Orientation  Left    Pain Descriptors / Indicators  Discomfort         [facility] PT Assessment - 02/19/20 0001      Strength   Right Hip Extension  5/5    Right Hip ABduction  --   5-/5   Left Hip Extension  5/5    Left Hip ABduction  5/5                   [facility] Adult PT Treatment/Exercise - 02/19/20 0001      Modalities   Modalities  Moist Heat      Moist Heat Therapy   Number Minutes Moist Heat  10 Minutes    Moist Heat Location  Hip;Other (comment)   right thigh     Manual Therapy   Manual Therapy  Soft tissue mobilization    Manual therapy comments  Skilled palpation and monitoring of soft tissues during DN     Soft tissue mobilization  IASTM to left quads and proximal left hip, and ITB       Trigger Point Dry Needling - 02/19/20 0001    Consent Given?  Yes    Education Handout Provided  Previously provided    Muscles Treated Lower Quadrant  Vastus lateralis    Muscles Treated Back/Hip  Gluteus minimus;Gluteus medius;Piriformis;Gluteus maximus    Dry Needling Comments  left    Electrical Stimulation Performed with Dry Needling  Yes    E-stim with Dry Needling Details  to left  vastus lateralis freq 8 x 5 min    Vastus lateralis Response  Twitch response elicited    Gluteus Minimus Response  Palpable increased muscle length    Gluteus Medius Response  Palpable increased muscle length    Gluteus Maximus Response  Twitch response elicited    Piriformis Response  Twitch response elicited;Palpable increased muscle length                PT Long Term Goals - 02/19/20 1105      PT LONG TERM GOAL #1   Title  The patient will be indep with HEP for hip strengthening, stretching and self mobilization    Status  Partially Met      PT LONG TERM GOAL #2   Title  The patient will improve L hip abduction and extensor strength to 5/5.    Status  Achieved      PT LONG TERM GOAL #3   Title  The patient will report no discomfort with daily activities and recreational activities.    Status  On-going      PT LONG TERM GOAL #4   Title  --      PT LONG TERM GOAL #5   Title   --            Plan - 02/19/20 1232    Clinical Impression Statement  Patient reporting her hip continues to improve. She would like to continue 2x/wk at this time. She feels she is learning a lot and this helps with her HEP. We focused on trying to get rid of the last bit of tightness she is having in the proximal and lateral leg. She did very well with IASTM and  DN/estim to her lateral quad as well as her hip. She demonstrated increased hip flexion in supine after DN. At end range she feels in medial hip joint. She has met her left hip strength goal. The patient has femoral rotation into IR during gait activities and we are working to strengthen her ER functionally. Other LTGs are ongoing    Comorbidities  osteopenia, h/o bilat anterior THA    PT Frequency  2x / week    PT Duration  4 weeks   6 wks total from initial eval   PT Treatment/Interventions  ADLs/Self Care Home Management;Gait training;Stair training;Functional mobility training;Therapeutic activities;Therapeutic exercise;Manual techniques;Dry needling;Taping;Electrical Stimulation;Cryotherapy;Iontophoresis 69m/ml Dexamethasone;Moist Heat;Patient/family education    PT Next Visit Plan  Continue hip strengthening, single limb stance with ER + abduction, single limb stance activities and glut strengthening.    PT Home Exercise Plan  Access Code: [email or web address]    Consulted and Agree with Plan of Care  Patient       Patient will benefit from skilled therapeutic intervention in order to improve the following deficits and impairments:  Decreased strength, Impaired flexibility, Pain, Increased fascial restricitons  Visit Diagnosis: Pain in left hip  Muscle weakness (generalized)  Stiffness of right hip, not elsewhere classified  Stiffness of left hip, not elsewhere classified     Problem List Patient Active Problem List   Diagnosis Date Noted  . 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 Unilateral primary  osteoarthritis, right hip 08/21/2019  . Unilateral primary osteoarthritis, left hip 12/15/2018  . Status post total replacement of left hip 12/15/2018  . Degenerative joint disease (DJD) of hip 06/13/2018  . Osteopenia determined by x-ray 04/20/2018  . Tendonitis involving hip abductors 04/20/2018  . Heart murmur 05/24/2014  . Hyperlipidemia 04/22/2014  .  Essential hypertension, benign 04/22/2014    Madelyn Flavors PT 02/19/2020, 2:28 PM  [facility] [place], [place], [address] Phone: [phone]   Fax:  [phone]  Name: Tanya Cortez MRN: [id number] Date of Birth: 05/19/1951

## 2020-02-22 ENCOUNTER — Encounter: Payer: Medicare PPO | Admitting: Rehabilitative and Restorative Service Providers"

## 2020-02-26 ENCOUNTER — Encounter: Payer: Self-pay | Admitting: Rehabilitative and Restorative Service Providers"

## 2020-02-26 ENCOUNTER — Ambulatory Visit: Payer: Medicare PPO | Admitting: Rehabilitative and Restorative Service Providers"

## 2020-02-26 ENCOUNTER — Other Ambulatory Visit: Payer: Self-pay

## 2020-02-26 DIAGNOSIS — M25552 Pain in left hip: Secondary | ICD-10-CM

## 2020-02-26 DIAGNOSIS — M6281 Muscle weakness (generalized): Secondary | ICD-10-CM

## 2020-02-26 DIAGNOSIS — M25651 Stiffness of right hip, not elsewhere classified: Secondary | ICD-10-CM | POA: Diagnosis not present

## 2020-02-26 DIAGNOSIS — M25652 Stiffness of left hip, not elsewhere classified: Secondary | ICD-10-CM

## 2020-02-26 DIAGNOSIS — M25551 Pain in right hip: Secondary | ICD-10-CM | POA: Diagnosis not present

## 2020-02-26 NOTE — Therapy (Signed)
[place], [place], [address] Phone: [phone]   Fax:  [phone]  Physical Therapy Treatment  Patient Details  Name: Tanya Cortez MRN: [id number] Date of Birth: 06-22-1951 Referring Provider (PT): Jean Rosenthal, MD   Encounter Date: 02/26/2020  PT End of Session - 02/26/20 1015    Visit Number  6    Number of Visits  12    Date for PT Re-Evaluation  03/18/20    PT Start Time  9509    PT Stop Time  1104    PT Time Calculation (min)  49 min    Activity Tolerance  Patient tolerated treatment well       Past Medical History:  Diagnosis Date  . Degenerative joint disease (DJD) of hip 06/13/2018  . Essential hypertension, benign 04/22/2014  . History of hip replacement    right  . Hyperlipidemia 04/22/2014  . Osteopenia determined by x-ray 04/20/2018   T score -2.1 on Dexa scan 2017    Past Surgical History:  Procedure Laterality Date  . TONSILLECTOMY    . TOTAL HIP ARTHROPLASTY Left 12/15/2018   Procedure: LEFT TOTAL HIP ARTHROPLASTY ANTERIOR APPROACH;  Surgeon: Mcarthur Rossetti, MD;  Location: [facility];  Service: Orthopedics;  Laterality: Left;  . TOTAL HIP ARTHROPLASTY Right 09/21/2019   Procedure: RIGHT TOTAL HIP ARTHROPLASTY ANTERIOR APPROACH;  Surgeon: Mcarthur Rossetti, MD;  Location: [facility];  Service: Orthopedics;  Laterality: Right;    There were no vitals filed for this visit.  Subjective Assessment - 02/26/20 1016    Subjective  Doing well. Still has a "few hot spots" Lt > Rt. Still on the recumbent bike 30 min each day; walking; golfing; working out with trainer 3 days/wk. Still "chasing it'    Currently in Pain?  No/denies    Pain Location  Hip    Pain Orientation  Left;Right    Pain Descriptors / Indicators  Discomfort    Pain Frequency  Intermittent    Aggravating Factors   lying on Lt side at night; activity                        [facility] Adult PT Treatment/Exercise - 02/26/20 0001      Therapeutic Activites    Therapeutic Activities  --   myofacial ball release anterior psoas prone     Knee/Hip Exercises: Stretches   Piriformis Stretch  Right;Left;30 seconds;3 reps   supine travell      Knee/Hip Exercises: Aerobic   Stationary Bike  L2x3 min      Modalities   Modalities  Moist Heat;Electrical Stimulation      Moist Heat Therapy   Number Minutes Moist Heat  12 Minutes    Moist Heat Location  Hip;Other (comment)   posterior Lt      Electrical Stimulation   Electrical Stimulation Location  posterior lateral Lt hip     Electrical Stimulation Action  IFC    Electrical Stimulation Parameters  to tolerance    Electrical Stimulation Goals  Tone;Pain      Manual Therapy   Manual therapy comments  Skilled palpation and monitoring of soft tissues during DN - pt prone     Soft tissue mobilization  deep tissue work through the Lt posterior hip musculature into lateral hip     Myofascial Release  Lt posterior hip        Trigger Point Dry Needling - 02/26/20 0001  Consent Given?  Yes    Education Handout Provided  Previously provided    Dry Needling Comments  left    Gluteus Minimus Response  Palpable increased muscle length    Gluteus Medius Response  Palpable increased muscle length    Gluteus Maximus Response  Palpable increased muscle length    Piriformis Response  Palpable increased muscle length    Tensor Fascia Lata Response  Palpable increased muscle length           PT Education - 02/26/20 1103    Education Details  HEP piriformis supine travell; myofacial ball release work    [employer]) Educated  Patient    Methods  Explanation;Demonstration;Tactile cues;Verbal cues    Comprehension  Verbalized understanding;Returned demonstration;Verbal cues required;Tactile cues required          PT Long Term Goals - 02/19/20 1105      PT LONG TERM GOAL #1    Title  The patient will be indep with HEP for hip strengthening, stretching and self mobilization    Status  Partially Met      PT LONG TERM GOAL #2   Title  The patient will improve L hip abduction and extensor strength to 5/5.    Status  Achieved      PT LONG TERM GOAL #3   Title  The patient will report no discomfort with daily activities and recreational activities.    Status  On-going      PT LONG TERM GOAL #4   Title  --      PT LONG TERM GOAL #5   Title  --            Plan - 02/26/20 1104    Clinical Impression Statement  Persistent muscular tightness Lt > Rt hip/proximal lateral thigh. Good response to DN and deep tissue work. Patient is working on [employer] and is independent in a good program. Continues to benefit from DN and manual work.    Rehab Potential  Good    PT Frequency  2x / week    PT Duration  4 weeks    PT Treatment/Interventions  ADLs/Self Care Home Management;Gait training;Stair training;Functional mobility training;Therapeutic activities;Therapeutic exercise;Manual techniques;Dry needling;Taping;Electrical Stimulation;Cryotherapy;Iontophoresis 29m/ml Dexamethasone;Moist Heat;Patient/family education    PT Next Visit Plan  Assess response to DN/manual work. Continue hip strengthening, single limb stance with ER + abduction, single limb stance activities and glut strengthening.       Patient will benefit from skilled therapeutic intervention in order to improve the following deficits and impairments:     Visit Diagnosis: Pain in left hip  Muscle weakness (generalized)  Stiffness of right hip, not elsewhere classified  Stiffness of left hip, not elsewhere classified  Pain in right hip     Problem List Patient Active Problem List   Diagnosis Date Noted  . 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 Unilateral primary osteoarthritis, right hip 08/21/2019  . Unilateral primary osteoarthritis, left hip 12/15/2018  . Status post total  replacement of left hip 12/15/2018  . Degenerative joint disease (DJD) of hip 06/13/2018  . Osteopenia determined by x-ray 04/20/2018  . Tendonitis involving hip abductors 04/20/2018  . Heart murmur 05/24/2014  . Hyperlipidemia 04/22/2014  . Essential hypertension, benign 04/22/2014    Teancum Brule PNilda SimmerPT, MPH  02/26/2020, 11:07 AM  [facility] [address]Phone: [phone]  Fax:  [phone] Name: Tanya Cortez  MRN: [id number] Date of Birth: 1951/06/20

## 2020-02-26 NOTE — Patient Instructions (Signed)
Myofacial ball release work

## 2020-02-29 ENCOUNTER — Encounter: Payer: Medicare PPO | Admitting: Rehabilitative and Restorative Service Providers"

## 2020-03-03 ENCOUNTER — Encounter: Payer: Medicare PPO | Admitting: Rehabilitative and Restorative Service Providers"

## 2020-03-04 ENCOUNTER — Encounter: Payer: Medicare PPO | Admitting: Rehabilitative and Restorative Service Providers"

## 2020-03-14 ENCOUNTER — Other Ambulatory Visit: Payer: Self-pay

## 2020-03-14 ENCOUNTER — Ambulatory Visit: Payer: Medicare PPO | Admitting: Rehabilitative and Restorative Service Providers"

## 2020-03-14 ENCOUNTER — Encounter: Payer: Self-pay | Admitting: Rehabilitative and Restorative Service Providers"

## 2020-03-14 DIAGNOSIS — M6281 Muscle weakness (generalized): Secondary | ICD-10-CM

## 2020-03-14 DIAGNOSIS — M25552 Pain in left hip: Secondary | ICD-10-CM | POA: Diagnosis not present

## 2020-03-14 DIAGNOSIS — M25551 Pain in right hip: Secondary | ICD-10-CM

## 2020-03-14 DIAGNOSIS — M25652 Stiffness of left hip, not elsewhere classified: Secondary | ICD-10-CM | POA: Diagnosis not present

## 2020-03-14 DIAGNOSIS — M25651 Stiffness of right hip, not elsewhere classified: Secondary | ICD-10-CM

## 2020-03-14 NOTE — Patient Instructions (Signed)
Access Code: [email or web address]: [email or web address]: 03/19/2021Prepared by: Lynore Coscia HoltExercises  Sidelying Hip Abduction - 2 x daily - 7 x weekly - 10 reps - 1 sets  Reverse Clamshell in Extension and Abduction - 2 x daily - 7 x weekly - 10 reps - 1 sets  Marching Bridge - 2 x daily - 7 x weekly - 10 reps - 1 sets  Foam Roller with ITB Release with Wrap - 2 x daily - 7 x weekly - 1 reps - 1 sets  [employer] on Table - 2 x daily - 7 x weekly - 3 reps - 1 sets - 30 minutes hold  Sidelying TFL Stretch - 2 x daily - 7 x weekly - 3 reps - 1 sets - 30 seconds hold  Side Lunge Adductor Stretch - 2 x daily - 7 x weekly - 3 reps - 1 sets - 30 seconds hold  Supine Piriformis Stretch with Foot on Ground - 2 x daily - 7 x weekly - 3 reps - 1 sets - 20-30 seconds hold  Half Kneeling Hip Flexor Stretch - 2 x daily - 7 x weekly - 3 reps - 1 sets - 30 sec hold  Hip Flexor Stretch with Chair - 2 x daily - 7 x weekly - 3 reps - 1 sets - 30 sec hold

## 2020-03-14 NOTE — Therapy (Signed)
[place], [place], [address] Phone: [phone]   Fax:  [phone]  Physical Therapy Treatment  Patient Details  Name: Tanya Cortez MRN: [id number] Date of Birth: Oct 18, 1951 Referring Provider (PT): Jean Rosenthal, MD   Encounter Date: 03/14/2020  PT End of Session - 03/14/20 0935    Visit Number  7    Number of Visits  12    Date for PT Re-Evaluation  03/18/20    PT Start Time  0925    PT Stop Time  1020    PT Time Calculation (min)  55 min    Activity Tolerance  Patient tolerated treatment well       Past Medical History:  Diagnosis Date  . Degenerative joint disease (DJD) of hip 06/13/2018  . Essential hypertension, benign 04/22/2014  . History of hip replacement    right  . Hyperlipidemia 04/22/2014  . Osteopenia determined by x-ray 04/20/2018   T score -2.1 on Dexa scan 2017    Past Surgical History:  Procedure Laterality Date  . TONSILLECTOMY    . TOTAL HIP ARTHROPLASTY Left 12/15/2018   Procedure: LEFT TOTAL HIP ARTHROPLASTY ANTERIOR APPROACH;  Surgeon: Mcarthur Rossetti, MD;  Location: [facility];  Service: Orthopedics;  Laterality: Left;  . TOTAL HIP ARTHROPLASTY Right 09/21/2019   Procedure: RIGHT TOTAL HIP ARTHROPLASTY ANTERIOR APPROACH;  Surgeon: Mcarthur Rossetti, MD;  Location: [facility];  Service: Orthopedics;  Laterality: Right;    There were no vitals filed for this visit.  Subjective Assessment - 03/14/20 0935    Subjective  Just a spot in the Lt anterior lateral hip that is sore    Currently in Pain?  No/denies    Pain Location  Hip    Pain Orientation  Left    Pain Descriptors / Indicators  Sore    Pain Onset  More than a month ago    Pain Frequency  Intermittent                       [facility] Adult PT Treatment/Exercise - 03/14/20 0001      Knee/Hip Exercises: Stretches   Hip Flexor Stretch  Right;Left;3 reps;30 seconds    Hip  Flexor Stretch Limitations  high kneel - varying angle for last stretch       Knee/Hip Exercises: Aerobic   Tread Mill  4 min 3.0 mph; walking backward on treadmill ~ 1 mph - difficult     Other Aerobic  backwards walking 20 ft x 8 reps focus on hip extension to stretch anterior hip       Moist Heat Therapy   Number Minutes Moist Heat  12 Minutes    Moist Heat Location  Hip      Electrical Stimulation   Electrical Stimulation Location  posterior lateral Lt hip     Electrical Stimulation Action  IFC    Electrical Stimulation Parameters  to tolerance    Electrical Stimulation Goals  Tone;Pain      Manual Therapy   Manual therapy comments  Skilled palpation and monitoring of soft tissues during DN - pt prone     Soft tissue mobilization  deep tissue work through the Lt anterior lateral hip musculature     Myofascial Release  Lt anterior lateral hip/thigh       Trigger Point Dry Needling - 03/14/20 0001    Consent Given?  Yes    Education Handout Provided  Previously  provided    Dry Needling Comments  left    Rectus femoris Response  Palpable increased muscle length;Twitch response elicited    Vastus intermedius Response  Palpable increased muscle length;Twitch response elicited    Tensor Fascia Lata Response  Palpable increased muscle length    Iliopsoas Response  Palpable increased muscle length           PT Education - 03/14/20 0950    Education Details  HEP - backward walking    Person(s) Educated  Patient    Methods  Explanation;Demonstration;Tactile cues;Verbal cues;Handout    Comprehension  Verbalized understanding;Returned demonstration;Verbal cues required;Tactile cues required          PT Long Term Goals - 02/19/20 1105      PT LONG TERM GOAL #1   Title  The patient will be indep with HEP for hip strengthening, stretching and self mobilization    Status  Partially Met      PT LONG TERM GOAL #2   Title  The patient will improve L hip abduction and extensor  strength to 5/5.    Status  Achieved      PT LONG TERM GOAL #3   Title  The patient will report no discomfort with daily activities and recreational activities.    Status  On-going      PT LONG TERM GOAL #4   Title  --      PT LONG TERM GOAL #5   Title  --            Plan - 03/14/20 0942    Clinical Impression Statement  Continued tightness Lt anterior lateral hip area. Positive response to hip flexor stretch in hig kneel. Good response to DN and manual work. Progressing well toward stated goals of therapy. Will benefit from continued treatment to address problems identified.    Rehab Potential  Good    PT Frequency  2x / week    PT Duration  4 weeks    PT Treatment/Interventions  ADLs/Self Care Home Management;Gait training;Stair training;Functional mobility training;Therapeutic activities;Therapeutic exercise;Manual techniques;Dry needling;Taping;Electrical Stimulation;Cryotherapy;Iontophoresis 63m/ml Dexamethasone;Moist Heat;Patient/family education    PT Next Visit Plan  continue DN/manual work. Continue hip strengthening, single limb stance with ER + abduction, single limb stance activities and glut strengthening.    PT Home Exercise Plan  Access Code: [email or web address]       Patient will benefit from skilled therapeutic intervention in order to improve the following deficits and impairments:     Visit Diagnosis: Pain in left hip  Muscle weakness (generalized)  Stiffness of right hip, not elsewhere classified  Stiffness of left hip, not elsewhere classified  Pain in right hip     Problem List Patient Active Problem List   Diagnosis Date Noted  . 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 Unilateral primary osteoarthritis, right hip 08/21/2019  . Unilateral primary osteoarthritis, left hip 12/15/2018  . Status post total replacement of left hip 12/15/2018  . Degenerative joint disease (DJD) of hip 06/13/2018  . Osteopenia determined by x-ray 04/20/2018   . Tendonitis involving hip abductors 04/20/2018  . Heart murmur 05/24/2014  . Hyperlipidemia 04/22/2014  . Essential hypertension, benign 04/22/2014    [name] PNilda SimmerPT, MPH  03/14/2020, 10:21 AM  [facility] [address]Phone: [phone]  Fax:  [phone] Name: Tanya BEANMRN: [id number] of Birth: 1Jan 15, 1952

## 2020-03-21 ENCOUNTER — Encounter: Payer: Self-pay | Admitting: Rehabilitative and Restorative Service Providers"

## 2020-03-21 ENCOUNTER — Ambulatory Visit: Payer: Medicare PPO | Admitting: Rehabilitative and Restorative Service Providers"

## 2020-03-21 ENCOUNTER — Other Ambulatory Visit: Payer: Self-pay

## 2020-03-21 DIAGNOSIS — M6281 Muscle weakness (generalized): Secondary | ICD-10-CM | POA: Diagnosis not present

## 2020-03-21 DIAGNOSIS — M25652 Stiffness of left hip, not elsewhere classified: Secondary | ICD-10-CM

## 2020-03-21 DIAGNOSIS — M25552 Pain in left hip: Secondary | ICD-10-CM

## 2020-03-21 NOTE — Patient Instructions (Signed)
Access Code: [email or web address]: [email or web address]: 03/26/2021Prepared by: Raynard Mapps HoltExercises  ITB Stretch at Wall - 2 x daily - 7 x weekly - 3 reps - 1 sets - 30 sec hold  Sidelying ITB Stretch off Table - 2 x daily - 7 x weekly - 3 reps - 1 sets - 30 sec hold

## 2020-03-21 NOTE — Therapy (Signed)
[place], [place], [address] Phone: [phone]   Fax:  [phone]  Physical Therapy Treatment  Patient Details  Name: Tanya Cortez MRN: [id number] Date of Birth: 09/29/1951 Referring Provider (PT): Jean Rosenthal, MD   Encounter Date: 03/21/2020  PT End of Session - 03/21/20 0933    Visit Number  8    Number of Visits  12    Date for PT Re-Evaluation  03/18/20    PT Start Time  0930    PT Stop Time  1015    PT Time Calculation (min)  45 min       Past Medical History:  Diagnosis Date  . Degenerative joint disease (DJD) of hip 06/13/2018  . Essential hypertension, benign 04/22/2014  . History of hip replacement    right  . Hyperlipidemia 04/22/2014  . Osteopenia determined by x-ray 04/20/2018   T score -2.1 on Dexa scan 2017    Past Surgical History:  Procedure Laterality Date  . TONSILLECTOMY    . TOTAL HIP ARTHROPLASTY Left 12/15/2018   Procedure: LEFT TOTAL HIP ARTHROPLASTY ANTERIOR APPROACH;  Surgeon: Mcarthur Rossetti, MD;  Location: [facility];  Service: Orthopedics;  Laterality: Left;  . TOTAL HIP ARTHROPLASTY Right 09/21/2019   Procedure: RIGHT TOTAL HIP ARTHROPLASTY ANTERIOR APPROACH;  Surgeon: Mcarthur Rossetti, MD;  Location: [facility];  Service: Orthopedics;  Laterality: Right;    There were no vitals filed for this visit.  Subjective Assessment - 03/21/20 0936    Subjective  Just a spot in the Lt anterior lateral hip that is tight - otherwise doing well - better with therapy    Currently in Pain?  No/denies                       [facility] Adult PT Treatment/Exercise - 03/21/20 0001      Knee/Hip Exercises: Stretches   Hip Flexor Stretch  Right;Left;3 reps;30 seconds    Hip Flexor Stretch Limitations  high kneel - varying angle for last stretch     ITB Stretch  Left;Right;3 reps;30 seconds   standing side to wall and in sidelying      Knee/Hip Exercises: Aerobic   Tread Mill  6 min 2.0 mph; walking backward on treadmill slowing speed to focus on gait pattern     Other Aerobic  backwards walking 20 ft x 8 reps focus on hip extension to stretch anterior hip       Moist Heat Therapy   Number Minutes Moist Heat  12 Minutes    Moist Heat Location  Hip      Electrical Stimulation   Electrical Stimulation Location  posterior lateral Lt hip     Electrical Stimulation Action  IFC    Electrical Stimulation Parameters  to tolerance    Electrical Stimulation Goals  Tone;Pain      Manual Therapy   Manual therapy comments  Skilled palpation and monitoring of soft tissues during DN - pt prone    pt sidelying    Soft tissue mobilization  deep tissue work through the Lt lateral hip musculature     Myofascial Release  Lt lateral hip/thigh       Trigger Point Dry Needling - 03/21/20 0001    Consent Given?  Yes    Education Handout Provided  Previously provided    Dry Needling Comments  left    Electrical Stimulation Performed with Dry Needling  Yes  Tensor Fascia Lata Response  Palpable increased muscle length           PT Education - 03/21/20 0943    Education Details  HEP - slow walking to elongate musculaure    Person(s) Educated  Patient    Methods  Explanation;Demonstration;Tactile cues;Verbal cues;Handout    Comprehension  Verbalized understanding;Returned demonstration;Verbal cues required;Tactile cues required          PT Long Term Goals - 02/19/20 1105      PT LONG TERM GOAL #1   Title  The patient will be indep with HEP for hip strengthening, stretching and self mobilization    Status  Partially Met      PT LONG TERM GOAL #2   Title  The patient will improve L hip abduction and extensor strength to 5/5.    Status  Achieved      PT LONG TERM GOAL #3   Title  The patient will report no discomfort with daily activities and recreational activities.    Status  On-going      PT LONG TERM GOAL #4    Title  --      PT LONG TERM GOAL #5   Title  --            Plan - 03/21/20 0959    Clinical Impression Statement  Continued improvement with less pain and improved mobility. Patient presents today with tightness in Lt TFA/ITB - responded well to manual work DN and stretches. Schedule in two weeks for reassessment.    Rehab Potential  Good    PT Frequency  2x / week    PT Duration  4 weeks    PT Treatment/Interventions  ADLs/Self Care Home Management;Gait training;Stair training;Functional mobility training;Therapeutic activities;Therapeutic exercise;Manual techniques;Dry needling;Taping;Electrical Stimulation;Cryotherapy;Iontophoresis 74m/ml Dexamethasone;Moist Heat;Patient/family education    PT Next Visit Plan  continue DN/manual work. Continue hip strengthening, single limb stance with ER + abduction, single limb stance activities and glut strengthening. - will schedule for return in 2 week f/u    PT Home Exercise Plan  Access Code: [email or web address]   Consulted and Agree with Plan of Care  Patient       Patient will benefit from skilled therapeutic intervention in order to improve the following deficits and impairments:     Visit Diagnosis: Pain in left hip  Muscle weakness (generalized)  Stiffness of left hip, not elsewhere classified     Problem List Patient Active Problem List   Diagnosis Date Noted  . 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 Unilateral primary osteoarthritis, right hip 08/21/2019  . Unilateral primary osteoarthritis, left hip 12/15/2018  . Status post total replacement of left hip 12/15/2018  . Degenerative joint disease (DJD) of hip 06/13/2018  . Osteopenia determined by x-ray 04/20/2018  . Tendonitis involving hip abductors 04/20/2018  . Heart murmur 05/24/2014  . Hyperlipidemia 04/22/2014  . Essential hypertension, benign 04/22/2014    Miski Feldpausch PNilda SimmerPT, MPH  03/21/2020, 10:11 AM  [facility] [address]Phone: [phone]  Fax:  [phone] Name: PAARIONA MOMONMRN: [id number] of Birth: [birth year]

## 2020-04-04 ENCOUNTER — Encounter: Payer: Self-pay | Admitting: Rehabilitative and Restorative Service Providers"

## 2020-04-04 ENCOUNTER — Ambulatory Visit: Payer: Medicare PPO | Admitting: Rehabilitative and Restorative Service Providers"

## 2020-04-04 ENCOUNTER — Other Ambulatory Visit: Payer: Self-pay

## 2020-04-04 DIAGNOSIS — M25552 Pain in left hip: Secondary | ICD-10-CM | POA: Diagnosis not present

## 2020-04-04 DIAGNOSIS — M6281 Muscle weakness (generalized): Secondary | ICD-10-CM | POA: Diagnosis not present

## 2020-04-04 DIAGNOSIS — M25652 Stiffness of left hip, not elsewhere classified: Secondary | ICD-10-CM

## 2020-04-04 DIAGNOSIS — M25651 Stiffness of right hip, not elsewhere classified: Secondary | ICD-10-CM

## 2020-04-04 DIAGNOSIS — M25551 Pain in right hip: Secondary | ICD-10-CM

## 2020-04-04 NOTE — Therapy (Addendum)
[facility] [place] [address] [place], [place], [address] Phone: [phone]   Fax:  [phone]  Physical Therapy Treatment  Patient Details  Name: Tanya Cortez MRN: [id number] Date of Birth: [birth year] Referring Provider (PT): Doneen Poisson, MD   Encounter Date: 04/04/2020  PT End of Session - 04/04/20 0942    Visit Number  9    Number of Visits  16    Date for PT Re-Evaluation  05/02/20    Authorization Type  humana    PT Start Time  0928    PT Stop Time  1020    PT Time Calculation (min)  52 min    Activity Tolerance  Patient tolerated treatment well       Past Medical History:  Diagnosis Date  . Degenerative joint disease (DJD) of hip 06/13/2018  . Essential hypertension, benign 04/22/2014  . History of hip replacement    right  . Hyperlipidemia 04/22/2014  . Osteopenia determined by x-ray 04/20/2018   T score -2.1 on Dexa scan 2017    Past Surgical History:  Procedure Laterality Date  . TONSILLECTOMY    . TOTAL HIP ARTHROPLASTY Left 12/15/2018   Procedure: LEFT TOTAL HIP ARTHROPLASTY ANTERIOR APPROACH;  Surgeon: Kathryne Hitch, MD;  Location: [facility];  Service: Orthopedics;  Laterality: Left;  . TOTAL HIP ARTHROPLASTY Right 09/21/2019   Procedure: RIGHT TOTAL HIP ARTHROPLASTY ANTERIOR APPROACH;  Surgeon: Kathryne Hitch, MD;  Location: [facility];  Service: Orthopedics;  Laterality: Right;    There were no vitals filed for this visit.  Subjective Assessment - 04/04/20 0944    Subjective  Improving - with less stiffness in the hips - continued some in Lt > Rt with functional activities    Currently in Pain?  No/denies         [facility] PT Assessment - 04/04/20 0001      Assessment   Medical Diagnosis  L hip pain    Referring Provider (PT)  Doneen Poisson, MD    Onset Date/Surgical Date  12/15/18    Hand Dominance  Right      Posture/Postural Control   Posture Comments   improved posture and alignment with good hip extension in standing - no longer flexed forward at hips       Strength   Right Hip Extension  5/5    Right Hip ABduction  --   5-/5   Left Hip Extension  5/5    Left Hip ABduction  5/5      Flexibility   Hamstrings  WNL bilat    ITB  persistent tightness noted L side with palpable tenderness proximal IT Band and medial hamstring      Palpation   Palpation comment  Tender to palpation L TFL, IT Band, bilat posterior hip through glut med and piriformis ,       Ambulation/Gait   Gait Comments  Treadmill walking without UE support 2.2-2.5 mph at 0% incline and then up to 4% incline.  Used for warm up and gait analysis watching with improving gait pattern noted/improved hip extension through the Lt hip                   [facility] Adult PT Treatment/Exercise - 04/04/20 0001      Knee/Hip Exercises: Aerobic   Tread Mill  6 min 2.0 mph; walking backward on treadmill slowing speed to focus on gait pattern     Other Aerobic  backwards walking  20 ft x 8 reps focus on hip extension to stretch anterior hip       Knee/Hip Exercises: Standing   Other Standing Knee Exercises  hip hike on step x 10 reps each LE ; swing for ler      Moist Heat Therapy   Number Minutes Moist Heat  12 Minutes    Moist Heat Location  Hip      Electrical Stimulation   Electrical Stimulation Location  posterior hips bilat     Electrical Stimulation Action  IFC    Electrical Stimulation Parameters  to tolerance     Electrical Stimulation Goals  Tone;Pain      Manual Therapy   Manual therapy comments  Skilled palpation and monitoring of soft tissues during DN - pt prone    pt sidelying    Soft tissue mobilization  deep tissue work through the Lt lateral hip; posterior hips musculature     Myofascial Release  Lt lateral hip/thigh       Trigger Point Dry Needling - 04/04/20 0001    Consent Given?  Yes    Education Handout Provided  Previously provided     Dry Needling Comments  left    Electrical Stimulation Performed with Dry Needling  Yes    Gluteus Minimus Response  Palpable increased muscle length    Gluteus Medius Response  Palpable increased muscle length    Gluteus Maximus Response  Palpable increased muscle length    Piriformis Response  Palpable increased muscle length    Tensor Fascia Lata Response  Palpable increased muscle length           PT Education - 04/04/20 0951    Education Details  HEP    Person(s) Educated  Patient    Methods  Explanation;Demonstration;Tactile cues;Verbal cues;Handout    Comprehension  Verbalized understanding;Returned demonstration;Verbal cues required;Tactile cues required          PT Long Term Goals - 04/04/20 1313      PT LONG TERM GOAL #1   Title  The patient will be indep with HEP for hip strengthening, stretching and self mobilization    Time  6    Period  Weeks    Status  Revised    Target Date  05/02/20      PT LONG TERM GOAL #2   Title  The patient will improve L hip abduction strength to 5/5.    Time  6    Period  Weeks    Status  Revised    Target Date  05/02/20      PT LONG TERM GOAL #3   Title  The patient will report no discomfort with daily activities and recreational activities.    Time  6    Period  Weeks    Status  Revised    Target Date  05/02/20            Plan - 04/04/20 1002    Clinical Impression Statement  Continued porgress. Positive response to additional exercises and DN/manual work with decresaed tightness to palpation through the Lt TFL/ITB and posterior buttocks.    Rehab Potential  Good    PT Frequency  2x / week    PT Duration  4 weeks    PT Treatment/Interventions  ADLs/Self Care Home Management;Gait training;Stair training;Functional mobility training;Therapeutic activities;Therapeutic exercise;Manual techniques;Dry needling;Taping;Electrical Stimulation;Cryotherapy;Iontophoresis 4mg /ml Dexamethasone;Moist Heat;Patient/family education     PT Next Visit Plan  continue DN/manual work. Continue hip strengthening, single limb stance with  ER + abduction, single limb stance activities and glut strengthening. -    PT Home Exercise Plan  Access Code: [email or web address]; [email or web address]    Consulted and Agree with Plan of Care  Patient       Patient will benefit from skilled therapeutic intervention in order to improve the following deficits and impairments:     Visit Diagnosis: Pain in left hip  Muscle weakness (generalized)  Stiffness of left hip, not elsewhere classified  Stiffness of right hip, not elsewhere classified  Pain in right hip     Problem List Patient Active Problem List   Diagnosis Date Noted  . 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 Unilateral primary osteoarthritis, right hip 08/21/2019  . Unilateral primary osteoarthritis, left hip 12/15/2018  . Status post total replacement of left hip 12/15/2018  . Degenerative joint disease (DJD) of hip 06/13/2018  . Osteopenia determined by x-ray 04/20/2018  . Tendonitis involving hip abductors 04/20/2018  . Heart murmur 05/24/2014  . Hyperlipidemia 04/22/2014  . Essential hypertension, benign 04/22/2014    Rinda Rollyson Nilda Simmer PT, MPH  04/04/2020, 1:16 PM  [facility] [place], [place], [address] Phone: [phone]   Fax:  [phone]  Name: DELIANA AVALOS MRN: [id number] Date of Birth: [birth year]

## 2020-04-04 NOTE — Patient Instructions (Signed)
Standing on step with one foot - keep knee straight and drop opposite foot down Hold 3-5 sec  Return to level hips Repeat 10 times Repeat with other leg   Stand on step with one leg and swing other leg  ~ 30-60 sec  Repeat with other lift    Golfers lift  Balance on one foot with other foot straight out behind as you bend forward  5-10 reps

## 2020-04-04 NOTE — Addendum Note (Signed)
Addended by: Val Riles on: 04/04/2020 01:17 PM   Modules accepted: Orders

## 2020-04-15 ENCOUNTER — Encounter: Payer: Medicare PPO | Admitting: Rehabilitative and Restorative Service Providers"

## 2020-04-29 ENCOUNTER — Ambulatory Visit: Payer: Medicare PPO | Admitting: Rehabilitative and Restorative Service Providers"

## 2020-04-29 ENCOUNTER — Other Ambulatory Visit: Payer: Self-pay

## 2020-04-29 ENCOUNTER — Encounter: Payer: Self-pay | Admitting: Rehabilitative and Restorative Service Providers"

## 2020-04-29 DIAGNOSIS — M6281 Muscle weakness (generalized): Secondary | ICD-10-CM

## 2020-04-29 DIAGNOSIS — M25652 Stiffness of left hip, not elsewhere classified: Secondary | ICD-10-CM | POA: Diagnosis not present

## 2020-04-29 DIAGNOSIS — M25552 Pain in left hip: Secondary | ICD-10-CM

## 2020-04-29 NOTE — Therapy (Signed)
Progress Note 10th visit note  Reporting Period 02/05/20 to 04/29/20  See note below for Objective Data and Assessment of Progress/Goals.       [place], [place], [address] Phone: [phone]   Fax:  [phone]  Physical Therapy Treatment  Patient Details  Name: Tanya Cortez MRN: [id number] Date of Birth: 01-25-1951 Referring Provider (PT): Jean Rosenthal, MD   Encounter Date: 04/29/2020  PT End of Session - 04/29/20 0927    Visit Number  10    Number of Visits  16    Date for PT Re-Evaluation  06/10/20    Authorization Type  humana    PT Start Time  0854    PT Stop Time  0944    PT Time Calculation (min)  50 min    Activity Tolerance  Patient tolerated treatment well       Past Medical History:  Diagnosis Date  . Degenerative joint disease (DJD) of hip 06/13/2018  . Essential hypertension, benign 04/22/2014  . History of hip replacement    right  . Hyperlipidemia 04/22/2014  . Osteopenia determined by x-ray 04/20/2018   T score -2.1 on Dexa scan 2017    Past Surgical History:  Procedure Laterality Date  . TONSILLECTOMY    . TOTAL HIP ARTHROPLASTY Left 12/15/2018   Procedure: LEFT TOTAL HIP ARTHROPLASTY ANTERIOR APPROACH;  Surgeon: Mcarthur Rossetti, MD;  Location: [facility];  Service: Orthopedics;  Laterality: Left;  . TOTAL HIP ARTHROPLASTY Right 09/21/2019   Procedure: RIGHT TOTAL HIP ARTHROPLASTY ANTERIOR APPROACH;  Surgeon: Mcarthur Rossetti, MD;  Location: [facility];  Service: Orthopedics;  Laterality: Right;    There were no vitals filed for this visit.  Subjective Assessment - 04/29/20 0928    Subjective  doing pretty well - just having some tightness in the Lt side of her hip - can push on posterior hip and help decrease the pain in the outside of the hip    Currently in Pain?  Yes    Pain Score  2     Pain Location  Hip    Pain Orientation  Left    Pain  Descriptors / Indicators  Tightness;Sore    Pain Type  Chronic pain    Pain Onset  More than a month ago    Pain Frequency  Intermittent    Aggravating Factors   lying on Lt side; activity    Pain Relieving Factors  DN and stretching         [facility] PT Assessment - 04/29/20 0001      Assessment   Medical Diagnosis  L hip pain    Referring Provider (PT)  Jean Rosenthal, MD    Onset Date/Surgical Date  12/15/18    Hand Dominance  Right    Next MD Visit  PRN    Prior Therapy  known to our clinic from prior therapy for hip      Observation/Other Assessments   Focus on Therapeutic Outcomes (FOTO)   23% limitation       Posture/Postural Control   Posture Comments  improved posture and alignment with good hip extension in standing - no longer flexed forward at hips       AROM   Overall AROM Comments  WFL's bilat hips - some tightness hip extension Lt > Rt       Strength   Right Hip Extension  5/5    Right Hip ABduction  --  5-/5   Left Hip Extension  5/5    Left Hip ABduction  5/5      Flexibility   Hamstrings  WNL bilat    ITB  persistent tightness noted L side with palpable tenderness proximal IT Band and medial hamstring      Palpation   Palpation comment  Tender to palpation Lt piriformis and glut min; glut med                    [facility] Adult PT Treatment/Exercise - 04/29/20 0001      Knee/Hip Exercises: Stretches   Piriformis Stretch  Left;2 reps;30 seconds   supine travell - added diagonal stretches varying angles      Knee/Hip Exercises: Aerobic   Tread Mill  6 min 2.0 mph; walking backward on treadmill slowing speed to focus on gait pattern    backwards 2 min 1.5 to 1.0 better slower pace      Moist Heat Therapy   Number Minutes Moist Heat  12 Minutes    Moist Heat Location  Hip      Manual Therapy   Manual therapy comments  Skilled palpation and monitoring of soft tissues during DN - pt prone    pt sidelying    Joint Mobilization  PA mobs  Lt hip     Soft tissue mobilization  deep tissue work through the Lt posterior hip musculature     Myofascial Release  Lt lateral hip/thigh    Passive ROM  IR/ER pt prone hip extended knee flexed              PT Education - 04/29/20 1009    Education Details  HEP myofacial ball release work    Teacher, music) Educated  Patient    Methods  Explanation;Demonstration;Tactile cues;Verbal cues;Handout    Comprehension  Verbalized understanding;Returned demonstration;Verbal cues required;Tactile cues required          PT Long Term Goals - 04/29/20 0953      PT LONG TERM GOAL #1   Title  Decrease muscular tightness through posterior Lt hip allowing patient to walk and participate in all functional and leisure activities without pain or restrictions    Time  6    Period  Weeks    Status  New    Target Date  06/10/20      PT LONG TERM GOAL #2   Title  The patient will improve L hip abduction strength to 5/5.    Time  6    Period  Weeks    Status  New    Target Date  06/10/20      PT LONG TERM GOAL #3   Title  The patient will report no discomfort with daily activities and recreational activities.    Time  6    Period  Weeks    Status  New    Target Date  06/10/20            Plan - 04/29/20 1005    Clinical Impression Statement  Paitent returns with increased posterior Lt hip pain and discomfort. She has tightness to palpation through the Lt piriformis; glut min; glut med. Responds well ti DN, manual work, myofacial ball relese work, and stretching. Will benefit from a few additional visits to resolve current symptoms. Goals revised.    Rehab Potential  Good    PT Frequency  1x / week    PT Duration  6 weeks    PT Treatment/Interventions  ADLs/Self Care Home Management;Gait training;Stair training;Functional mobility training;Therapeutic activities;Therapeutic exercise;Manual techniques;Dry needling;Taping;Electrical Stimulation;Cryotherapy;Iontophoresis 4mg /ml  Dexamethasone;Moist Heat;Patient/family education    PT Next Visit Plan  continue DN/manual work. Continue hip strengthening, single limb stance with ER + abduction, single limb stance activities and glut strengthening at home and with trainer    PT Home Exercise Plan  Access Code: [email or web address]; HEP    Consulted and Agree with Plan of Care  Patient       Patient will benefit from skilled therapeutic intervention in order to improve the following deficits and impairments:     Visit Diagnosis: Pain in left hip - Plan: PT plan of care cert/re-cert  Muscle weakness (generalized) - Plan: PT plan of care cert/re-cert  Stiffness of left hip, not elsewhere classified - Plan: PT plan of care cert/re-cert     Problem List Patient Active Problem List   Diagnosis Date Noted  . 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 Unilateral primary osteoarthritis, right hip 08/21/2019  . Unilateral primary osteoarthritis, left hip 12/15/2018  . Status post total replacement of left hip 12/15/2018  . Degenerative joint disease (DJD) of hip 06/13/2018  . Osteopenia determined by x-ray 04/20/2018  . Tendonitis involving hip abductors 04/20/2018  . Heart murmur 05/24/2014  . Hyperlipidemia 04/22/2014  . Essential hypertension, benign 04/22/2014    Zaccheaus Storlie 04/24/2014 PT, MPH  04/29/2020, 10:14 AM  [facility] [address] [place], [place], [place] Phone: [phone]   Fax:  [phone]  Name: MARISEL TOSTENSON MRN: [name] Date of Birth: 1951-02-26

## 2020-04-29 NOTE — Patient Instructions (Signed)
Piriformis Stretch    Lying on back, pull right knee toward opposite shoulder. Hold __30__ seconds. Repeat _3-4___ times. Do _1-2___ sessions per day.  vary angles   Ball massage to left posterior hip lying on back

## 2020-05-05 ENCOUNTER — Ambulatory Visit: Payer: Medicare PPO | Admitting: Orthopaedic Surgery

## 2020-05-05 ENCOUNTER — Ambulatory Visit (INDEPENDENT_AMBULATORY_CARE_PROVIDER_SITE_OTHER): Payer: Medicare PPO

## 2020-05-05 ENCOUNTER — Other Ambulatory Visit: Payer: Self-pay

## 2020-05-05 ENCOUNTER — Encounter: Payer: Self-pay | Admitting: Orthopaedic Surgery

## 2020-05-05 DIAGNOSIS — Z96643 Presence of artificial hip joint, bilateral: Secondary | ICD-10-CM | POA: Diagnosis not present

## 2020-05-05 NOTE — Progress Notes (Signed)
The patient is being seen for both of her hips today.  We replaced her left hip in December 2019 and replaced her right hip in September of last year.  These were both done through a direct anterior approach.  She is walking without assistive device.  She is a very active 69 year old female. She reports that her hips are doing well.  She does occasionally have more left hip pain but she points to the sciatic area in the posterior pelvis is source of that pain.  She is very active and has no significant issues.  There has been no change in her medical status or acute issues.  She walks without any assistive device.  On exam I can move both hips smoothly and fluidly with no pain at all.  Her leg lengths are equal.  A low AP pelvis and lateral of both hips shows well-seated total hip arthroplasties with no complicating features.  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  There is no evidence of loosening of either implant.  At this point she can follow-up as needed since she is doing well.  I had a long and thorough discussion with her about the things that we need to bring her back to [place] from a hip standpoint.  All questions and concerns were answered and addressed.

## 2020-05-06 ENCOUNTER — Ambulatory Visit: Payer: Medicare PPO | Admitting: Rehabilitative and Restorative Service Providers"

## 2020-05-06 ENCOUNTER — Encounter: Payer: Self-pay | Admitting: Rehabilitative and Restorative Service Providers"

## 2020-05-06 DIAGNOSIS — M6281 Muscle weakness (generalized): Secondary | ICD-10-CM | POA: Diagnosis not present

## 2020-05-06 DIAGNOSIS — M25652 Stiffness of left hip, not elsewhere classified: Secondary | ICD-10-CM | POA: Diagnosis not present

## 2020-05-06 DIAGNOSIS — M25552 Pain in left hip: Secondary | ICD-10-CM | POA: Diagnosis not present

## 2020-05-06 NOTE — Patient Instructions (Signed)
Adductor stretch in HS stretch position  Deep squat with TB behind knees

## 2020-05-06 NOTE — Therapy (Signed)
[place], [place], [address] Phone: [phone]   Fax:  [phone]  Physical Therapy Treatment  Patient Details  Name: Tanya Cortez MRN: [id number] Date of Birth: 06/05/1951 Referring Provider (PT): Jean Rosenthal, MD   Encounter Date: 05/06/2020  PT End of Session - 05/06/20 1014    Visit Number  11    Number of Visits  16    Date for PT Re-Evaluation  06/10/20    Authorization Type  humana    PT Start Time  1012    PT Stop Time  1100   MH at end of treatment   PT Time Calculation (min)  48 min    Activity Tolerance  Patient tolerated treatment well       Past Medical History:  Diagnosis Date  . Degenerative joint disease (DJD) of hip 06/13/2018  . Essential hypertension, benign 04/22/2014  . History of hip replacement    right  . Hyperlipidemia 04/22/2014  . Osteopenia determined by x-ray 04/20/2018   T score -2.1 on Dexa scan 2017    Past Surgical History:  Procedure Laterality Date  . TONSILLECTOMY    . TOTAL HIP ARTHROPLASTY Left 12/15/2018   Procedure: LEFT TOTAL HIP ARTHROPLASTY ANTERIOR APPROACH;  Surgeon: Mcarthur Rossetti, MD;  Location: [facility];  Service: Orthopedics;  Laterality: Left;  . TOTAL HIP ARTHROPLASTY Right 09/21/2019   Procedure: RIGHT TOTAL HIP ARTHROPLASTY ANTERIOR APPROACH;  Surgeon: Mcarthur Rossetti, MD;  Location: [facility];  Service: Orthopedics;  Laterality: Right;    There were no vitals filed for this visit.  Subjective Assessment - 05/06/20 1014    Subjective  did great with DN last visit. Having some discomfort in the lateral hip and upper thigh    Currently in Pain?  No/denies    Pain Score  0-No pain    Pain Location  Hip    Pain Orientation  Left    Pain Descriptors / Indicators  Tightness;Sore    Pain Type  Chronic pain                       [facility] Adult PT Treatment/Exercise - 05/06/20 0001      Knee/Hip  Exercises: Standing   Forward Lunges  Both;5 reps   deep squat with black TB around knees UE support    Lateral Step Up  Right;10 reps   heel tap with Rt slow eccentric control with lowering    Wall Squat  5 reps   20 sec hold - deep squat position for stretch      Moist Heat Therapy   Number Minutes Moist Heat  12 Minutes    Moist Heat Location  Hip   lateral thigh      Manual Therapy   Manual therapy comments  Skilled palpation and monitoring of soft tissues during DN - pt prone    pt sidelying    Joint Mobilization  AP mobs Lt hip     Soft tissue mobilization  deep tissue work through the Lt anterior lateral hip musculature     Myofascial Release  Lt lateral hip/thigh       Trigger Point Dry Needling - 05/06/20 0001    Consent Given?  Yes    Education Handout Provided  Previously provided    Dry Needling Comments  left    Electrical Stimulation Performed with Dry Needling  Yes    Vastus lateralis Response  Palpable  increased muscle length    Tensor Fascia Lata Response  Palpable increased muscle length           PT Education - 05/06/20 1057    Education Details  HEP    Person(s) Educated  Patient    Methods  Explanation;Demonstration;Tactile cues;Verbal cues    Comprehension  Verbalized understanding;Returned demonstration;Verbal cues required;Tactile cues required          PT Long Term Goals - 04/29/20 0953      PT LONG TERM GOAL #1   Title  Decrease muscular tightness through posterior Lt hip allowing patient to walk and participate in all functional and leisure activities without pain or restrictions    Time  6    Period  Weeks    Status  New    Target Date  06/10/20      PT LONG TERM GOAL #2   Title  The patient will improve L hip abduction strength to 5/5.    Time  6    Period  Weeks    Status  New    Target Date  06/10/20      PT LONG TERM GOAL #3   Title  The patient will report no discomfort with daily activities and recreational activities.     Time  6    Period  Weeks    Status  New    Target Date  06/10/20            Plan - 05/06/20 1057    Clinical Impression Statement  Good improvement through the posterior Lt hip. Tightness noted in the Lt lateral quad/TFL/ITB. good response to stretching; DN, manual work. Added strenghtening.    Rehab Potential  Good    PT Frequency  1x / week    PT Duration  6 weeks    PT Treatment/Interventions  ADLs/Self Care Home Management;Gait training;Stair training;Functional mobility training;Therapeutic activities;Therapeutic exercise;Manual techniques;Dry needling;Taping;Electrical Stimulation;Cryotherapy;Iontophoresis 4mg /ml Dexamethasone;Moist Heat;Patient/family education    PT Next Visit Plan  continue DN/manual work. Continue hip strengthening, single limb stance with ER + abduction, single limb stance activities and glut strengthening at home and with trainer    PT Home Exercise Plan  Access Code: ; HEP    Consulted and Agree with Plan of Care  Patient       Patient will benefit from skilled therapeutic intervention in order to improve the following deficits and impairments:     Visit Diagnosis: Pain in left hip  Muscle weakness (generalized)  Stiffness of left hip, not elsewhere classified     Problem List Patient Active Problem List   Diagnosis Date Noted  . 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 Unilateral primary osteoarthritis, right hip 08/21/2019  . Unilateral primary osteoarthritis, left hip 12/15/2018  . Status post total replacement of left hip 12/15/2018  . Degenerative joint disease (DJD) of hip 06/13/2018  . Osteopenia determined by x-ray 04/20/2018  . Tendonitis involving hip abductors 04/20/2018  . Heart murmur 05/24/2014  . Hyperlipidemia 04/22/2014  . Essential hypertension, benign 04/22/2014    Ashlin Kreps 04/24/2014 PT, MPH  05/06/2020, 11:01 AM  [facility] [address]  [address] [place], [place], [place] Phone: [phone]   Fax:  [phone]  Name: JON KASPAREK MRN: [name] Date of Birth: 09/04/1951

## 2020-05-13 ENCOUNTER — Encounter: Payer: Self-pay | Admitting: Rehabilitative and Restorative Service Providers"

## 2020-05-13 ENCOUNTER — Other Ambulatory Visit: Payer: Self-pay

## 2020-05-13 ENCOUNTER — Ambulatory Visit: Payer: Medicare PPO | Admitting: Rehabilitative and Restorative Service Providers"

## 2020-05-13 DIAGNOSIS — M6281 Muscle weakness (generalized): Secondary | ICD-10-CM

## 2020-05-13 DIAGNOSIS — M25552 Pain in left hip: Secondary | ICD-10-CM | POA: Diagnosis not present

## 2020-05-13 DIAGNOSIS — M25652 Stiffness of left hip, not elsewhere classified: Secondary | ICD-10-CM | POA: Diagnosis not present

## 2020-05-13 NOTE — Patient Instructions (Signed)
Side steps to each side - can use theraband above the knees    Standing on edge of step one foot off the step keep knees straight and drop hip slightly  Pause and lift hip level again.

## 2020-05-13 NOTE — Therapy (Signed)
[facility] [address] [place], [place], [address] Phone: [phone]   Fax:  [phone]  Physical Therapy Treatment  Patient Details  Name: Tanya Cortez MRN: [id number] Date of Birth: February 10, 1951 Referring Provider (PT): Doneen Poisson, MD   Encounter Date: 05/13/2020  PT End of Session - 05/13/20 1016    Visit Number  12    Number of Visits  16    Date for PT Re-Evaluation  06/10/20    Authorization Type  humana    PT Start Time  1015    PT Stop Time  1114    PT Time Calculation (min)  59 min    Activity Tolerance  Patient tolerated treatment well       Past Medical History:  Diagnosis Date  . Degenerative joint disease (DJD) of hip 06/13/2018  . Essential hypertension, benign 04/22/2014  . History of hip replacement    right  . Hyperlipidemia 04/22/2014  . Osteopenia determined by x-ray 04/20/2018   T score -2.1 on Dexa scan 2017    Past Surgical History:  Procedure Laterality Date  . TONSILLECTOMY    . TOTAL HIP ARTHROPLASTY Left 12/15/2018   Procedure: LEFT TOTAL HIP ARTHROPLASTY ANTERIOR APPROACH;  Surgeon: Kathryne Hitch, MD;  Location: [facility];  Service: Orthopedics;  Laterality: Left;  . TOTAL HIP ARTHROPLASTY Right 09/21/2019   Procedure: RIGHT TOTAL HIP ARTHROPLASTY ANTERIOR APPROACH;  Surgeon: Kathryne Hitch, MD;  Location: [facility];  Service: Orthopedics;  Laterality: Right;    There were no vitals filed for this visit.  Subjective Assessment - 05/13/20 1016    Subjective  Still feels a "little place in the side of the hip" that is still tight. Still working on her stretches at home.    Currently in Pain?  No/denies         [facility] PT Assessment - 05/13/20 0001      Assessment   Medical Diagnosis  L hip pain    Referring Provider (PT)  Doneen Poisson, MD    Onset Date/Surgical Date  12/15/18    Hand Dominance  Right    Next MD Visit  PRN      AROM    Overall AROM Comments  WFL's bilat hips - some tightness hip extension Lt > Rt       Strength   Right Hip ABduction  --   5-/5     Flexibility   Hamstrings  WNL bilat    ITB  persistent tightness noted L side with palpable tenderness proximal IT Band and medial hamstring      Palpation   Palpation comment  Tender to palpation Lt TFL/ITB; piriformis and glut min; glut med                     [facility] Adult PT Treatment/Exercise - 05/13/20 0001      Knee/Hip Exercises: Stretches   ITB Stretch  Left;Right;3 reps;30 seconds      Knee/Hip Exercises: Aerobic   Tread Mill  5 min x  2.0 mph 5 min walking backward on treadmill slowing speed to focus on gait pattern       Knee/Hip Exercises: Standing   Other Standing Knee Exercises  hip hike on step x 10 reps each LE ; swing for ler    Other Standing Knee Exercises  side steps with and without green TB x 20 ft x 8 reps each side  Moist Heat Therapy   Number Minutes Moist Heat  12 Minutes    Moist Heat Location  Hip   lateral thigh      Electrical Stimulation   Electrical Stimulation Location  Lt TFL/ITB    Electrical Stimulation Action  IFC    Electrical Stimulation Parameters  to tolerance     Electrical Stimulation Goals  Tone;Pain      Manual Therapy   Manual therapy comments  Skilled palpation and monitoring of soft tissues during DN - pt prone    pt sidelying    Soft tissue mobilization  deep tissue work through the Lt lateral hip musculature     Myofascial Release  Lt lateral hip/thigh       Trigger Point Dry Needling - 05/13/20 0001    Consent Given?  Yes    Education Handout Provided  Previously provided    Dry Needling Comments  left    Electrical Stimulation Performed with Dry Needling  Yes    Tensor Fascia Lata Response  Palpable increased muscle length           PT Education - 05/13/20 1035    Education Details  HEP    Person(s) Educated  Patient    Methods   Explanation;Demonstration;Tactile cues;Verbal cues;Handout    Comprehension  Verbalized understanding;Returned demonstration;Verbal cues required;Tactile cues required          PT Long Term Goals - 04/29/20 0953      PT LONG TERM GOAL #1   Title  Decrease muscular tightness through posterior Lt hip allowing patient to walk and participate in all functional and leisure activities without pain or restrictions    Time  6    Period  Weeks    Status  New    Target Date  06/10/20      PT LONG TERM GOAL #2   Title  The patient will improve L hip abduction strength to 5/5.    Time  6    Period  Weeks    Status  New    Target Date  06/10/20      PT LONG TERM GOAL #3   Title  The patient will report no discomfort with daily activities and recreational activities.    Time  6    Period  Weeks    Status  New    Target Date  06/10/20            Plan - 05/13/20 1049    Clinical Impression Statement  Continued improvement. Tightness noted in the TFL and ITB today. Good response to DN and manual work. Continued with modalities post treatment to decrease soreness following exercise and DN.    Rehab Potential  Good    PT Frequency  1x / week    PT Duration  6 weeks    PT Treatment/Interventions  ADLs/Self Care Home Management;Gait training;Stair training;Functional mobility training;Therapeutic activities;Therapeutic exercise;Manual techniques;Dry needling;Taping;Electrical Stimulation;Cryotherapy;Iontophoresis 4mg /ml Dexamethasone;Moist Heat;Patient/family education    PT Next Visit Plan  continue DN/manual work. Continue hip strengthening, single limb stance with ER + abduction, single limb stance activities and glut strengthening at home and with trainer    PT Home Exercise Plan  Access Code: [email or web address]; HEP    Consulted and Agree with Plan of Care  Patient       Patient will benefit from skilled therapeutic intervention in order to improve the following deficits and impairments:      Visit Diagnosis: Pain in left hip  Muscle weakness (  generalized)  Stiffness of left hip, not elsewhere classified     Problem List Patient Active Problem List   Diagnosis Date Noted  . 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 Unilateral primary osteoarthritis, right hip 08/21/2019  . Unilateral primary osteoarthritis, left hip 12/15/2018  . Status post total replacement of left hip 12/15/2018  . Degenerative joint disease (DJD) of hip 06/13/2018  . Osteopenia determined by x-ray 04/20/2018  . Tendonitis involving hip abductors 04/20/2018  . Heart murmur 05/24/2014  . Hyperlipidemia 04/22/2014  . Essential hypertension, benign 04/22/2014    Eller Sweis Rober Minion PT, MPH  05/13/2020, 11:02 AM  [facility] [address] [place], [place], [address] Phone: [phone]   Fax:  [phone]  Name: Tanya Cortez MRN: [id number] Date of Birth: 1951/12/05

## 2020-05-27 ENCOUNTER — Encounter: Payer: Medicare PPO | Admitting: Rehabilitative and Restorative Service Providers"

## 2020-05-30 ENCOUNTER — Other Ambulatory Visit: Payer: Self-pay

## 2020-05-30 ENCOUNTER — Ambulatory Visit: Payer: Medicare PPO | Admitting: Rehabilitative and Restorative Service Providers"

## 2020-05-30 ENCOUNTER — Encounter: Payer: Self-pay | Admitting: Rehabilitative and Restorative Service Providers"

## 2020-05-30 DIAGNOSIS — M25652 Stiffness of left hip, not elsewhere classified: Secondary | ICD-10-CM

## 2020-05-30 DIAGNOSIS — M25552 Pain in left hip: Secondary | ICD-10-CM | POA: Diagnosis not present

## 2020-05-30 DIAGNOSIS — M6281 Muscle weakness (generalized): Secondary | ICD-10-CM

## 2020-05-30 NOTE — Therapy (Signed)
[facility] [place] [address] [place], [place], [address] Phone: [phone]   Fax:  [phone]  Physical Therapy Treatment  Patient Details  Name: Tanya Cortez MRN: [id number] Date of Birth: 03/14/1951 Referring Provider (PT): Doneen Poisson, MD   Encounter Date: 05/30/2020  PT End of Session - 05/30/20 1439    Visit Number  13    Number of Visits  16    Date for PT Re-Evaluation  06/10/20    PT Start Time  1439    PT Stop Time  1528    PT Time Calculation (min)  49 min    Activity Tolerance  Patient tolerated treatment well       Past Medical History:  Diagnosis Date   Degenerative joint disease (DJD) of hip 06/13/2018   Essential hypertension, benign 04/22/2014   History of hip replacement    right   Hyperlipidemia 04/22/2014   Osteopenia determined by x-ray 04/20/2018   T score -2.1 on Dexa scan 2017    Past Surgical History:  Procedure Laterality Date   TONSILLECTOMY     TOTAL HIP ARTHROPLASTY Left 12/15/2018   Procedure: LEFT TOTAL HIP ARTHROPLASTY ANTERIOR APPROACH;  Surgeon: Kathryne Hitch, MD;  Location: [facility];  Service: Orthopedics;  Laterality: Left;   TOTAL HIP ARTHROPLASTY Right 09/21/2019   Procedure: RIGHT TOTAL HIP ARTHROPLASTY ANTERIOR APPROACH;  Surgeon: Kathryne Hitch, MD;  Location: [facility];  Service: Orthopedics;  Laterality: Right;    There were no vitals filed for this visit.  Subjective Assessment - 05/30/20 1439    Subjective  Patient reports that the lt hip is feeling better. It is "just tight" Went for a massage after last PT visit and the hip felt the best it has felt. Determined to "make it happen and get the hip right"    Currently in Pain?  No/denies   tightness no soreness        [facility] PT Assessment - 05/30/20 0001      Assessment   Medical Diagnosis  L hip pain    Referring Provider (PT)  Doneen Poisson, MD    Onset Date/Surgical Date   12/15/18    Hand Dominance  Right    Next MD Visit  PRN      Strength   Right Hip ABduction  --   5-/5     Flexibility   Hamstrings  WNL bilat    ITB  improving tightness noted L side with palpable tenderness proximal IT Band and medial hamstring      Palpation   Palpation comment  Tender to palpation Lt TFL/ITB; piriformis and glut min; glut med                     [facility] Adult PT Treatment/Exercise - 05/30/20 0001      Knee/Hip Exercises: Stretches   ITB Stretch  Left;Right;3 reps;30 seconds      Knee/Hip Exercises: Aerobic   Tread Mill  5 min x  2.0 mph 5 min walking backward on treadmill slowing speed to focus on gait pattern    VC for bigger step backward on the treadmill      Knee/Hip Exercises: Standing   Other Standing Knee Exercises  side steps with and without blue TB x 20 ft x 8 reps each side       Moist Heat Therapy   Number Minutes Moist Heat  15 Minutes    Moist Heat Location  Hip  lateral thigh      Acupuncturist Location  Lt TFL/ITB    Electrical Stimulation Action  IFC    Electrical Stimulation Parameters  to tolerance     Electrical Stimulation Goals  Tone;Pain      Manual Therapy   Manual therapy comments  Skilled palpation and monitoring of soft tissues during DN - pt prone    pt sidelying    Soft tissue mobilization  deep tissue work through the Lt lateral hip musculature     Myofascial Release  Lt lateral hip/thigh                  PT Long Term Goals - 04/29/20 0953      PT LONG TERM GOAL #1   Title  Decrease muscular tightness through posterior Lt hip allowing patient to walk and participate in all functional and leisure activities without pain or restrictions    Time  6    Period  Weeks    Status  New    Target Date  06/10/20      PT LONG TERM GOAL #2   Title  The patient will improve L hip abduction strength to 5/5.    Time  6    Period  Weeks    Status  New    Target Date   06/10/20      PT LONG TERM GOAL #3   Title  The patient will report no discomfort with daily activities and recreational activities.    Time  6    Period  Weeks    Status  New    Target Date  06/10/20            Plan - 05/30/20 1447    Clinical Impression Statement  Persistent tightness anterior lateral Lt hip. Good response to DN/manual work. Patient is deligent with stretches at home. Progress continues.    Rehab Potential  Good    PT Frequency  1x / week    PT Duration  6 weeks    PT Treatment/Interventions  ADLs/Self Care Home Management;Gait training;Stair training;Functional mobility training;Therapeutic activities;Therapeutic exercise;Manual techniques;Dry needling;Taping;Electrical Stimulation;Cryotherapy;Iontophoresis 4mg /ml Dexamethasone;Moist Heat;Patient/family education    PT Next Visit Plan  continue DN/manual work. Continue hip strengthening, single limb stance with ER + abduction, single limb stance activities and glut strengthening at home and with trainer    PT Home Exercise Plan  Access Code: [email or web address]; [email or web address]    Consulted and Agree with Plan of Care  Patient       Patient will benefit from skilled therapeutic intervention in order to improve the following deficits and impairments:     Visit Diagnosis: Pain in left hip  Muscle weakness (generalized)  Stiffness of left hip, not elsewhere classified     Problem List Patient Active Problem List   Diagnosis Date Noted   Status post total replacement of right hip 09/21/2019   Unilateral primary osteoarthritis, right hip 08/21/2019   Unilateral primary osteoarthritis, left hip 12/15/2018   Status post total replacement of left hip 12/15/2018   Degenerative joint disease (DJD) of hip 06/13/2018   Osteopenia determined by x-ray 04/20/2018   Tendonitis involving hip abductors 04/20/2018   Heart murmur 05/24/2014   Hyperlipidemia 04/22/2014   Essential hypertension, benign 04/22/2014    Tanya Cortez PT, MPH  05/30/2020, 3:28 PM  [facility] [address] [place], [place], [address] Phone: [phone]   Fax:  [phone]  Name: Tanya Cortez MRN: [id number] Date of Birth: 03/05/1951

## 2020-06-04 ENCOUNTER — Encounter: Payer: Medicare PPO | Admitting: Rehabilitative and Restorative Service Providers"

## 2020-06-11 ENCOUNTER — Ambulatory Visit: Payer: Medicare PPO | Admitting: Rehabilitative and Restorative Service Providers"

## 2020-06-11 ENCOUNTER — Encounter: Payer: Self-pay | Admitting: Rehabilitative and Restorative Service Providers"

## 2020-06-11 ENCOUNTER — Other Ambulatory Visit: Payer: Self-pay

## 2020-06-11 DIAGNOSIS — M25652 Stiffness of left hip, not elsewhere classified: Secondary | ICD-10-CM | POA: Diagnosis not present

## 2020-06-11 DIAGNOSIS — M25552 Pain in left hip: Secondary | ICD-10-CM | POA: Diagnosis not present

## 2020-06-11 DIAGNOSIS — M6281 Muscle weakness (generalized): Secondary | ICD-10-CM | POA: Diagnosis not present

## 2020-06-11 NOTE — Therapy (Signed)
[facility] [place] [address] [place], [place], [address] Phone: [phone]   Fax:  [phone]  Physical Therapy Treatment  Patient Details  Name: Tanya Cortez MRN: [id number] Date of Birth: [birth year] Referring Provider (PT): Doneen Poisson, MD   Encounter Date: 06/11/2020   PT End of Session - 06/11/20 1026    Visit Number 14    Number of Visits 16    Date for PT Re-Evaluation 06/10/20    PT Start Time 1015    PT Stop Time 1104    PT Time Calculation (min) 49 min    Activity Tolerance Patient tolerated treatment well           Past Medical History:  Diagnosis Date  . Degenerative joint disease (DJD) of hip 06/13/2018  . Essential hypertension, benign 04/22/2014  . History of hip replacement    right  . Hyperlipidemia 04/22/2014  . Osteopenia determined by x-ray 04/20/2018   T score -2.1 on Dexa scan 2017    Past Surgical History:  Procedure Laterality Date  . TONSILLECTOMY    . TOTAL HIP ARTHROPLASTY Left 12/15/2018   Procedure: LEFT TOTAL HIP ARTHROPLASTY ANTERIOR APPROACH;  Surgeon: Kathryne Hitch, MD;  Location: [facility];  Service: Orthopedics;  Laterality: Left;  . TOTAL HIP ARTHROPLASTY Right 09/21/2019   Procedure: RIGHT TOTAL HIP ARTHROPLASTY ANTERIOR APPROACH;  Surgeon: Kathryne Hitch, MD;  Location: [facility];  Service: Orthopedics;  Laterality: Right;    There were no vitals filed for this visit.   Subjective Assessment - 06/11/20 1033    Subjective Tightness in the Lt lateral hip - not much pain just tight. Notices the tightness with walking and functional activities    Currently in Pain? No/denies              [facility] PT Assessment - 06/11/20 0001      Assessment   Medical Diagnosis L hip pain    Referring Provider (PT) Doneen Poisson, MD    Onset Date/Surgical Date 12/15/18    Hand Dominance Right    Next MD Visit PRN      Strength   Right Hip ABduction --    5-/5     Flexibility   Hamstrings WNL bilat    ITB improving tightness noted L side with palpable tenderness proximal IT Band and medial hamstring      Palpation   Palpation comment Tender to palpation Lt TFL/ITB; piriformis and glut min; glut med                          [facility] Adult PT Treatment/Exercise - 06/11/20 0001      Knee/Hip Exercises: Stretches   Hip Flexor Stretch Right;Left;3 reps;30 seconds    Hip Flexor Stretch Limitations high kneel - varying angle for last stretch     ITB Stretch Left;Right;3 reps;30 seconds    Piriformis Stretch Left;2 reps;30 seconds      Knee/Hip Exercises: Aerobic   Tread Mill 5 min x  2.0 mph 5 min walking backward on treadmill slowing speed to focus on gait pattern    VC for bigger step backward on the treadmill      Knee/Hip Exercises: Standing   Other Standing Knee Exercises side steps with and without blue TB x 20 ft x 8 reps each side       Moist Heat Therapy   Number Minutes Moist Heat 15 Minutes    Moist Heat  Location Hip   lateral thigh      Electrical Stimulation   Electrical Stimulation Location Lt TFL/ITB    Electrical Stimulation Action IFC    Electrical Stimulation Parameters to tolerance     Electrical Stimulation Goals Tone;Pain      Manual Therapy   Manual therapy comments Skilled palpation and monitoring of soft tissues during DN - pt prone    pt sidelying    Soft tissue mobilization deep tissue work through the Lt lateral hip musculature     Myofascial Release Lt lateral hip/thigh                  PT Education - 06/11/20 1058    Education Details scar massage    Person(s) Educated Patient    Methods Explanation;Demonstration;Tactile cues;Verbal cues;Handout    Comprehension Verbalized understanding;Returned demonstration;Verbal cues required;Tactile cues required               PT Long Term Goals - 04/29/20 0953      PT LONG TERM GOAL #1   Title Decrease muscular tightness through  posterior Lt hip allowing patient to walk and participate in all functional and leisure activities without pain or restrictions    Time 6    Period Weeks    Status New    Target Date 06/10/20      PT LONG TERM GOAL #2   Title The patient will improve L hip abduction strength to 5/5.    Time 6    Period Weeks    Status New    Target Date 06/10/20      PT LONG TERM GOAL #3   Title The patient will report no discomfort with daily activities and recreational activities.    Time 6    Period Weeks    Status New    Target Date 06/10/20                 Plan - 06/11/20 1059    Clinical Impression Statement Patient reports continued improvement in Lateral Lt hip. Excellent response to DN and manual work with pateint's HEP and walking.    Rehab Potential Good    PT Frequency 1x / week    PT Duration 6 weeks    PT Treatment/Interventions ADLs/Self Care Home Management;Gait training;Stair training;Functional mobility training;Therapeutic activities;Therapeutic exercise;Manual techniques;Dry needling;Taping;Electrical Stimulation;Cryotherapy;Iontophoresis 4mg /ml Dexamethasone;Moist Heat;Patient/family education    PT Next Visit Plan continue DN/manual work. Continue hip strengthening, single limb stance with ER + abduction, single limb stance activities and glut strengthening at home and with trainer - patient will be on hold x 4 weeks - she will call with [employer] or if she needs to reschedule    PT Home Exercise Plan Access Code: [email or web address]; [email or web address]    Consulted and Agree with Plan of Care Patient           Patient will benefit from skilled therapeutic intervention in order to improve the following deficits and impairments:     Visit Diagnosis: Pain in left hip  Muscle weakness (generalized)  Stiffness of left hip, not elsewhere classified     Problem List Patient Active Problem List   Diagnosis Date Noted  . 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  Unilateral primary osteoarthritis, right hip 08/21/2019  . Unilateral primary osteoarthritis, left hip 12/15/2018  . Status post total replacement of left hip 12/15/2018  . Degenerative joint disease (DJD) of hip 06/13/2018  . Osteopenia determined by x-ray 04/20/2018  .  Tendonitis involving hip abductors 04/20/2018  . Heart murmur 05/24/2014  . Hyperlipidemia 04/22/2014  . Essential hypertension, benign 04/22/2014    Darius Fillingim Rober Minion PT, MPH  06/11/2020, 11:03 AM  [facility] [address] [place], [place], [address] Phone: [phone]   Fax:  [phone]  Name: Tanya Cortez MRN: [id number] Date of Birth: 04/06/1951

## 2020-06-11 NOTE — Patient Instructions (Signed)
Scar massage

## 2020-06-24 ENCOUNTER — Encounter: Payer: Self-pay | Admitting: Rehabilitative and Restorative Service Providers"

## 2020-06-24 ENCOUNTER — Other Ambulatory Visit: Payer: Self-pay

## 2020-06-24 ENCOUNTER — Ambulatory Visit: Payer: Medicare PPO | Admitting: Rehabilitative and Restorative Service Providers"

## 2020-06-24 DIAGNOSIS — M6281 Muscle weakness (generalized): Secondary | ICD-10-CM | POA: Diagnosis not present

## 2020-06-24 DIAGNOSIS — M25552 Pain in left hip: Secondary | ICD-10-CM | POA: Diagnosis not present

## 2020-06-24 DIAGNOSIS — M25652 Stiffness of left hip, not elsewhere classified: Secondary | ICD-10-CM | POA: Diagnosis not present

## 2020-06-24 NOTE — Therapy (Addendum)
[place] [facility] [place], [place], [address] Phone: [phone]   Fax:  [phone]  Physical Therapy Treatment  Patient Details  Name: Tanya Cortez MRN: [id number] Date of Birth: August 07, 1951 Referring Provider (PT): Jean Rosenthal, MD   Encounter Date: 06/24/2020   PT End of Session - 06/24/20 0929    Visit Number 15    Number of Visits 16    PT Start Time 0928    PT Stop Time 1015    PT Time Calculation (min) 47 min    Activity Tolerance Patient tolerated treatment well           Past Medical History:  Diagnosis Date  . Degenerative joint disease (DJD) of hip 06/13/2018  . Essential hypertension, benign 04/22/2014  . History of hip replacement    right  . Hyperlipidemia 04/22/2014  . Osteopenia determined by x-ray 04/20/2018   T score -2.1 on Dexa scan 2017    Past Surgical History:  Procedure Laterality Date  . TONSILLECTOMY    . TOTAL HIP ARTHROPLASTY Left 12/15/2018   Procedure: LEFT TOTAL HIP ARTHROPLASTY ANTERIOR APPROACH;  Surgeon: Mcarthur Rossetti, MD;  Location: [facility];  Service: Orthopedics;  Laterality: Left;  . TOTAL HIP ARTHROPLASTY Right 09/21/2019   Procedure: RIGHT TOTAL HIP ARTHROPLASTY ANTERIOR APPROACH;  Surgeon: Mcarthur Rossetti, MD;  Location: [facility];  Service: Orthopedics;  Laterality: Right;    There were no vitals filed for this visit.   Subjective Assessment - 06/24/20 0934    Subjective Continued improvement. Notices tightness. No pain. Wants to resolve that tightness if she can. Pleased with progress - just feels she needs to get rid of that left over tightness so it doesn't tighten up again.    Currently in Pain? No/denies              [facility] PT Assessment - 06/24/20 0001      Assessment   Medical Diagnosis L hip pain    Referring Provider (PT) Jean Rosenthal, MD    Onset Date/Surgical Date 12/15/18    Hand Dominance Right    Next MD  Visit PRN      AROM   Overall AROM Comments WFL's bilat hips - minimal tightness hip extension Lt > Rt       Strength   Overall Strength Comments 5/5 bilat LE's       Flexibility   Hamstrings WNL bilat    ITB improving tightness noted L side with palpable tenderness proximal IT Band and medial hamstring      Palpation   Palpation comment Tender to palpation Lt TFL/ITB                         [facility] Adult PT Treatment/Exercise - 06/24/20 0001      Knee/Hip Exercises: Stretches   Hip Flexor Stretch Right;Left;3 reps;30 seconds    Hip Flexor Stretch Limitations high kneel - varying angle for last stretch     ITB Stretch Left;Right;3 reps;30 seconds    Piriformis Stretch Left;2 reps;30 seconds      Knee/Hip Exercises: Aerobic   Tread Mill 5 min x  2.0 mph 5 min walking backward on treadmill slowing speed to focus on gait pattern    VC for bigger step backward on the treadmill      Knee/Hip Exercises: Standing   Other Standing Knee Exercises side steps with and without blue TB x 20 ft x 8  reps each side       Moist Heat Therapy   Number Minutes Moist Heat 15 Minutes    Moist Heat Location Hip   lateral thigh      Electrical Stimulation   Electrical Stimulation Location Lt TFL/ITB    Electrical Stimulation Action IFC    Electrical Stimulation Parameters to tolerance    Electrical Stimulation Goals Tone;Pain      Manual Therapy   Manual therapy comments Skilled palpation and monitoring of soft tissues during DN - pt prone    pt sidelying    Soft tissue mobilization deep tissue work through the Lt lateral hip musculature     Myofascial Release Lt lateral hip/thigh            Trigger Point Dry Needling - 06/24/20 0001    Consent Given? Yes    Education Handout Provided Previously provided    Dry Needling Comments left    Electrical Stimulation Performed with Dry Needling Yes    Vastus lateralis Response Palpable increased muscle length    Tensor Fascia Lata  Response Palpable increased muscle length                     PT Long Term Goals - 06/24/20 0959      PT LONG TERM GOAL #1   Title Decrease muscular tightness through posterior Lt hip allowing patient to walk and participate in all functional and leisure activities without pain or restrictions    Time 4    Period Weeks    Status Revised    Target Date 07/22/20      PT LONG TERM GOAL #2   Title The patient will improve L hip abduction strength to 5/5.    Time 6    Period Weeks    Status Achieved      PT LONG TERM GOAL #3   Title The patient will report no discomfort with daily activities and recreational activities.    Time 4    Period Weeks    Status Revised    Target Date 07/22/20                 Plan - 06/24/20 0956    Clinical Impression Statement Continued excelletn progress with good response to exercise; DN; manual work; HEP. Patient demonstrates 5/5 strength and good ROM. Gait is improved and patient is tolerating more functional and recreational activities. Patient will benefit from 1-2 more visits to fully resolve tissue tightness.    Rehab Potential Good    PT Frequency 1x / week    PT Duration 4 weeks    PT Treatment/Interventions ADLs/Self Care Home Management;Gait training;Stair training;Functional mobility training;Therapeutic activities;Therapeutic exercise;Manual techniques;Dry needling;Taping;Electrical Stimulation;Cryotherapy;Iontophoresis 41m/ml Dexamethasone;Moist Heat;Patient/family education    PT Next Visit Plan continue DN/manual work. Continue hip strengthening, single limb stance with ER + abduction, single limb stance activities and glut strengthening at home and with trainer    PT Home Exercise Plan Access Code: [email or web address]; HEP    Consulted and Agree with Plan of Care Patient           Patient will benefit from skilled therapeutic intervention in order to improve the following deficits and impairments:     Visit Diagnosis: Pain  in left hip - Plan: PT plan of care cert/re-cert  Muscle weakness (generalized) - Plan: PT plan of care cert/re-cert  Stiffness of left hip, not elsewhere classified - Plan: PT plan of care cert/re-cert     Problem  List Patient Active Problem List   Diagnosis Date Noted  . 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 Unilateral primary osteoarthritis, right hip 08/21/2019  . Unilateral primary osteoarthritis, left hip 12/15/2018  . Status post total replacement of left hip 12/15/2018  . Degenerative joint disease (DJD) of hip 06/13/2018  . Osteopenia determined by x-ray 04/20/2018  . Tendonitis involving hip abductors 04/20/2018  . Heart murmur 05/24/2014  . Hyperlipidemia 04/22/2014  . Essential hypertension, benign 04/22/2014    Pearle Wandler Nilda Simmer PT, MPH  06/24/2020, 10:17 AM  [facility] [place], [place], [address] Phone: [phone]   Fax:  [phone]  Name: Tanya Cortez MRN: [id number] Date of Birth: 16-Jun-1951  PHYSICAL THERAPY DISCHARGE SUMMARY  Visits from Start of Care: 15  Current functional level related to goals / functional outcomes: See progress note for discharge status    Remaining deficits: No known deficits    Education / Equipment: HEP  Plan: Patient agrees to discharge.  Patient goals were met. Patient is being discharged due to meeting the stated rehab goals.  ?????     Pahola Dimmitt P. Helene Kelp PT, MPH 08/04/20 8:46 AM

## 2020-07-03 ENCOUNTER — Encounter: Payer: Medicare PPO | Admitting: Rehabilitative and Restorative Service Providers"

## 2020-07-09 ENCOUNTER — Encounter: Payer: Medicare PPO | Admitting: Rehabilitative and Restorative Service Providers"

## 2020-07-11 ENCOUNTER — Encounter: Payer: Medicare PPO | Admitting: Rehabilitative and Restorative Service Providers"

## 2020-08-12 ENCOUNTER — Ambulatory Visit: Payer: Medicare PPO | Admitting: Family Medicine

## 2020-08-12 ENCOUNTER — Encounter: Payer: Self-pay | Admitting: Family Medicine

## 2020-08-12 VITALS — BP 122/59 | HR 84 | Ht 64.0 in | Wt 139.0 lb

## 2020-08-12 DIAGNOSIS — D649 Anemia, unspecified: Secondary | ICD-10-CM

## 2020-08-12 DIAGNOSIS — M8580 Other specified disorders of bone density and structure, unspecified site: Secondary | ICD-10-CM | POA: Diagnosis not present

## 2020-08-12 DIAGNOSIS — E7800 Pure hypercholesterolemia, unspecified: Secondary | ICD-10-CM

## 2020-08-12 DIAGNOSIS — Z8679 Personal history of other diseases of the circulatory system: Secondary | ICD-10-CM | POA: Diagnosis not present

## 2020-08-12 LAB — CBC
HCT: 43.1 % (ref 35.0–45.0)
Hemoglobin: 14.3 g/dL (ref 11.7–15.5)
MCH: 30.9 pg (ref 27.0–33.0)
MCHC: 33.2 g/dL (ref 32.0–36.0)
MCV: 93.1 fL (ref 80.0–100.0)
MPV: 11.3 fL (ref 7.5–12.5)
Platelets: 247 10*3/uL (ref 140–400)
RBC: 4.63 10*6/uL (ref 3.80–5.10)
RDW: 12.1 % (ref 11.0–15.0)
WBC: 5.7 10*3/uL (ref 3.8–10.8)

## 2020-08-12 LAB — LIPID PANEL
Cholesterol: 217 mg/dL — ABNORMAL HIGH (ref <200)
HDL: 89 mg/dL (ref >=50)
LDL Cholesterol (Calc): 114 mg/dL (calc) — ABNORMAL HIGH
Non-HDL Cholesterol (Calc): 128 mg/dL (calc) (ref <130)
Total CHOL/HDL Ratio: 2.4 (calc) (ref <5.0)
Triglycerides: 54 mg/dL (ref <150)

## 2020-08-12 LAB — COMPLETE METABOLIC PANEL WITH GFR
AG Ratio: 2.6 (calc) — ABNORMAL HIGH (ref 1.0–2.5)
ALT: 13 U/L (ref 6–29)
AST: 20 U/L (ref 10–35)
Albumin: 4.9 g/dL (ref 3.6–5.1)
Alkaline phosphatase (APISO): 78 U/L (ref 37–153)
BUN: 23 mg/dL (ref 7–25)
CO2: 32 mmol/L (ref 20–32)
Calcium: 10.2 mg/dL (ref 8.6–10.4)
Chloride: 102 mmol/L (ref 98–110)
Creat: 0.85 mg/dL (ref 0.60–0.93)
GFR, Est African American: 80 mL/min/{1.73_m2} (ref >=60)
GFR, Est Non African American: 69 mL/min/{1.73_m2} (ref >=60)
Globulin: 1.9 g/dL (calc) (ref 1.9–3.7)
Glucose, Bld: 97 mg/dL (ref 65–99)
Potassium: 4.5 mmol/L (ref 3.5–5.3)
Sodium: 141 mmol/L (ref 135–146)
Total Bilirubin: 0.5 mg/dL (ref 0.2–1.2)
Total Protein: 6.8 g/dL (ref 6.1–8.1)

## 2020-08-12 NOTE — Assessment & Plan Note (Signed)
Changed diagnosis on her problem list today.  History of hypertension.  She has been without medication for almost a year at this point and has done really well so we will go to seeing her yearly.

## 2020-08-12 NOTE — Assessment & Plan Note (Signed)
[place] for repeat DEXA today.  Last DEXA in 2019 showing a T score of -2.2.  Currently on Fosamax for the last 2 years discussed that typically we will continue it for total of 5 and then take a "drug holiday" and monitor DEXA during that time.

## 2020-08-12 NOTE — Assessment & Plan Note (Signed)
Tolerating statin well.  Follow lipids and liver enzymes yearly.  Due for updated blood work.

## 2020-08-12 NOTE — Progress Notes (Signed)
Established Patient Office Visit  Subjective:  Patient ID: Tanya Cortez, female    DOB: 1951-11-14  Age: 69 y.o. MRN: [id number]  CC:  Chief Complaint  Patient presents with  . Hypothyroidism  . Hypertension    HPI Tanya Cortez presents for   Hypertension- Pt denies chest pain, SOB, dizziness, or heart palpitations.  Denies medication side effects.    She did completed PT for her left hip in June.  She thinks she may have formed some scar tissue so had a little bit more difficult recovery with that hip replacement but is doing much better now it has not kept her from her activities.  Osteopenia-wanted to know when she would be able to come off the Fosamax.  Past Medical History:  Diagnosis Date  . Degenerative joint disease (DJD) of hip 06/13/2018  . Essential hypertension, benign 04/22/2014  . History of hip replacement    right  . Hyperlipidemia 04/22/2014  . Osteopenia determined by x-ray 04/20/2018   T score -2.1 on Dexa scan 2017    Past Surgical History:  Procedure Laterality Date  . TONSILLECTOMY    . TOTAL HIP ARTHROPLASTY Left 12/15/2018   Procedure: LEFT TOTAL HIP ARTHROPLASTY ANTERIOR APPROACH;  Surgeon: Kathryne Hitch, MD;  Location: [facility];  Service: Orthopedics;  Laterality: Left;  . TOTAL HIP ARTHROPLASTY Right 09/21/2019   Procedure: RIGHT TOTAL HIP ARTHROPLASTY ANTERIOR APPROACH;  Surgeon: Kathryne Hitch, MD;  Location: [facility];  Service: Orthopedics;  Laterality: Right;    Family History  Problem Relation Age of Onset  . Skin cancer Mother        squamous cell   . Hypertension Mother   . Breast cancer Mother   . COPD Mother   . Heart attack Maternal Grandfather   . Stroke Maternal Grandfather   . Heart attack Father        died 31 yo  . Hypertension Sister     Social History   Socioeconomic History  . Marital status: Single    Spouse name: Not on file  . Number of children: Not on file  . Years of education: 22  .  Highest education level: Bachelor's degree (e.g., BA, AB, BS)  Occupational History  . Occupation: high school principal    Comment: [employer]  Tobacco Use  . Smoking status: Never Smoker  . Smokeless tobacco: Never Used  Vaping Use  . Vaping Use: Never used  Substance and Sexual Activity  . Alcohol use: No  . Drug use: No  . Sexual activity: Never  Other Topics Concern  . Not on file  Social History Narrative   Patient reports exercising regularly by doing 30 -45 mins daily of cardio. 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  She has a degree in education specialty.  Lives with [name].  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 Difficulty of Paying Living Expenses:   Food Insecurity:   . Worried About Programme researcher, broadcasting/film/video in the Last Year:   . Barista in the Last Year:   Transportation Needs:   . Freight forwarder (Medical):   Marland Kitchen Lack of Transportation (Non-Medical):   Physical Activity:   . Days of Exercise per Week:   . Minutes of Exercise per Session:   Stress:   . Feeling of Stress :   Social Connections:   .  Frequency of Communication with Friends and Family:   . Frequency of Social Gatherings with Friends and Family:   . Attends Religious Services:   . Active Member of Clubs or Organizations:   . Attends Banker Meetings:   Marland Kitchen Marital Status:   Intimate Partner Violence:   . Fear of Current or Ex-Partner:   . Emotionally Abused:   Marland Kitchen Physically Abused:   . Sexually Abused:     Outpatient Medications Prior to Visit  Medication Sig Dispense Refill  . alendronate (FOSAMAX) 70 MG tablet Take 1 tablet (70 mg total) by mouth once a week. Fridays 12 tablet 3  . atorvastatin (LIPITOR) 40 MG tablet Take 1 tablet (40 mg total) by mouth daily. 90 tablet 3  . Calcium Carbonate-Vit D-Min (CALTRATE 600+D PLUS MINERALS) 600-800 MG-UNIT TABS Take 1 tablet by mouth  2 (two) times daily.     No facility-administered medications prior to visit.    No Known Allergies  ROS Review of Systems    Objective:    Physical Exam Constitutional:      Appearance: She is well-developed.  HENT:     Head: Normocephalic and atraumatic.  Neck:     Comments: No carotid bruits Cardiovascular:     Rate and Rhythm: Normal rate and regular rhythm.     Heart sounds: Normal heart sounds.  Pulmonary:     Effort: Pulmonary effort is normal.     Breath sounds: Normal breath sounds.  Skin:    General: Skin is warm and dry.  Neurological:     Mental Status: She is alert and oriented to person, place, and time.  Psychiatric:        Behavior: Behavior normal.     BP (!) 122/59   Pulse 84   Ht 5\' 4"  (1.626 m)   Wt 139 lb (63 kg)   SpO2 100%   BMI 23.86 kg/m  Wt Readings from Last 3 Encounters:  08/12/20 139 lb (63 kg)  02/13/20 137 lb (62.1 kg)  02/11/20 138 lb (62.6 kg)     Health Maintenance Due  Topic Date Due  . INFLUENZA VACCINE  07/27/2020    There are no preventive care reminders to display for this patient.  Lab Results  Component Value Date   TSH 2.300 04/25/2014   Lab Results  Component Value Date   WBC 9.2 09/22/2019   HGB 11.2 (L) 09/22/2019   HCT 34.6 (L) 09/22/2019   MCV 96.1 09/22/2019   PLT 162 09/22/2019   Lab Results  Component Value Date   NA 134 (L) 09/22/2019   K 3.5 09/22/2019   CO2 24 09/22/2019   GLUCOSE 118 (H) 09/22/2019   BUN 10 09/22/2019   CREATININE 0.62 09/22/2019   BILITOT 0.5 08/08/2019   ALKPHOS 93 08/02/2017   AST 19 08/08/2019   ALT 9 08/08/2019   PROT 6.9 08/08/2019   ALBUMIN 4.5 08/02/2017   CALCIUM 8.0 (L) 09/22/2019   ANIONGAP 8 09/22/2019   Lab Results  Component Value Date   CHOL 187 08/08/2019   Lab Results  Component Value Date   HDL 74 08/08/2019   Lab Results  Component Value Date   LDLCALC 99 08/08/2019   Lab Results  Component Value Date   TRIG 54 08/08/2019   Lab  Results  Component Value Date   CHOLHDL 2.5 08/08/2019   Lab Results  Component Value Date   HGBA1C 5.5 05/08/2014      Assessment & Plan:  Problem List Items Addressed This Visit      Other   Osteopenia determined by x-ray    Duanne Guess for repeat DEXA today.  Last DEXA in 2019 showing a T score of -2.2.  Currently on Fosamax for the last 2 years discussed that typically we will continue it for total of 5 and then take a "drug holiday" and monitor DEXA during that time.      Relevant Orders   DG Bone Density   Hyperlipidemia    Tolerating statin well.  Follow lipids and liver enzymes yearly.  Due for updated blood work.      Relevant Orders   Lipid panel   COMPLETE METABOLIC PANEL WITH GFR   History of hypertension - Primary    Changed diagnosis on her problem list today.  History of hypertension.  She has been without medication for almost a year at this point and has done really well so we will go to seeing her yearly.       Other Visit Diagnoses    Low hemoglobin       Relevant Orders   CBC      No orders of the defined types were placed in this encounter.   Follow-up: Return in about 1 year (around 08/12/2021) for cholesterol and ostreopororsis.     Nani Gasser, MD

## 2020-09-23 ENCOUNTER — Other Ambulatory Visit: Payer: Self-pay | Admitting: Family Medicine

## 2020-09-23 DIAGNOSIS — Z1231 Encounter for screening mammogram for malignant neoplasm of breast: Secondary | ICD-10-CM

## 2020-09-23 IMAGING — DX DG HIP (WITH OR WITHOUT PELVIS) 2-3V*L*
3 series · 3 of 3 positions shown · non-contrast
Comparison: No recent.

CLINICAL DATA: Left hip pain.  Increasing pain.  No injury.

EXAM:
DG HIP (WITH OR WITHOUT PELVIS) 2-3V LEFT

[pelvis ap]
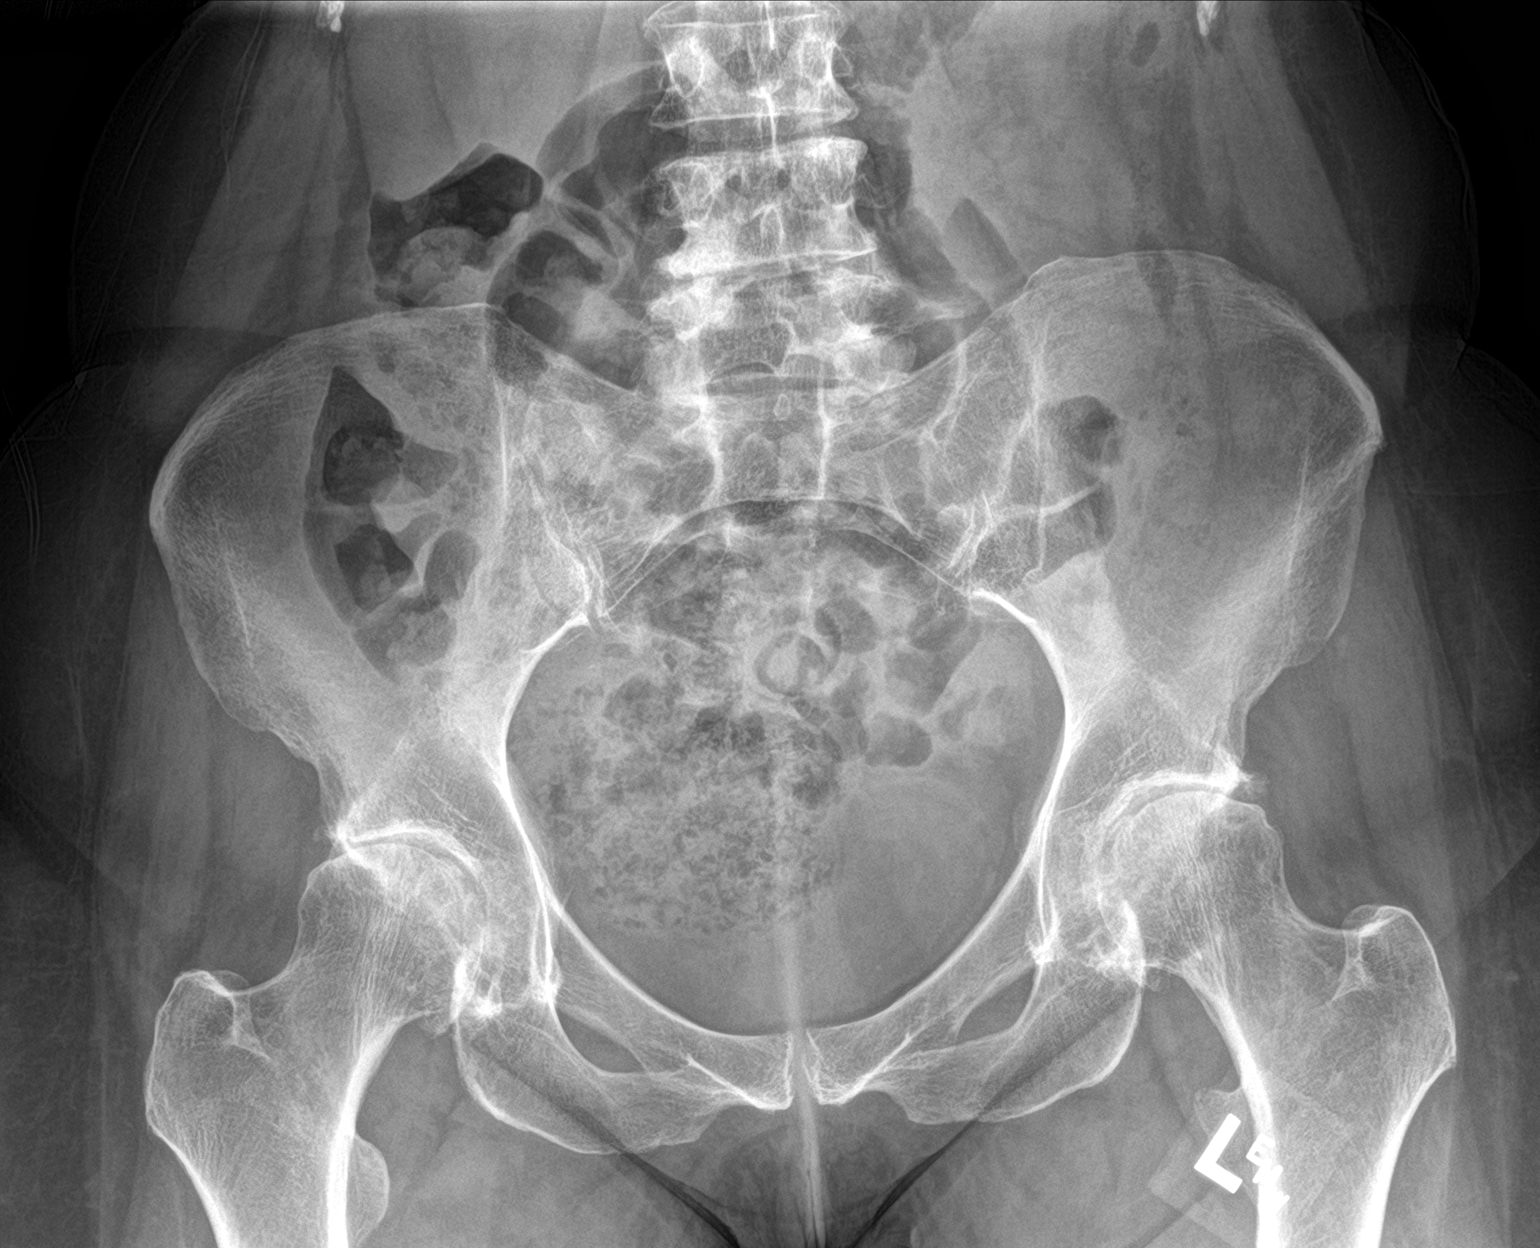

[hip ap]
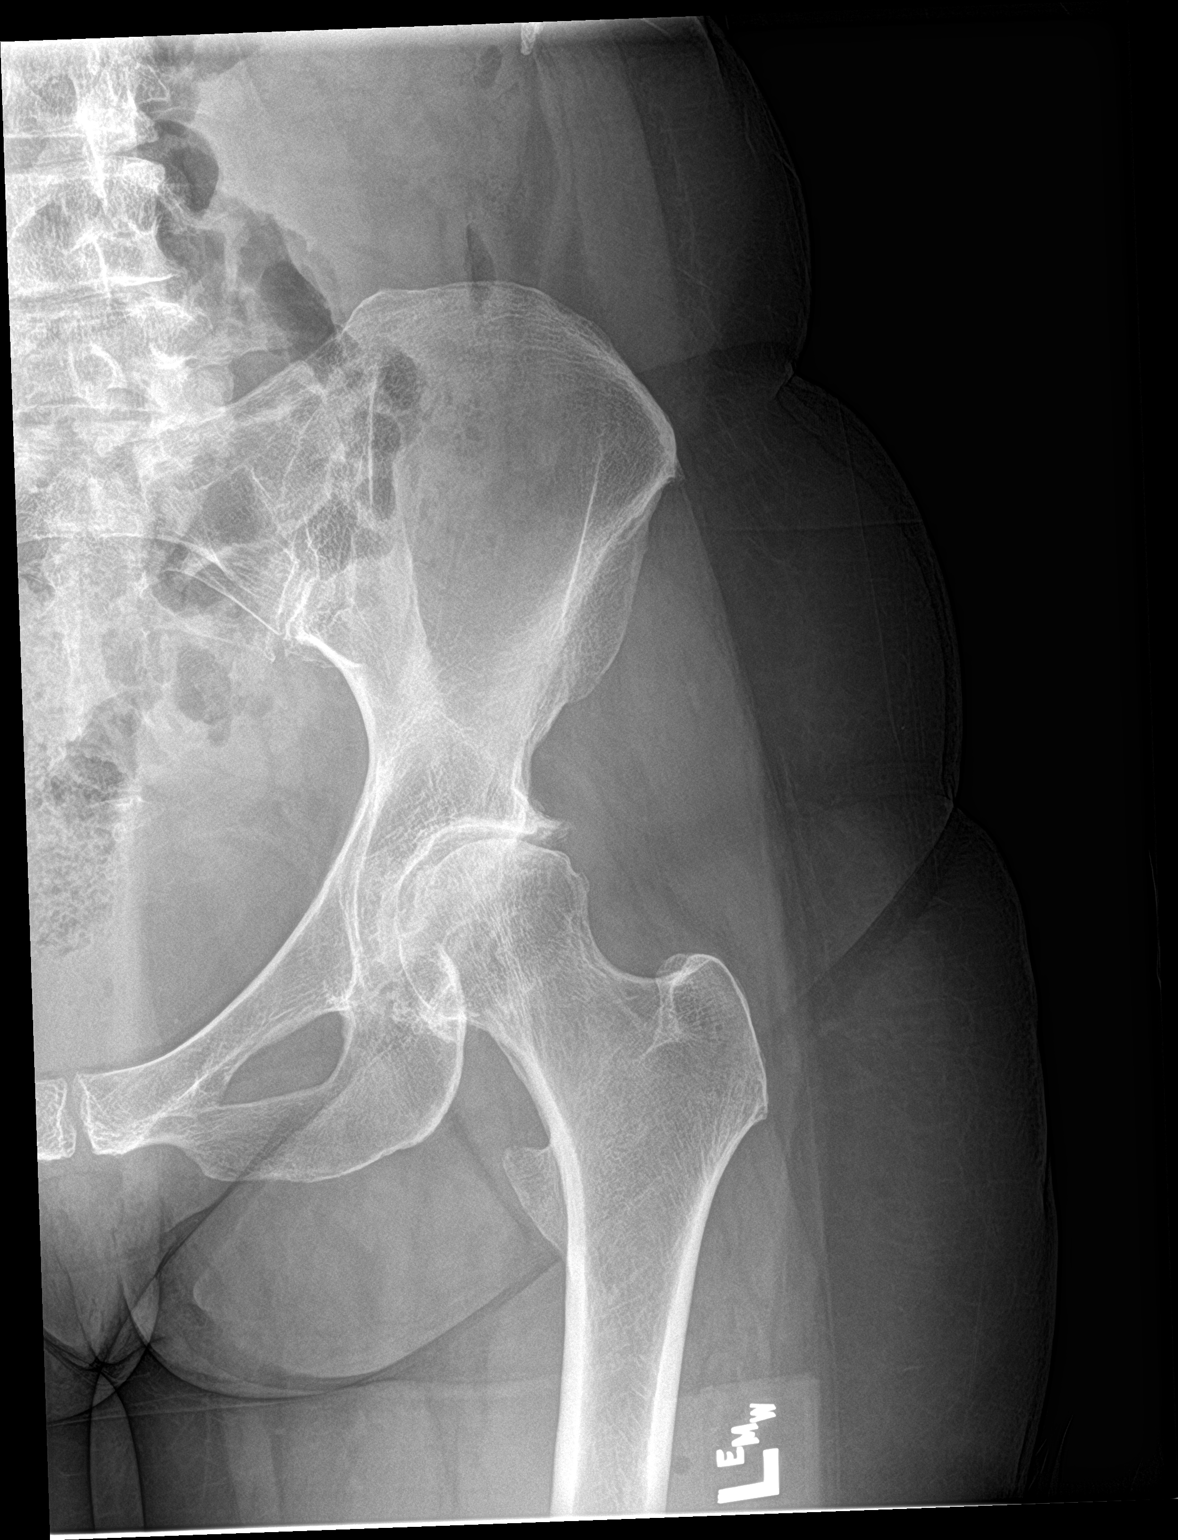

[hip lat]
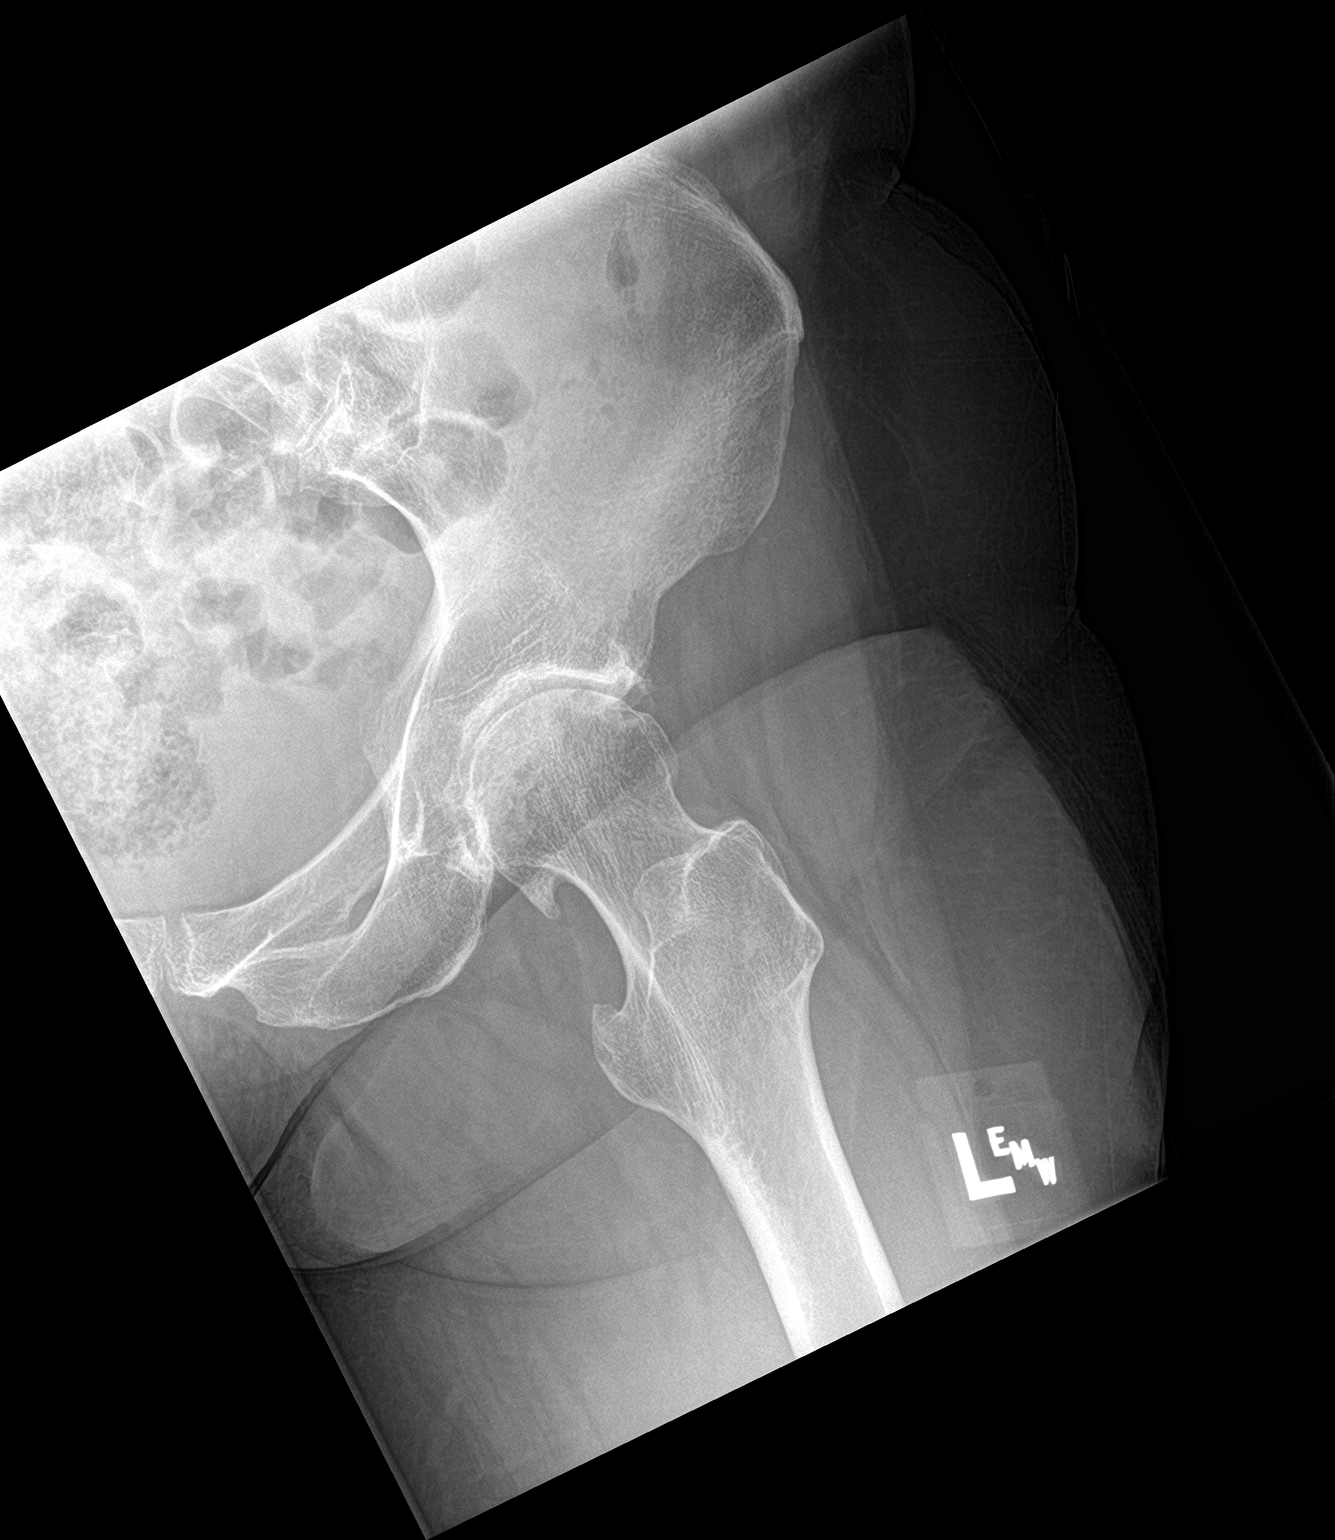

[3 of 3 positions shown; findings below may reference images not displayed]

FINDINGS: Degenerative changes lumbar spine and both hips. Diffuse osteopenia.
No acute bony abnormality identified. No evidence of fracture
dislocation.
IMPRESSION: Degenerative changes lumbar spine and both hips. Diffuse osteopenia.
No acute abnormality identified.

## 2020-10-12 ENCOUNTER — Encounter: Payer: Self-pay | Admitting: Family Medicine

## 2020-11-05 ENCOUNTER — Ambulatory Visit (INDEPENDENT_AMBULATORY_CARE_PROVIDER_SITE_OTHER): Payer: Medicare PPO

## 2020-11-05 ENCOUNTER — Other Ambulatory Visit: Payer: Self-pay

## 2020-11-05 DIAGNOSIS — M8580 Other specified disorders of bone density and structure, unspecified site: Secondary | ICD-10-CM

## 2020-11-05 DIAGNOSIS — Z780 Asymptomatic menopausal state: Secondary | ICD-10-CM | POA: Diagnosis not present

## 2020-11-05 DIAGNOSIS — Z1231 Encounter for screening mammogram for malignant neoplasm of breast: Secondary | ICD-10-CM | POA: Diagnosis not present

## 2020-11-05 DIAGNOSIS — M810 Age-related osteoporosis without current pathological fracture: Secondary | ICD-10-CM | POA: Diagnosis not present

## 2020-11-05 DIAGNOSIS — Z13820 Encounter for screening for osteoporosis: Secondary | ICD-10-CM | POA: Diagnosis not present

## 2020-11-05 DIAGNOSIS — M8589 Other specified disorders of bone density and structure, multiple sites: Secondary | ICD-10-CM | POA: Diagnosis not present

## 2020-11-10 ENCOUNTER — Encounter (HOSPITAL_BASED_OUTPATIENT_CLINIC_OR_DEPARTMENT_OTHER): Payer: Self-pay

## 2021-01-17 ENCOUNTER — Other Ambulatory Visit: Payer: Self-pay | Admitting: Family Medicine

## 2021-02-11 ENCOUNTER — Ambulatory Visit: Payer: Medicare PPO

## 2021-02-20 ENCOUNTER — Ambulatory Visit (INDEPENDENT_AMBULATORY_CARE_PROVIDER_SITE_OTHER): Payer: Medicare PPO | Admitting: Family Medicine

## 2021-02-20 DIAGNOSIS — Z0000 Encounter for general adult medical examination without abnormal findings: Secondary | ICD-10-CM

## 2021-02-20 NOTE — Patient Instructions (Addendum)
Health Maintenance, Female Adopting a healthy lifestyle and getting preventive care are important in promoting health and wellness. Ask your health care provider about:  The right schedule for you to have regular tests and exams.  Things you can do on your own to prevent diseases and keep yourself healthy. What should I know about diet, weight, and exercise? Eat a healthy diet  Eat a diet that includes plenty of vegetables, fruits, low-fat dairy products, and lean protein.  Do not eat a lot of foods that are high in solid fats, added sugars, or sodium.   Maintain a healthy weight Body mass index (BMI) is used to identify weight problems. It estimates body fat based on height and weight. Your health care provider can help determine your BMI and help you achieve or maintain a healthy weight. Get regular exercise Get regular exercise. This is one of the most important things you can do for your health. Most adults should:  Exercise for at least 150 minutes each week. The exercise should increase your heart rate and make you sweat (moderate-intensity exercise).  Do strengthening exercises at least twice a week. This is in addition to the moderate-intensity exercise.  Spend less time sitting. Even light physical activity can be beneficial. Watch cholesterol and blood lipids Have your blood tested for lipids and cholesterol at 71 years of age, then have this test every 5 years. Have your cholesterol levels checked more often if:  Your lipid or cholesterol levels are high.  You are older than 71 years of age.  You are at high risk for heart disease. What should I know about cancer screening? Depending on your health history and family history, you may need to have cancer screening at various ages. This may include screening for:  Breast cancer.  Cervical cancer.  Colorectal cancer.  Skin cancer.  Lung cancer. What should I know about heart disease, diabetes, and high blood  pressure? Blood pressure and heart disease  High blood pressure causes heart disease and increases the risk of stroke. This is more likely to develop in people who have high blood pressure readings, are of African descent, or are overweight.  Have your blood pressure checked: ? Every 3-5 years if you are 38-35 years of age. ? Every year if you are 19 years old or older. Diabetes Have regular diabetes screenings. This checks your fasting blood sugar level. Have the screening done:  Once every three years after age 64 if you are at a normal weight and have a low risk for diabetes.  More often and at a younger age if you are overweight or have a high risk for diabetes. What should I know about preventing infection? Hepatitis B If you have a higher risk for hepatitis B, you should be screened for this virus. Talk with your health care provider to find out if you are at risk for hepatitis B infection. Hepatitis C Testing is recommended for:  Everyone born from [birth year] through 1966.  Anyone with known risk factors for hepatitis C. Sexually transmitted infections (STIs)  Get screened for STIs, including gonorrhea and chlamydia, if: ? You are sexually active and are younger than 71 years of age. ? You are older than 71 years of age and your health care provider tells you that you are at risk for this type of infection. ? Your sexual activity has changed since you were last screened, and you are at increased risk for chlamydia or gonorrhea. Ask your health care  provider if you are at risk.  Ask your health care provider about whether you are at high risk for HIV. Your health care provider may recommend a prescription medicine to help prevent HIV infection. If you choose to take medicine to prevent HIV, you should first get tested for HIV. You should then be tested every 3 months for as long as you are taking the medicine. Pregnancy  If you are about to stop having your period (premenopausal) and  you may become pregnant, seek counseling before you get pregnant.  Take 400 to 800 micrograms (mcg) of folic acid every day if you become pregnant.  Ask for birth control (contraception) if you want to prevent pregnancy. Osteoporosis and menopause Osteoporosis is a disease in which the bones lose minerals and strength with aging. This can result in bone fractures. If you are 75 years old or older, or if you are at risk for osteoporosis and fractures, ask your health care provider if you should:  Be screened for bone loss.  Take a calcium or vitamin D supplement to lower your risk of fractures.  Be given hormone replacement therapy (HRT) to treat symptoms of menopause. Follow these instructions at home: Lifestyle  Do not use any products that contain nicotine or tobacco, such as cigarettes, e-cigarettes, and chewing tobacco. If you need help quitting, ask your health care provider.  Do not use street drugs.  Do not share needles.  Ask your health care provider for help if you need support or information about quitting drugs. Alcohol use  Do not drink alcohol if: ? Your health care provider tells you not to drink. ? You are pregnant, may be pregnant, or are planning to become pregnant.  If you drink alcohol: ? Limit how much you use to 0-1 drink a day. ? Limit intake if you are breastfeeding.  Be aware of how much alcohol is in your drink. In the [place] one drink equals one 12 oz bottle of beer (355 mL), one 5 oz glass of wine (148 mL), or one 1 oz glass of hard liquor (44 mL). General instructions  Schedule regular health, dental, and eye exams.  Stay current with your vaccines.  Tell your health care provider if: ? You often feel depressed. ? You have ever been abused or do not feel safe at home. Summary  Adopting a healthy lifestyle and getting preventive care are important in promoting health and wellness.  Follow your health care provider's instructions about healthy  diet, exercising, and getting tested or screened for diseases.  Follow your health care provider's instructions on monitoring your cholesterol and blood pressure. This information is not intended to replace advice given to you by your health care provider. Make sure you discuss any questions you have with your health care provider. Document Revised: 12/06/2018 Document Reviewed: 12/06/2018 Elsevier Patient Education  2021 Elsevier Inc.    MEDICARE [place] VISIT Health Maintenance Summary and Written Plan of Care  Ms. Ruffino ,  Thank you for allowing me to perform your Medicare Annual Wellness Visit and for your ongoing commitment to your health.   Health Maintenance & Immunization History Health Maintenance  Topic Date Due  . Fecal DNA (Cologuard)  08/26/2022  . MAMMOGRAM  11/05/2022  . TETANUS/TDAP  09/21/2026  . INFLUENZA VACCINE  Completed  . DEXA SCAN  Completed  . COVID-19 Vaccine  Completed  . Hepatitis C Screening  Completed  . PNA vac Low Risk Adult  Completed   Immunization  History  Administered Date(s) Administered  . Fluad Quad(high Dose 65+) 08/14/2019  . Influenza, High Dose Seasonal PF 09/10/2018  . Influenza,inj,Quad PF,6+ Mos 12/17/2014, 12/15/2015  . Influenza-Unspecified 09/21/2016, 12/10/2017, 10/12/2020  . PFIZER(Purple Top)SARS-COV-2 Vaccination 01/19/2020, 02/09/2020, 10/12/2020  . Pneumococcal Conjugate-13 06/12/2015  . Pneumococcal Polysaccharide-23 08/10/2016  . Tdap 05/08/2014, 09/21/2016  . Zoster 05/08/2014  . Zoster Recombinat (Shingrix) 01/06/2018    These are the patient goals that we discussed: Goals Addressed              This Visit's Progress   .  Patient Stated (pt-stated)        02/20/2021 AWV Goal: Exercise for General Health   Patient will verbalize understanding of the benefits of increased physical activity:  Exercising regularly is important. It will improve your overall fitness, flexibility, and  endurance.  Regular exercise also will improve your overall health. It can help you control your weight, reduce stress, and improve your bone density.  Over the next year, patient will increase physical activity as tolerated with a goal of at least 150 minutes of moderate physical activity per week.   You can tell that you are exercising at a moderate intensity if your heart starts beating faster and you start breathing faster but can still hold a conversation.  Moderate-intensity exercise ideas include:  Walking 1 mile (1.6 km) in about 15 minutes  Biking  Hiking  Golfing  Dancing  Water aerobics  Patient will verbalize understanding of everyday activities that increase physical activity by providing examples like the following: ? Yard work, such as: ? Pushing a Surveyor, mining ? Raking and bagging leaves ? Washing your car ? Pushing a stroller ? Shoveling snow ? Gardening ? Washing windows or floors  Patient will be able to explain general safety guidelines for exercising:   Before you start a new exercise program, talk with your health care provider.  Do not exercise so much that you hurt yourself, feel dizzy, or get very short of breath.  Wear comfortable clothes and wear shoes with good support.  Drink plenty of water while you exercise to prevent dehydration or heat stroke.  Work out until your breathing and your heartbeat get faster.         This is a list of Health Maintenance Items that are overdue or due now: There are no preventive care reminders to display for this patient.   Orders/Referrals Placed Today: No orders of the defined types were placed in this encounter.   Follow-up Plan Follow-up with Agapito Games, MD as planned

## 2021-02-20 NOTE — Progress Notes (Signed)
MEDICARE ANNUAL WELLNESS VISIT  02/20/2021  Telephone Visit Disclaimer This Medicare AWV was conducted by telephone due to national recommendations for restrictions regarding the COVID-19 Pandemic (e.g. social distancing).  I verified, using two identifiers, that I am speaking with Tanya Cortez or their authorized healthcare agent. I discussed the limitations, risks, security, and privacy concerns of performing an evaluation and management service by telephone and the potential availability of an in-person appointment in the future. The patient expressed understanding and agreed to proceed.  Location of Patient: Home Location of Provider (nurse):  In the office.  Subjective:    Tanya Cortez is a 70 y.o. female patient of Metheney, Tanya Ehlers, MD who had a Medicare Annual Wellness Visit today via telephone. [name] is Retired and lives with an adult companion. she has 0 children. she reports that she is socially active and does interact with friends/family regularly. she is moderately physically active and enjoys playing golf and walking her dogs.  Patient Care Team: Agapito Games, MD as PCP - General (Family Medicine)  Advanced Directives 02/20/2021 02/11/2020 09/21/2019 09/18/2019 12/15/2018 12/08/2018 04/18/2018  Does Patient Have a Medical Advance Directive? Yes Yes Yes Yes No No No  Type of Estate agent of [place];Living will Healthcare Power of [place];Living will Healthcare Power of [place];Living will Healthcare Power of [place];Living will - - -  Does patient want to make changes to medical advance directive? No - Patient declined No - Patient declined No - Patient declined No - Patient declined - - -  Copy of Healthcare Power of Attorney in Chart? - No - copy requested No - copy requested No - copy requested - - -  Would patient like information on creating a medical advance directive? - - - - No - Patient declined No - Patient declined Yes  (MAU/Ambulatory/Procedural Areas - Information given)    Hospital Utilization Over the Past 12 Months: # of hospitalizations or ER visits: 0 # of surgeries: 0  Review of Systems    Patient reports that her overall health is better compared to last year.  History obtained from chart review and the patient  Patient Reported Readings (BP, Pulse, CBG, Weight, etc) none  Pain Assessment Pain : No/denies pain     Current Medications & Allergies (verified) Allergies as of 02/20/2021   No Known Allergies     Medication List       Accurate as of February 20, 2021  8:37 AM. If you have any questions, ask your nurse or doctor.        alendronate 70 MG tablet Commonly known as: FOSAMAX TAKE 1 TABLET BY MOUTH ONCE A WEEK ON FRIDAY   AMINO ACIDS COMPLEX PO Take by mouth.   atorvastatin 40 MG tablet Commonly known as: LIPITOR Take 1 tablet (40 mg total) by mouth daily.   Caltrate 600+D Plus Minerals 600-800 MG-UNIT Tabs Take 1 tablet by mouth 2 (two) times daily.   Omega-3 1000 MG Caps Take by mouth.       History (reviewed): Past Medical History:  Diagnosis Date  . Degenerative joint disease (DJD) of hip 06/13/2018  . Essential hypertension, benign 04/22/2014  . History of hip replacement    right  . Hyperlipidemia 04/22/2014  . Osteopenia determined by x-ray 04/20/2018   T score -2.1 on Dexa scan 2017   Past Surgical History:  Procedure Laterality Date  . TONSILLECTOMY    . TOTAL HIP ARTHROPLASTY Left 12/15/2018   Procedure: LEFT TOTAL  HIP ARTHROPLASTY ANTERIOR APPROACH;  Surgeon: Kathryne Hitch, MD;  Location: [facility];  Service: Orthopedics;  Laterality: Left;  . TOTAL HIP ARTHROPLASTY Right 09/21/2019   Procedure: RIGHT TOTAL HIP ARTHROPLASTY ANTERIOR APPROACH;  Surgeon: Kathryne Hitch, MD;  Location: [facility];  Service: Orthopedics;  Laterality: Right;   Family History  Problem Relation Age of Onset  . Skin cancer Mother        squamous cell    . Hypertension Mother   . Breast cancer Mother   . COPD Mother   . Heart attack Maternal Grandfather   . Stroke Maternal Grandfather   . Heart attack Father        died 51 yo  . Hypertension Sister    Social History   Socioeconomic History  . Marital status: Single    Spouse name: Not on file  . Number of children: 0  . Years of education: 16  . Highest education level: Bachelor's degree (e.g., BA, AB, BS)  Occupational History  . Occupation: high school principal    Comment: [employer]  . Occupation: Retired  Tobacco Use  . Smoking status: Never Smoker  . Smokeless tobacco: Never Used  Vaping Use  . Vaping Use: Never used  Substance and Sexual Activity  . Alcohol use: No  . Drug use: No  . Sexual activity: Never  Other Topics Concern  . Not on file  Social History Narrative   Formerly HS principal [employer].  She has a degree in education specialty.  Lives with [name].  Plays golf 2-3 times a week and works out with a trainer 3 times a week for 30 mins.   Social Determinants of Health   Financial Resource Strain: Low Risk   . Difficulty of Paying Living Expenses: Not hard at all  Food Insecurity: No Food Insecurity  . Worried About Programme researcher, broadcasting/film/video in the Last Year: Never true  . Ran Out of Food in the Last Year: Never true  Transportation Needs: No Transportation Needs  . Lack of Transportation (Medical): No  . Lack of Transportation (Non-Medical): No  Physical Activity: Sufficiently Active  . Days of Exercise per Week: 7 days  . Minutes of Exercise per Session: 150+ min  Stress: No Stress Concern Present  . Feeling of Stress : Not at all  Social Connections: Moderately Integrated  . Frequency of Communication with Friends and Family: More than three times a week  . Frequency of Social Gatherings with Friends and Family: More than three times a week  . Attends Religious Services: More than 4 times per year  . Active Member of Clubs or Organizations: Yes   . Attends Banker Meetings: More than 4 times per year  . Marital Status: Never married    Activities of Daily Living In your present state of health, do you have any difficulty performing the following activities: 02/20/2021  Hearing? N  Vision? N  Difficulty concentrating or making decisions? N  Walking or climbing stairs? N  Dressing or bathing? N  Doing errands, shopping? N  Preparing Food and eating ? N  Using the Toilet? N  In the past six months, have you accidently leaked urine? N  Do you have problems with loss of bowel control? N  Managing your Medications? N  Managing your Finances? N  Housekeeping or managing your Housekeeping? N  Some recent data might be hidden    Patient Education/ Literacy How often do you need to have  someone help you when you read instructions, pamphlets, or other written materials from your doctor or pharmacy?: 1 - Never What is the last grade level you completed in school?: Master Degree  Exercise Current Exercise Habits: Home exercise routine;Structured exercise class, Type of exercise: walking, Time (Minutes): 60, Frequency (Times/Week): 7, Weekly Exercise (Minutes/Week): 420, Intensity: Moderate, Exercise limited by: None identified  Diet Patient reports consuming 3 meals a day and 0 snack(s) a day Patient reports that her primary diet is: Regular Patient reports that she does have regular access to food.   Depression Screen PHQ 2/9 Scores 02/20/2021 02/11/2020 08/08/2019 08/01/2018 02/01/2018 08/02/2017 06/23/2016  PHQ - 2 Score 0 0 0 0 0 0 0  PHQ- 9 Score 0 - - - - - -     Fall Risk Fall Risk  02/20/2021 02/11/2020 08/08/2019 08/01/2018 02/01/2018  Falls in the past year? 0 0 0 No No  Number falls in past yr: 0 - 0 - -  Injury with Fall? 0 - 0 - -  Risk for fall due to : No Fall Risks No Fall Risks - - -  Follow up Falls evaluation completed Falls prevention discussed - - -     Objective:  Tanya Cortez seemed alert and  oriented and she participated appropriately during our telephone visit.  Blood Pressure Weight BMI  BP Readings from Last 3 Encounters:  08/12/20 (!) 122/59  02/13/20 120/71  02/11/20 137/78   Wt Readings from Last 3 Encounters:  08/12/20 139 lb (63 kg)  02/13/20 137 lb (62.1 kg)  02/11/20 138 lb (62.6 kg)   BMI Readings from Last 1 Encounters:  08/12/20 23.86 kg/m    *Unable to obtain current vital signs, weight, and BMI due to telephone visit type  Hearing/Vision  . Tiny did not seem to have difficulty with hearing/understanding during the telephone conversation . Reports that she has had a formal eye exam by an eye care professional within the past year . Reports that she has not had a formal hearing evaluation within the past year *Unable to fully assess hearing and vision during telephone visit type  Cognitive Function: 6CIT Screen 02/20/2021 02/11/2020  What Year? 0 points 4 points  What month? 0 points 0 points  What time? 0 points 0 points  Count back from 20 0 points 0 points  Months in reverse 2 points 0 points  Repeat phrase 0 points 0 points  Total Score 2 4   (Normal:0-7, Significant for Dysfunction: >8)  Normal Cognitive Function Screening: Yes   Immunization & Health Maintenance Record Immunization History  Administered Date(s) Administered  . Fluad Quad(high Dose 65+) 08/14/2019  . Influenza, High Dose Seasonal PF 09/10/2018  . Influenza,inj,Quad PF,6+ Mos 12/17/2014, 12/15/2015  . Influenza-Unspecified 09/21/2016, 12/10/2017, 10/12/2020  . PFIZER(Purple Top)SARS-COV-2 Vaccination 01/19/2020, 02/09/2020, 10/12/2020  . Pneumococcal Conjugate-13 06/12/2015  . Pneumococcal Polysaccharide-23 08/10/2016  . Tdap 05/08/2014, 09/21/2016  . Zoster 05/08/2014  . Zoster Recombinat (Shingrix) 01/06/2018    Health Maintenance  Topic Date Due  . Fecal DNA (Cologuard)  08/26/2022  . MAMMOGRAM  11/05/2022  . TETANUS/TDAP  09/21/2026  . INFLUENZA VACCINE   Completed  . DEXA SCAN  Completed  . COVID-19 Vaccine  Completed  . Hepatitis C Screening  Completed  . PNA vac Low Risk Adult  Completed       Assessment  This is a routine wellness examination for Tanya Cortez.  Health Maintenance: Due or Overdue There are no preventive care  reminders to display for this patient.  Tanya PrudePatricia A Gressman does not need a referral for Community Assistance: Care Management:   no Social Work:    no Prescription Assistance:  no Nutrition/Diabetes Education:  no   Plan:  Personalized Goals Goals Addressed              This Visit's Progress   .  Patient Stated (pt-stated)        02/20/2021 AWV Goal: Exercise for General Health   Patient will verbalize understanding of the benefits of increased physical activity:  Exercising regularly is important. It will improve your overall fitness, flexibility, and endurance.  Regular exercise also will improve your overall health. It can help you control your weight, reduce stress, and improve your bone density.  Over the next year, patient will increase physical activity as tolerated with a goal of at least 150 minutes of moderate physical activity per week.   You can tell that you are exercising at a moderate intensity if your heart starts beating faster and you start breathing faster but can still hold a conversation.  Moderate-intensity exercise ideas include:  Walking 1 mile (1.6 km) in about 15 minutes  Biking  Hiking  Golfing  Dancing  Water aerobics  Patient will verbalize understanding of everyday activities that increase physical activity by providing examples like the following: ? Yard work, such as: ? Pushing a Surveyor, mininglawn mower ? Raking and bagging leaves ? Washing your car ? Pushing a stroller ? Shoveling snow ? Gardening ? Washing windows or floors  Patient will be able to explain general safety guidelines for exercising:   Before you start a new exercise program, talk with  your health care provider.  Do not exercise so much that you hurt yourself, feel dizzy, or get very short of breath.  Wear comfortable clothes and wear shoes with good support.  Drink plenty of water while you exercise to prevent dehydration or heat stroke.  Work out until your breathing and your heartbeat get faster.       Personalized Health Maintenance & Screening Recommendations  All are up to date.  Lung Cancer Screening Recommended: no (Low Dose CT Chest recommended if Age 55-80 years, 30 pack-year currently smoking OR have quit w/in past 15 years) Hepatitis C Screening recommended: no HIV Screening recommended: no  Advanced Directives: Written information was not prepared per patient's request.  Referrals & Orders No orders of the defined types were placed in this encounter.   Follow-up Plan . Follow-up with Agapito GamesMetheney, Catherine D, MD as planned   I have personally reviewed and noted the following in the patient's chart:   . Medical and social history . Use of alcohol, tobacco or illicit drugs  . Current medications and supplements . Functional ability and status . Nutritional status . Physical activity . Advanced directives . List of other physicians . Hospitalizations, surgeries, and ER visits in previous 12 months . Vitals . Screenings to include cognitive, depression, and falls . Referrals and appointments  In addition, I have reviewed and discussed with Tanya PrudePatricia A Staller certain preventive protocols, quality metrics, and best practice recommendations. A written personalized care plan for preventive services as well as general preventive health recommendations is available and can be mailed to the patient at her request.      [place] CharonBableen  Kaur, RN  02/20/2021

## 2021-03-16 ENCOUNTER — Other Ambulatory Visit: Payer: Self-pay | Admitting: Family Medicine

## 2021-04-07 ENCOUNTER — Encounter: Payer: Self-pay | Admitting: Family Medicine

## 2021-04-07 NOTE — Telephone Encounter (Signed)
A second booster is recommended for those 50+ or with weakened immune systems 4 month after their initial booster. I would recommend that she have this, but if she wants to hear from Dr. Linford Arnold on this she can wait until she returns as it is not urgent to have this completed.

## 2021-08-11 ENCOUNTER — Encounter: Payer: Self-pay | Admitting: Family Medicine

## 2021-08-11 ENCOUNTER — Ambulatory Visit: Payer: Medicare PPO | Admitting: Family Medicine

## 2021-08-11 ENCOUNTER — Other Ambulatory Visit: Payer: Self-pay

## 2021-08-11 VITALS — BP 120/66 | HR 73 | Ht 64.0 in | Wt 140.0 lb

## 2021-08-11 DIAGNOSIS — E559 Vitamin D deficiency, unspecified: Secondary | ICD-10-CM | POA: Diagnosis not present

## 2021-08-11 DIAGNOSIS — M8580 Other specified disorders of bone density and structure, unspecified site: Secondary | ICD-10-CM

## 2021-08-11 DIAGNOSIS — E7800 Pure hypercholesterolemia, unspecified: Secondary | ICD-10-CM

## 2021-08-11 DIAGNOSIS — I10 Essential (primary) hypertension: Secondary | ICD-10-CM | POA: Diagnosis not present

## 2021-08-11 NOTE — Progress Notes (Signed)
Established Patient Office Visit  Subjective:  Patient ID: Tanya Cortez, female    DOB: 11-10-1951  Age: 70 y.o. MRN: [id number]  CC:  Chief Complaint  Patient presents with   Hypertension    HPI Tanya Cortez presents for   Hyperlipidemia - tolerating stating well with no myalgias or significant side effects.  She still very active she has been playing pickle ball more recently and learn to play golf during COVID. Lab Results  Component Value Date   CHOL 217 (H) 08/12/2020   HDL 89 08/12/2020   LDLCALC 114 (H) 08/12/2020   TRIG 54 08/12/2020   CHOLHDL 2.4 08/12/2020   No concerns today.    F/U osteopenia.  She is currently on calcium with vitamin D in addition to Fosamax tolerating medication well without any side effects or problems.   Past Medical History:  Diagnosis Date   Degenerative joint disease (DJD) of hip 06/13/2018   Essential hypertension, benign 04/22/2014   History of hip replacement    right   Hyperlipidemia 04/22/2014   Osteopenia determined by x-ray 04/20/2018   T score -2.1 on Dexa scan 2017    Past Surgical History:  Procedure Laterality Date   TONSILLECTOMY     TOTAL HIP ARTHROPLASTY Left 12/15/2018   Procedure: LEFT TOTAL HIP ARTHROPLASTY ANTERIOR APPROACH;  Surgeon: Kathryne Hitch, MD;  Location: [facility];  Service: Orthopedics;  Laterality: Left;   TOTAL HIP ARTHROPLASTY Right 09/21/2019   Procedure: RIGHT TOTAL HIP ARTHROPLASTY ANTERIOR APPROACH;  Surgeon: Kathryne Hitch, MD;  Location: [facility];  Service: Orthopedics;  Laterality: Right;    Family History  Problem Relation Age of Onset   Skin cancer Mother        squamous cell    Hypertension Mother    Breast cancer Mother    COPD Mother    Heart attack Maternal Grandfather    Stroke Maternal Grandfather    Heart attack Father        died 80 yo   Hypertension Sister     Social History   Socioeconomic History   Marital status: Single    Spouse name: Not on  file   Number of children: 0   Years of education: 16   Highest education level: Bachelor's degree (e.g., BA, AB, BS)  Occupational History   Occupation: high school principal    Comment: [employer]   Occupation: Retired  Tobacco Use   Smoking status: Never   Smokeless tobacco: Never  Vaping Use   Vaping Use: Never used  Substance and Sexual Activity   Alcohol use: No   Drug use: No   Sexual activity: Never  Other Topics Concern   Not on file  Social History Narrative   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  She has a degree in education specialty.  Lives with [name].  Plays golf 2-3 times a week and works out with a trainer 3 times a week for 30 mins.   Social Determinants of Health   Financial Resource Strain: Low Risk    Difficulty of Paying Living Expenses: Not hard at all  Food Insecurity: No Food Insecurity   Worried About Programme researcher, broadcasting/film/video in the Last Year: Never true   Ran Out of Food in the Last Year: Never true  Transportation Needs: No Transportation Needs   Lack of Transportation (Medical): No   Lack of Transportation (Non-Medical): No  Physical Activity: Sufficiently Active   Days of Exercise per Week: 7  days   Minutes of Exercise per Session: 150+ min  Stress: No Stress Concern Present   Feeling of Stress : Not at all  Social Connections: Moderately Integrated   Frequency of Communication with Friends and Family: More than three times a week   Frequency of Social Gatherings with Friends and Family: More than three times a week   Attends Religious Services: More than 4 times per year   Active Member of [employer] or Organizations: Yes   Attends Engineer, structural: More than 4 times per year   Marital Status: Never married  Catering manager Violence: Not At Risk   Fear of Current or Ex-Partner: No   Emotionally Abused: No   Physically Abused: No   Sexually Abused: No    Outpatient Medications Prior to Visit  Medication Sig Dispense Refill    alendronate (FOSAMAX) 70 MG tablet TAKE 1 TABLET BY MOUTH ONCE A WEEK ON FRIDAY 12 tablet 3   atorvastatin (LIPITOR) 40 MG tablet Take 1 tablet by mouth once daily 90 tablet 3   Calcium Carbonate-Vit D-Min (CALTRATE 600+D PLUS MINERALS) 600-800 MG-UNIT TABS Take 1 tablet by mouth 2 (two) times daily.     Omega-3 1000 MG CAPS Take by mouth.     AMINO ACIDS COMPLEX PO Take by mouth.     No facility-administered medications prior to visit.    No Known Allergies  ROS Review of Systems    Objective:    Physical Exam Constitutional:      Appearance: Normal appearance. She is well-developed.  HENT:     Head: Normocephalic and atraumatic.  Cardiovascular:     Rate and Rhythm: Normal rate and regular rhythm.     Heart sounds: Normal heart sounds.  Pulmonary:     Effort: Pulmonary effort is normal.     Breath sounds: Normal breath sounds.  Musculoskeletal:     Cervical back: Neck supple. No tenderness.  Skin:    General: Skin is warm and dry.  Neurological:     Mental Status: She is alert and oriented to person, place, and time.  Psychiatric:        Behavior: Behavior normal.    BP 120/66   Pulse 73   Ht 5\' 4"  (1.626 m)   Wt 140 lb (63.5 kg)   SpO2 100%   BMI 24.03 kg/m  Wt Readings from Last 3 Encounters:  08/11/21 140 lb (63.5 kg)  08/12/20 139 lb (63 kg)  02/13/20 137 lb (62.1 kg)     Health Maintenance Due  Topic Date Due   INFLUENZA VACCINE  07/27/2021    There are no preventive care reminders to display for this patient.  Lab Results  Component Value Date   TSH 2.300 04/25/2014   Lab Results  Component Value Date   WBC 5.7 08/12/2020   HGB 14.3 08/12/2020   HCT 43.1 08/12/2020   MCV 93.1 08/12/2020   PLT 247 08/12/2020   Lab Results  Component Value Date   NA 141 08/12/2020   K 4.5 08/12/2020   CO2 32 08/12/2020   GLUCOSE 97 08/12/2020   BUN 23 08/12/2020   CREATININE 0.85 08/12/2020   BILITOT 0.5 08/12/2020   ALKPHOS 93 08/02/2017   AST 20  08/12/2020   ALT 13 08/12/2020   PROT 6.8 08/12/2020   ALBUMIN 4.5 08/02/2017   CALCIUM 10.2 08/12/2020   ANIONGAP 8 09/22/2019   Lab Results  Component Value Date   CHOL 217 (H) 08/12/2020  Lab Results  Component Value Date   HDL 89 08/12/2020   Lab Results  Component Value Date   LDLCALC 114 (H) 08/12/2020   Lab Results  Component Value Date   TRIG 54 08/12/2020   Lab Results  Component Value Date   CHOLHDL 2.4 08/12/2020   Lab Results  Component Value Date   HGBA1C 5.5 05/08/2014      Assessment & Plan:   Problem List Items Addressed This Visit       Other   Osteopenia determined by x-ray    Continue Fosamax as well as calcium and vitamin D as well continue weightbearing exercise.  We will check vitamin D level today.  Follow-up in 1 year.      Relevant Orders   VITAMIN D 25 Hydroxy (Vit-D Deficiency, Fractures)   Hyperlipidemia - Primary    Due to recheck lipids.  Goal for LDL less than 100.  Continue atorvastatin.      Relevant Orders   Lipid panel   COMPLETE METABOLIC PANEL WITH GFR   CBC   Other Visit Diagnoses     Essential hypertension, benign       Relevant Orders   Lipid panel   COMPLETE METABOLIC PANEL WITH GFR   CBC       No orders of the defined types were placed in this encounter.   Follow-up: Return in about 1 year (around 08/11/2022) for labs and cholesterol.    Nani Gasser, MD

## 2021-08-11 NOTE — Progress Notes (Signed)
Pt is fasting today.  

## 2021-08-11 NOTE — Assessment & Plan Note (Signed)
Continue Fosamax as well as calcium and vitamin D as well continue weightbearing exercise.  We will check vitamin D level today.  Follow-up in 1 year.

## 2021-08-11 NOTE — Assessment & Plan Note (Signed)
Due to recheck lipids.  Goal for LDL less than 100.  Continue atorvastatin.

## 2021-08-12 LAB — CBC
HCT: 42.7 % (ref 35.0–45.0)
Hemoglobin: 14 g/dL (ref 11.7–15.5)
MCH: 30.9 pg (ref 27.0–33.0)
MCHC: 32.8 g/dL (ref 32.0–36.0)
MCV: 94.3 fL (ref 80.0–100.0)
MPV: 11.3 fL (ref 7.5–12.5)
Platelets: 245 10*3/uL (ref 140–400)
RBC: 4.53 10*6/uL (ref 3.80–5.10)
RDW: 11.8 % (ref 11.0–15.0)
WBC: 5.8 10*3/uL (ref 3.8–10.8)

## 2021-08-12 LAB — LIPID PANEL
Cholesterol: 198 mg/dL (ref <200)
HDL: 77 mg/dL (ref >=50)
LDL Cholesterol (Calc): 102 mg/dL (calc) — ABNORMAL HIGH
Non-HDL Cholesterol (Calc): 121 mg/dL (calc) (ref <130)
Total CHOL/HDL Ratio: 2.6 (calc) (ref <5.0)
Triglycerides: 93 mg/dL (ref <150)

## 2021-08-12 LAB — COMPLETE METABOLIC PANEL WITH GFR
AG Ratio: 2.3 (calc) (ref 1.0–2.5)
ALT: 9 U/L (ref 6–29)
AST: 16 U/L (ref 10–35)
Albumin: 4.6 g/dL (ref 3.6–5.1)
Alkaline phosphatase (APISO): 82 U/L (ref 37–153)
BUN: 14 mg/dL (ref 7–25)
CO2: 34 mmol/L — ABNORMAL HIGH (ref 20–32)
Calcium: 9.7 mg/dL (ref 8.6–10.4)
Chloride: 103 mmol/L (ref 98–110)
Creat: 0.72 mg/dL (ref 0.60–1.00)
Globulin: 2 g/dL (calc) (ref 1.9–3.7)
Glucose, Bld: 98 mg/dL (ref 65–99)
Potassium: 4.2 mmol/L (ref 3.5–5.3)
Sodium: 144 mmol/L (ref 135–146)
Total Bilirubin: 0.4 mg/dL (ref 0.2–1.2)
Total Protein: 6.6 g/dL (ref 6.1–8.1)
eGFR: 89 mL/min/{1.73_m2} (ref >=60)

## 2021-08-12 LAB — VITAMIN D 25 HYDROXY (VIT D DEFICIENCY, FRACTURES): Vit D, 25-Hydroxy: 53 ng/mL (ref 30–100)

## 2021-09-03 DIAGNOSIS — E785 Hyperlipidemia, unspecified: Secondary | ICD-10-CM | POA: Diagnosis not present

## 2021-09-03 DIAGNOSIS — Z7983 Long term (current) use of bisphosphonates: Secondary | ICD-10-CM | POA: Diagnosis not present

## 2021-09-03 DIAGNOSIS — Z8249 Family history of ischemic heart disease and other diseases of the circulatory system: Secondary | ICD-10-CM | POA: Diagnosis not present

## 2021-09-03 DIAGNOSIS — R030 Elevated blood-pressure reading, without diagnosis of hypertension: Secondary | ICD-10-CM | POA: Diagnosis not present

## 2021-09-03 DIAGNOSIS — Z803 Family history of malignant neoplasm of breast: Secondary | ICD-10-CM | POA: Diagnosis not present

## 2021-09-03 DIAGNOSIS — M810 Age-related osteoporosis without current pathological fracture: Secondary | ICD-10-CM | POA: Diagnosis not present

## 2021-09-03 DIAGNOSIS — Z96649 Presence of unspecified artificial hip joint: Secondary | ICD-10-CM | POA: Diagnosis not present

## 2021-09-25 ENCOUNTER — Encounter: Payer: Self-pay | Admitting: Family Medicine

## 2021-10-12 ENCOUNTER — Other Ambulatory Visit: Payer: Self-pay | Admitting: Family Medicine

## 2021-10-12 DIAGNOSIS — Z1231 Encounter for screening mammogram for malignant neoplasm of breast: Secondary | ICD-10-CM

## 2021-11-11 ENCOUNTER — Ambulatory Visit (INDEPENDENT_AMBULATORY_CARE_PROVIDER_SITE_OTHER): Payer: Medicare PPO

## 2021-11-11 ENCOUNTER — Other Ambulatory Visit: Payer: Self-pay

## 2021-11-11 DIAGNOSIS — Z1231 Encounter for screening mammogram for malignant neoplasm of breast: Secondary | ICD-10-CM

## 2021-11-11 NOTE — Progress Notes (Signed)
Hi Tanya Cortez, there was a questionable asymmetry in that left breast.  So the imaging department will be contacting you for to do some additional imaging.

## 2021-11-12 ENCOUNTER — Other Ambulatory Visit: Payer: Self-pay | Admitting: Family Medicine

## 2021-11-12 DIAGNOSIS — R928 Other abnormal and inconclusive findings on diagnostic imaging of breast: Secondary | ICD-10-CM

## 2021-12-08 ENCOUNTER — Ambulatory Visit
Admission: RE | Admit: 2021-12-08 | Discharge: 2021-12-08 | Disposition: A | Discharge status: Home / Self Care | Payer: Medicare PPO | Source: Ambulatory Visit | Attending: Family Medicine | Admitting: Family Medicine

## 2021-12-08 DIAGNOSIS — R928 Other abnormal and inconclusive findings on diagnostic imaging of breast: Secondary | ICD-10-CM

## 2021-12-08 DIAGNOSIS — N6489 Other specified disorders of breast: Secondary | ICD-10-CM | POA: Diagnosis not present

## 2021-12-08 DIAGNOSIS — R922 Inconclusive mammogram: Secondary | ICD-10-CM | POA: Diagnosis not present

## 2021-12-22 ENCOUNTER — Other Ambulatory Visit: Payer: Self-pay | Admitting: Family Medicine

## 2022-03-08 ENCOUNTER — Other Ambulatory Visit: Payer: Self-pay | Admitting: Family Medicine

## 2022-05-11 ENCOUNTER — Encounter: Payer: Self-pay | Admitting: Family Medicine

## 2022-05-11 ENCOUNTER — Other Ambulatory Visit: Payer: Self-pay | Admitting: *Deleted

## 2022-05-11 DIAGNOSIS — M25552 Pain in left hip: Secondary | ICD-10-CM

## 2022-05-11 DIAGNOSIS — M25562 Pain in left knee: Secondary | ICD-10-CM

## 2022-05-17 ENCOUNTER — Ambulatory Visit (INDEPENDENT_AMBULATORY_CARE_PROVIDER_SITE_OTHER): Payer: Medicare PPO | Admitting: Family Medicine

## 2022-05-17 DIAGNOSIS — Z0000 Encounter for general adult medical examination without abnormal findings: Secondary | ICD-10-CM | POA: Diagnosis not present

## 2022-05-17 NOTE — Progress Notes (Signed)
MEDICARE ANNUAL WELLNESS VISIT  05/17/2022  Telephone Visit Disclaimer This Medicare AWV was conducted by telephone due to national recommendations for restrictions regarding the COVID-19 Pandemic (e.g. social distancing).  I verified, using two identifiers, that I am speaking with Tanya Cortez or their authorized healthcare agent. I discussed the limitations, risks, security, and privacy concerns of performing an evaluation and management service by telephone and the potential availability of an in-person appointment in the future. The patient expressed understanding and agreed to proceed.  Location of Patient: Home Location of Provider (nurse):  Provider home office.  Subjective:    Tanya Cortez is a 71 y.o. female patient of Metheney, Barbarann Ehlers, MD who had a Medicare Annual Wellness Visit today via telephone. Tanya Cortez is Retired and lives with an adult companion. she has 0 children. she reports that she is socially active and does interact with friends/family regularly. she is moderately physically active and enjoys pickle ball and golf.  Patient Care Team: Agapito Games, MD as PCP - General (Family Medicine)     05/17/2022   10:12 AM 02/20/2021    8:16 AM 02/11/2020    8:58 AM 09/21/2019   10:22 AM 09/18/2019    9:34 AM 12/15/2018    9:56 AM 12/08/2018    9:02 AM  Advanced Directives  Does Patient Have a Medical Advance Directive? Yes Yes Yes Yes Yes No No  Type of Advance Directive Living will Healthcare Power of [place];Living will Healthcare Power of [place];Living will Healthcare Power of [place];Living will Healthcare Power of [place];Living will    Does patient want to make changes to medical advance directive? No - Patient declined No - Patient declined No - Patient declined No - Patient declined No - Patient declined    Copy of Healthcare Power of Attorney in Chart?   No - copy requested No - copy requested No - copy requested    Would patient like  information on creating a medical advance directive?      No - Patient declined No - Patient declined    Hospital Utilization Over the Past 12 Months: # of hospitalizations or ER visits: 0 # of surgeries: 0  Review of Systems    Patient reports that her overall health is unchanged compared to last year.  History obtained from chart review and the patient  Patient Reported Readings (BP, Pulse, CBG, Weight, etc) none  Pain Assessment Pain : No/denies pain     Current Medications & Allergies (verified) Allergies as of 05/17/2022   No Known Allergies      Medication List        Accurate as of May 17, 2022 10:24 AM. If you have any questions, ask your nurse or doctor.          alendronate 70 MG tablet Commonly known as: FOSAMAX TAKE 1 TABLET BY MOUTH EVERY FRIDAY   atorvastatin 40 MG tablet Commonly known as: LIPITOR Take 1 tablet by mouth once daily   Caltrate 600+D Plus Minerals 600-800 MG-UNIT Tabs Take 1 tablet by mouth 2 (two) times daily.   Omega-3 1000 MG Caps Take by mouth.        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   Procedure: LEFT TOTAL HIP ARTHROPLASTY ANTERIOR APPROACH;  Surgeon: Kathryne HitchBlackman, Christopher Y, MD;  Location: [facility];  Service: Orthopedics;  Laterality: Left;   TOTAL HIP ARTHROPLASTY Right 09/21/2019   Procedure: RIGHT TOTAL HIP ARTHROPLASTY ANTERIOR APPROACH;  Surgeon: Kathryne HitchBlackman, Christopher Y, MD;  Location: [facility];  Service: Orthopedics;  Laterality: Right;   Family History  Problem Relation Age of Onset   Skin  cancer Mother        squamous cell    Hypertension Mother    Breast cancer Mother    COPD Mother    Cancer Mother    Hearing loss Mother    Heart disease Mother    Heart attack Maternal Grandfather    Stroke Maternal Grandfather    Heart disease Maternal Grandfather    Heart attack Father        died 72 yo   Heart disease Father    Vision loss Father    Hypertension Sister    Cancer Sister    Heart disease Sister    Social History   Socioeconomic History   Marital status: Single    Spouse name: Not on file   Number of children: 0   Years of education: 16   Highest education level: Professional school degree (e.g., MD, DDS, DVM, JD)  Occupational History   Occupation: high school principal    Comment: [employer]   Occupation: Retired  Tobacco Use   Smoking status: Never   Smokeless tobacco: Never  Building services engineerVaping Use   Vaping Use: Never used  Substance and Sexual Activity   Alcohol use: No   Drug use: No   Sexual activity: Never  Other Topics Concern   Not on file  Social History Narrative   Formerly HS principal at [employer].  She has a degree in education specialty.  Lives with Tanya CoolerKathryn Cortez.  Plays golf 2-3 times a week and works out with a trainer 3 times a week for 30 mins.   Social Determinants of Health   Financial Resource Strain: Low Risk    Difficulty of Paying Living Expenses: Not hard at all  Food Insecurity: No Food Insecurity   Worried About Programme researcher, broadcasting/film/videounning Out of Food in the Last Year: Never true   Ran Out of Food in the Last Year: Never true  Transportation Needs: No Transportation Needs   Lack of Transportation (Medical): No   Lack of Transportation (Non-Medical): No  Physical Activity: Sufficiently Active   Days of Exercise per Week: 7 days   Minutes of Exercise per Session: 60 min  Stress: No Stress Concern Present   Feeling of Stress : Not at all  Social Connections: Moderately Integrated   Frequency of Communication with Friends and Family: More than three  times a week   Frequency of Social Gatherings with Friends and Family: More than three times a week   Attends Religious Services: More than 4 times per year   Active Member of [employer] or Organizations: Yes   Attends BankerClub or Organization Meetings: More than 4 times per year   Marital Status: Never married    Activities of Daily Living    05/12/2022   12:47 PM  In your present state of health, do you have any difficulty performing the following activities:  Hearing? 0  Vision? 0  Difficulty  concentrating or making decisions? 0  Walking or climbing stairs? 0  Dressing or bathing? 0  Doing errands, shopping? 0  Preparing Food and eating ? N  Using the Toilet? N  In the past six months, have you accidently leaked urine? N  Do you have problems with loss of bowel control? N  Managing your Medications? N  Managing your Finances? N  Housekeeping or managing your Housekeeping? N    Patient Education/ Literacy How often do you need to have someone help you when you read instructions, pamphlets, or other written materials from your doctor or pharmacy?: 1 - Never What is the last grade level you completed in school?: [employer] degree  Exercise Current Exercise Habits: Home exercise routine, Type of exercise: strength training/weights (golf, pickle ball and riding her bike), Time (Minutes): 60, Frequency (Times/Week): 6, Weekly Exercise (Minutes/Week): 360, Intensity: Moderate, Exercise limited by: None identified  Diet Patient reports consuming 3 meals a day and 0 snack(s) a day Patient reports that her primary diet is: Regular Patient reports that she does have regular access to food.   Depression Screen    05/17/2022   10:16 AM 08/11/2021    8:09 AM 02/20/2021    8:17 AM 02/11/2020    9:00 AM 08/08/2019    8:16 AM 08/01/2018    8:17 AM 02/01/2018    8:18 AM  PHQ 2/9 Scores  PHQ - 2 Score 0 0 0 0 0 0 0  PHQ- 9 Score   0         Fall Risk    05/12/2022   12:47 PM 08/11/2021    8:09 AM  02/20/2021    8:17 AM 02/11/2020    9:15 AM 08/08/2019    8:16 AM  Fall Risk   Falls in the past year? 0 0 0 0 0  Number falls in past yr: 0 0 0  0  Injury with Fall? 0 0 0  0  Risk for fall due to : No Fall Risks No Fall Risks No Fall Risks No Fall Risks   Follow up Falls evaluation completed Falls prevention discussed;Falls evaluation completed Falls evaluation completed Falls prevention discussed      Objective:  Tanya Cortez seemed alert and oriented and she participated appropriately during our telephone visit.  Blood Pressure Weight BMI  BP Readings from Last 3 Encounters:  08/11/21 120/66  08/12/20 (!) 122/59  02/13/20 120/71   Wt Readings from Last 3 Encounters:  08/11/21 140 lb (63.5 kg)  08/12/20 139 lb (63 kg)  02/13/20 137 lb (62.1 kg)   BMI Readings from Last 1 Encounters:  08/11/21 24.03 kg/m    *Unable to obtain current vital signs, weight, and BMI due to telephone visit type  Hearing/Vision  Tanya Cortez did not seem to have difficulty with hearing/understanding during the telephone conversation Reports that she has had a formal eye exam by an eye care professional within the past year Reports that she has not had a formal hearing evaluation within the past year *Unable to fully assess hearing and vision during telephone visit type  Cognitive Function:    05/17/2022   10:17 AM 02/20/2021    8:25 AM 02/11/2020    9:04 AM  6CIT Screen  What Year? 0 points 0 points 4 points  What month? 0 points 0 points 0 points  What time? 0 points 0 points 0 points  Count back from 20 0 points 0 points 0 points  Months in reverse 0  points 2 points 0 points  Repeat phrase 0 points 0 points 0 points  Total Score 0 points 2 points 4 points   (Normal:0-7, Significant for Dysfunction: >8)  Normal Cognitive Function Screening: Yes   Immunization & Health Maintenance Record Immunization History  Administered Date(s) Administered   Fluad Quad(high Dose 65+) 08/14/2019    Influenza, High Dose Seasonal PF 09/10/2018   Influenza,inj,Quad PF,6+ Mos 12/17/2014, 12/15/2015   Influenza-Unspecified 09/21/2016, 12/10/2017, 10/12/2020, 09/25/2021   PFIZER(Purple Top)SARS-COV-2 Vaccination 01/19/2020, 02/09/2020, 10/12/2020, 04/10/2021, 09/25/2021   Pneumococcal Conjugate-13 06/12/2015   Pneumococcal Polysaccharide-23 08/10/2016   Tdap 05/08/2014, 09/21/2016   Zoster Recombinat (Shingrix) 09/21/2017, 01/06/2018   Zoster, Live 05/08/2014    Health Maintenance  Topic Date Due   INFLUENZA VACCINE  07/27/2022   Fecal DNA (Cologuard)  08/26/2022   MAMMOGRAM  11/12/2023   TETANUS/TDAP  09/21/2026   Pneumonia Vaccine 62+ Years old  Completed   DEXA SCAN  Completed   COVID-19 Vaccine  Completed   Hepatitis C Screening  Completed   Zoster Vaccines- Shingrix  Completed   HPV VACCINES  Aged Out       Assessment  This is a routine wellness examination for Tanya Cortez.  Health Maintenance: Due or Overdue There are no preventive care reminders to display for this patient.  Tanya Cortez does not need a referral for Community Assistance: Care Management:   no Social Work:    no Prescription Assistance:  no Nutrition/Diabetes Education:  no   Plan:  Personalized Goals  Goals Addressed               This Visit's Progress     Patient Stated (pt-stated)        Still working with physical therapy related to her hip. Would like to be able to get through all of it.       Personalized Health Maintenance & Screening Recommendations  There are no preventive care reminders to display for this patient.  Lung Cancer Screening Recommended: no (Low Dose CT Chest recommended if Age 9-80 years, 30 pack-year currently smoking OR have quit w/in past 15 years) Hepatitis C Screening recommended: no HIV Screening recommended: no  Advanced Directives: Written information was not prepared per patient's request.  Referrals & Orders No orders of the defined  types were placed in this encounter.   Follow-up Plan Follow-up with Agapito Games, MD as planned Medicare wellness visit in one year. Patient will access AVS on my chart.   I have personally reviewed and noted the following in the patient's chart:   Medical and social history Use of alcohol, tobacco or illicit drugs  Current medications and supplements Functional ability and status Nutritional status Physical activity Advanced directives List of other physicians Hospitalizations, surgeries, and ER visits in previous 12 months Vitals Screenings to include cognitive, depression, and falls Referrals and appointments  In addition, I have reviewed and discussed with Tanya Cortez certain preventive protocols, quality metrics, and best practice recommendations. A written personalized care plan for preventive services as well as general preventive health recommendations is available and can be mailed to the patient at her request.      Tanya Charon, RN  05/17/2022

## 2022-05-17 NOTE — Patient Instructions (Addendum)
MEDICARE ANNUAL WELLNESS VISIT Health Maintenance Summary and Written Plan of Care  Tanya Cortez ,  Thank you for allowing me to perform your Medicare Annual Wellness Visit and for your ongoing commitment to your health.   Health Maintenance & Immunization History Health Maintenance  Topic Date Due  . INFLUENZA VACCINE  07/27/2022  . Fecal DNA (Cologuard)  08/26/2022  . MAMMOGRAM  11/12/2023  . TETANUS/TDAP  09/21/2026  . Pneumonia Vaccine 72+ Years old  Completed  . DEXA SCAN  Completed  . COVID-19 Vaccine  Completed  . Hepatitis C Screening  Completed  . Zoster Vaccines- Shingrix  Completed  . HPV VACCINES  Aged Out   Immunization History  Administered Date(s) Administered  . Fluad Quad(high Dose 65+) 08/14/2019  . Influenza, High Dose Seasonal PF 09/10/2018  . Influenza,inj,Quad PF,6+ Mos 12/17/2014, 12/15/2015  . Influenza-Unspecified 09/21/2016, 12/10/2017, 10/12/2020, 09/25/2021  . PFIZER(Purple Top)SARS-COV-2 Vaccination 01/19/2020, 02/09/2020, 10/12/2020, 04/10/2021, 09/25/2021  . Pneumococcal Conjugate-13 06/12/2015  . Pneumococcal Polysaccharide-23 08/10/2016  . Tdap 05/08/2014, 09/21/2016  . Zoster Recombinat (Shingrix) 09/21/2017, 01/06/2018  . Zoster, Live 05/08/2014    These are the patient goals that we discussed:  Goals Addressed              This Visit's Progress   .  Patient Stated (pt-stated)        Still working with physical therapy related to her hip. Would like to be able to get through all of it.        This is a list of Health Maintenance Items that are overdue or due now: There are no preventive care reminders to display for this patient.   Orders/Referrals Placed Today: No orders of the defined types were placed in this encounter.  (Contact our referral department at [phone] if you have not spoken with someone about your referral appointment within the next 5 days)    Follow-up Plan Follow-up with Agapito Games, MD as  planned Medicare wellness visit in one year. Patient will access AVS on my chart.      Health Maintenance, Female Adopting a healthy lifestyle and getting preventive care are important in promoting health and wellness. Ask your health care provider about: The right schedule for you to have regular tests and exams. Things you can do on your own to prevent diseases and keep yourself healthy. What should I know about diet, weight, and exercise? Eat a healthy diet  Eat a diet that includes plenty of vegetables, fruits, low-fat dairy products, and lean protein. Do not eat a lot of foods that are high in solid fats, added sugars, or sodium. Maintain a healthy weight Body mass index (BMI) is used to identify weight problems. It estimates body fat based on height and weight. Your health care provider can help determine your BMI and help you achieve or maintain a healthy weight. Get regular exercise Get regular exercise. This is one of the most important things you can do for your health. Most adults should: Exercise for at least 150 minutes each week. The exercise should increase your heart rate and make you sweat (moderate-intensity exercise). Do strengthening exercises at least twice a week. This is in addition to the moderate-intensity exercise. Spend less time sitting. Even light physical activity can be beneficial. Watch cholesterol and blood lipids Have your blood tested for lipids and cholesterol at 72 years of age, then have this test every 5 years. Have your cholesterol levels checked more often if: Your lipid or cholesterol  levels are high. You are older than 72 years of age. You are at high risk for heart disease. What should I know about cancer screening? Depending on your health history and family history, you may need to have cancer screening at various ages. This may include screening for: Breast cancer. Cervical cancer. Colorectal cancer. Skin cancer. Lung cancer. What  should I know about heart disease, diabetes, and high blood pressure? Blood pressure and heart disease High blood pressure causes heart disease and increases the risk of stroke. This is more likely to develop in people who have high blood pressure readings or are overweight. Have your blood pressure checked: Every 3-5 years if you are 2-40 years of age. Every year if you are 46 years old or older. Diabetes Have regular diabetes screenings. This checks your fasting blood sugar level. Have the screening done: Once every three years after age 61 if you are at a normal weight and have a low risk for diabetes. More often and at a younger age if you are overweight or have a high risk for diabetes. What should I know about preventing infection? Hepatitis B If you have a higher risk for hepatitis B, you should be screened for this virus. Talk with your health care provider to find out if you are at risk for hepatitis B infection. Hepatitis C Testing is recommended for: Everyone born from [birth year] through 1966. Anyone with known risk factors for hepatitis C. Sexually transmitted infections (STIs) Get screened for STIs, including gonorrhea and chlamydia, if: You are sexually active and are younger than 72 years of age. You are older than 72 years of age and your health care provider tells you that you are at risk for this type of infection. Your sexual activity has changed since you were last screened, and you are at increased risk for chlamydia or gonorrhea. Ask your health care provider if you are at risk. Ask your health care provider about whether you are at high risk for HIV. Your health care provider may recommend a prescription medicine to help prevent HIV infection. If you choose to take medicine to prevent HIV, you should first get tested for HIV. You should then be tested every 3 months for as long as you are taking the medicine. Pregnancy If you are about to stop having your period  (premenopausal) and you may become pregnant, seek counseling before you get pregnant. Take 400 to 800 micrograms (mcg) of folic acid every day if you become pregnant. Ask for birth control (contraception) if you want to prevent pregnancy. Osteoporosis and menopause Osteoporosis is a disease in which the bones lose minerals and strength with aging. This can result in bone fractures. If you are 61 years old or older, or if you are at risk for osteoporosis and fractures, ask your health care provider if you should: Be screened for bone loss. Take a calcium or vitamin D supplement to lower your risk of fractures. Be given hormone replacement therapy (HRT) to treat symptoms of menopause. Follow these instructions at home: Alcohol use Do not drink alcohol if: Your health care provider tells you not to drink. You are pregnant, may be pregnant, or are planning to become pregnant. If you drink alcohol: Limit how much you have to: 0-1 drink a day. Know how much alcohol is in your drink. In the [place] one drink equals one 12 oz bottle of beer (355 mL), one 5 oz glass of wine (148 mL), or one 1 oz glass of  hard liquor (44 mL). Lifestyle Do not use any products that contain nicotine or tobacco. These products include cigarettes, chewing tobacco, and vaping devices, such as e-cigarettes. If you need help quitting, ask your health care provider. Do not use street drugs. Do not share needles. Ask your health care provider for help if you need support or information about quitting drugs. General instructions Schedule regular health, dental, and eye exams. Stay current with your vaccines. Tell your health care provider if: You often feel depressed. You have ever been abused or do not feel safe at home. Summary Adopting a healthy lifestyle and getting preventive care are important in promoting health and wellness. Follow your health care provider's instructions about healthy diet, exercising, and getting  tested or screened for diseases. Follow your health care provider's instructions on monitoring your cholesterol and blood pressure. This information is not intended to replace advice given to you by your health care provider. Make sure you discuss any questions you have with your health care provider. Document Revised: 05/04/2021 Document Reviewed: 05/04/2021 Elsevier Patient Education  [place].

## 2022-06-01 DIAGNOSIS — M25551 Pain in right hip: Secondary | ICD-10-CM | POA: Diagnosis not present

## 2022-06-01 DIAGNOSIS — M25552 Pain in left hip: Secondary | ICD-10-CM | POA: Diagnosis not present

## 2022-06-01 DIAGNOSIS — M6281 Muscle weakness (generalized): Secondary | ICD-10-CM | POA: Diagnosis not present

## 2022-06-03 DIAGNOSIS — M25551 Pain in right hip: Secondary | ICD-10-CM | POA: Diagnosis not present

## 2022-06-03 DIAGNOSIS — M25552 Pain in left hip: Secondary | ICD-10-CM | POA: Diagnosis not present

## 2022-06-03 DIAGNOSIS — M6281 Muscle weakness (generalized): Secondary | ICD-10-CM | POA: Diagnosis not present

## 2022-06-05 ENCOUNTER — Other Ambulatory Visit: Payer: Self-pay | Admitting: Family Medicine

## 2022-06-08 DIAGNOSIS — M6281 Muscle weakness (generalized): Secondary | ICD-10-CM | POA: Diagnosis not present

## 2022-06-08 DIAGNOSIS — M25552 Pain in left hip: Secondary | ICD-10-CM | POA: Diagnosis not present

## 2022-06-08 DIAGNOSIS — M25551 Pain in right hip: Secondary | ICD-10-CM | POA: Diagnosis not present

## 2022-06-10 DIAGNOSIS — M6281 Muscle weakness (generalized): Secondary | ICD-10-CM | POA: Diagnosis not present

## 2022-06-10 DIAGNOSIS — M25552 Pain in left hip: Secondary | ICD-10-CM | POA: Diagnosis not present

## 2022-06-10 DIAGNOSIS — M25551 Pain in right hip: Secondary | ICD-10-CM | POA: Diagnosis not present

## 2022-06-15 DIAGNOSIS — M6281 Muscle weakness (generalized): Secondary | ICD-10-CM | POA: Diagnosis not present

## 2022-06-15 DIAGNOSIS — M25552 Pain in left hip: Secondary | ICD-10-CM | POA: Diagnosis not present

## 2022-06-15 DIAGNOSIS — M25551 Pain in right hip: Secondary | ICD-10-CM | POA: Diagnosis not present

## 2022-06-17 DIAGNOSIS — M6281 Muscle weakness (generalized): Secondary | ICD-10-CM | POA: Diagnosis not present

## 2022-06-17 DIAGNOSIS — M25552 Pain in left hip: Secondary | ICD-10-CM | POA: Diagnosis not present

## 2022-06-17 DIAGNOSIS — M25551 Pain in right hip: Secondary | ICD-10-CM | POA: Diagnosis not present

## 2022-06-28 DIAGNOSIS — M25552 Pain in left hip: Secondary | ICD-10-CM | POA: Diagnosis not present

## 2022-06-28 DIAGNOSIS — M25551 Pain in right hip: Secondary | ICD-10-CM | POA: Diagnosis not present

## 2022-06-28 DIAGNOSIS — M6281 Muscle weakness (generalized): Secondary | ICD-10-CM | POA: Diagnosis not present

## 2022-07-01 DIAGNOSIS — M6281 Muscle weakness (generalized): Secondary | ICD-10-CM | POA: Diagnosis not present

## 2022-07-01 DIAGNOSIS — M25551 Pain in right hip: Secondary | ICD-10-CM | POA: Diagnosis not present

## 2022-07-01 DIAGNOSIS — M25552 Pain in left hip: Secondary | ICD-10-CM | POA: Diagnosis not present

## 2022-07-06 DIAGNOSIS — M25551 Pain in right hip: Secondary | ICD-10-CM | POA: Diagnosis not present

## 2022-07-06 DIAGNOSIS — M6281 Muscle weakness (generalized): Secondary | ICD-10-CM | POA: Diagnosis not present

## 2022-07-06 DIAGNOSIS — M25552 Pain in left hip: Secondary | ICD-10-CM | POA: Diagnosis not present

## 2022-07-08 DIAGNOSIS — M25552 Pain in left hip: Secondary | ICD-10-CM | POA: Diagnosis not present

## 2022-07-08 DIAGNOSIS — M6281 Muscle weakness (generalized): Secondary | ICD-10-CM | POA: Diagnosis not present

## 2022-07-08 DIAGNOSIS — M25551 Pain in right hip: Secondary | ICD-10-CM | POA: Diagnosis not present

## 2022-07-13 DIAGNOSIS — M6281 Muscle weakness (generalized): Secondary | ICD-10-CM | POA: Diagnosis not present

## 2022-07-13 DIAGNOSIS — M25552 Pain in left hip: Secondary | ICD-10-CM | POA: Diagnosis not present

## 2022-07-13 DIAGNOSIS — M25551 Pain in right hip: Secondary | ICD-10-CM | POA: Diagnosis not present

## 2022-07-15 DIAGNOSIS — M6281 Muscle weakness (generalized): Secondary | ICD-10-CM | POA: Diagnosis not present

## 2022-07-15 DIAGNOSIS — M25552 Pain in left hip: Secondary | ICD-10-CM | POA: Diagnosis not present

## 2022-07-15 DIAGNOSIS — M25551 Pain in right hip: Secondary | ICD-10-CM | POA: Diagnosis not present

## 2022-07-20 DIAGNOSIS — M25552 Pain in left hip: Secondary | ICD-10-CM | POA: Diagnosis not present

## 2022-07-20 DIAGNOSIS — M6281 Muscle weakness (generalized): Secondary | ICD-10-CM | POA: Diagnosis not present

## 2022-07-20 DIAGNOSIS — M25551 Pain in right hip: Secondary | ICD-10-CM | POA: Diagnosis not present

## 2022-07-22 DIAGNOSIS — M25551 Pain in right hip: Secondary | ICD-10-CM | POA: Diagnosis not present

## 2022-07-22 DIAGNOSIS — M6281 Muscle weakness (generalized): Secondary | ICD-10-CM | POA: Diagnosis not present

## 2022-07-22 DIAGNOSIS — M25552 Pain in left hip: Secondary | ICD-10-CM | POA: Diagnosis not present

## 2022-07-27 DIAGNOSIS — M25552 Pain in left hip: Secondary | ICD-10-CM | POA: Diagnosis not present

## 2022-07-27 DIAGNOSIS — M25551 Pain in right hip: Secondary | ICD-10-CM | POA: Diagnosis not present

## 2022-07-27 DIAGNOSIS — M6281 Muscle weakness (generalized): Secondary | ICD-10-CM | POA: Diagnosis not present

## 2022-07-29 DIAGNOSIS — M6281 Muscle weakness (generalized): Secondary | ICD-10-CM | POA: Diagnosis not present

## 2022-07-29 DIAGNOSIS — M25552 Pain in left hip: Secondary | ICD-10-CM | POA: Diagnosis not present

## 2022-07-29 DIAGNOSIS — M25551 Pain in right hip: Secondary | ICD-10-CM | POA: Diagnosis not present

## 2022-08-03 DIAGNOSIS — M6281 Muscle weakness (generalized): Secondary | ICD-10-CM | POA: Diagnosis not present

## 2022-08-03 DIAGNOSIS — M25551 Pain in right hip: Secondary | ICD-10-CM | POA: Diagnosis not present

## 2022-08-03 DIAGNOSIS — M25552 Pain in left hip: Secondary | ICD-10-CM | POA: Diagnosis not present

## 2022-08-05 DIAGNOSIS — M25552 Pain in left hip: Secondary | ICD-10-CM | POA: Diagnosis not present

## 2022-08-05 DIAGNOSIS — M6281 Muscle weakness (generalized): Secondary | ICD-10-CM | POA: Diagnosis not present

## 2022-08-05 DIAGNOSIS — M25551 Pain in right hip: Secondary | ICD-10-CM | POA: Diagnosis not present

## 2022-08-10 DIAGNOSIS — M25551 Pain in right hip: Secondary | ICD-10-CM | POA: Diagnosis not present

## 2022-08-10 DIAGNOSIS — M25552 Pain in left hip: Secondary | ICD-10-CM | POA: Diagnosis not present

## 2022-08-10 DIAGNOSIS — M6281 Muscle weakness (generalized): Secondary | ICD-10-CM | POA: Diagnosis not present

## 2022-08-12 ENCOUNTER — Ambulatory Visit: Payer: Medicare PPO | Admitting: Family Medicine

## 2022-08-12 VITALS — BP 121/67 | HR 78 | Ht 64.0 in | Wt 137.0 lb

## 2022-08-12 DIAGNOSIS — M8580 Other specified disorders of bone density and structure, unspecified site: Secondary | ICD-10-CM

## 2022-08-12 DIAGNOSIS — I10 Essential (primary) hypertension: Secondary | ICD-10-CM

## 2022-08-12 DIAGNOSIS — M25551 Pain in right hip: Secondary | ICD-10-CM | POA: Diagnosis not present

## 2022-08-12 DIAGNOSIS — E7800 Pure hypercholesterolemia, unspecified: Secondary | ICD-10-CM

## 2022-08-12 DIAGNOSIS — Z0000 Encounter for general adult medical examination without abnormal findings: Secondary | ICD-10-CM

## 2022-08-12 DIAGNOSIS — M6281 Muscle weakness (generalized): Secondary | ICD-10-CM | POA: Diagnosis not present

## 2022-08-12 DIAGNOSIS — M25552 Pain in left hip: Secondary | ICD-10-CM | POA: Diagnosis not present

## 2022-08-12 NOTE — Assessment & Plan Note (Addendum)
Taking daily calcium and vitamin D.  Does exercise with weights 3 days/week.  Due for bone density in the fall.  Schedule at the same time as mammogram.

## 2022-08-12 NOTE — Assessment & Plan Note (Signed)
Due to recheck lipids.  Tolerating statin well without any problems.

## 2022-08-12 NOTE — Progress Notes (Signed)
   Established Patient Office Visit  Subjective   Patient ID: Tanya Cortez, female    DOB: [birth year]  Age: 71 y.o. MRN: [id number]  Chief Complaint  Patient presents with   Annual Exam    HPI Hyperlipidemia - tolerating stating well with no myalgias or significant side effects.  Lab Results  Component Value Date   CHOL 198 08/11/2021   HDL 77 08/11/2021   LDLCALC 102 (H) 08/11/2021   TRIG 93 08/11/2021   CHOLHDL 2.6 08/11/2021   Getting ready to travel to [place].  Plans on getting flu shot and COVID booster before she travels.     ROS    Objective:     BP 121/67   Pulse 78   Ht 5\' 4"  (1.626 m)   Wt 137 lb (62.1 kg)   SpO2 100%   BMI 23.52 kg/m    Physical Exam Vitals and nursing note reviewed.  Constitutional:      Appearance: She is well-developed.  HENT:     Head: Normocephalic and atraumatic.  Cardiovascular:     Rate and Rhythm: Normal rate and regular rhythm.     Heart sounds: Normal heart sounds.  Pulmonary:     Effort: Pulmonary effort is normal.     Breath sounds: Normal breath sounds.  Skin:    General: Skin is warm and dry.  Neurological:     Mental Status: She is alert and oriented to person, place, and time.  Psychiatric:        Behavior: Behavior normal.     No results found for any visits on 08/12/22.    The 10-year ASCVD risk score (Arnett DK, et al., 2019) is: 10.2%    Assessment & Plan:   Problem List Items Addressed This Visit       Other   Osteopenia determined by x-ray    Taking daily calcium and vitamin D.  Does exercise with weights 3 days/week.  Due for bone density in the fall.  Schedule at the same time as mammogram.      Hyperlipidemia - Primary    Due to recheck lipids.  Tolerating statin well without any problems.      Relevant Orders   Lipid panel   CBC with Differential   COMPLETE METABOLIC PANEL WITH GFR    Return in about 6 months (around 02/12/2023) for Hypertension.    02/14/2023, MD

## 2022-08-13 LAB — COMPLETE METABOLIC PANEL WITH GFR
AG Ratio: 2.3 (calc) (ref 1.0–2.5)
ALT: 13 U/L (ref 6–29)
AST: 18 U/L (ref 10–35)
Albumin: 4.5 g/dL (ref 3.6–5.1)
Alkaline phosphatase (APISO): 95 U/L (ref 37–153)
BUN: 11 mg/dL (ref 7–25)
CO2: 30 mmol/L (ref 20–32)
Calcium: 9.8 mg/dL (ref 8.6–10.4)
Chloride: 106 mmol/L (ref 98–110)
Creat: 0.86 mg/dL (ref 0.60–1.00)
Globulin: 2 g/dL (calc) (ref 1.9–3.7)
Glucose, Bld: 101 mg/dL — ABNORMAL HIGH (ref 65–99)
Potassium: 4.9 mmol/L (ref 3.5–5.3)
Sodium: 143 mmol/L (ref 135–146)
Total Bilirubin: 0.5 mg/dL (ref 0.2–1.2)
Total Protein: 6.5 g/dL (ref 6.1–8.1)
eGFR: 72 mL/min/{1.73_m2} (ref >=60)

## 2022-08-13 LAB — CBC WITH DIFFERENTIAL/PLATELET
Absolute Monocytes: 307 cells/uL (ref 200–950)
Basophils Absolute: 41 cells/uL (ref 0–200)
Basophils Relative: 0.7 %
Eosinophils Absolute: 41 cells/uL (ref 15–500)
Eosinophils Relative: 0.7 %
HCT: 40.1 % (ref 35.0–45.0)
Hemoglobin: 13.4 g/dL (ref 11.7–15.5)
Lymphs Abs: 1593 cells/uL (ref 850–3900)
MCH: 30.8 pg (ref 27.0–33.0)
MCHC: 33.4 g/dL (ref 32.0–36.0)
MCV: 92.2 fL (ref 80.0–100.0)
MPV: 11.2 fL (ref 7.5–12.5)
Monocytes Relative: 5.2 %
Neutro Abs: 3918 cells/uL (ref 1500–7800)
Neutrophils Relative %: 66.4 %
Platelets: 237 10*3/uL (ref 140–400)
RBC: 4.35 10*6/uL (ref 3.80–5.10)
RDW: 12 % (ref 11.0–15.0)
Total Lymphocyte: 27 %
WBC: 5.9 10*3/uL (ref 3.8–10.8)

## 2022-08-13 LAB — LIPID PANEL
Cholesterol: 202 mg/dL — ABNORMAL HIGH (ref <200)
HDL: 84 mg/dL (ref >=50)
LDL Cholesterol (Calc): 102 mg/dL (calc) — ABNORMAL HIGH
Non-HDL Cholesterol (Calc): 118 mg/dL (calc) (ref <130)
Total CHOL/HDL Ratio: 2.4 (calc) (ref <5.0)
Triglycerides: 71 mg/dL (ref <150)

## 2022-08-13 NOTE — Progress Notes (Signed)
HI Trish, your cholesterol is borderline but stable. Your HDL went up which is awesome!!!! Keep up the exercise. All other labs look great!

## 2022-08-18 DIAGNOSIS — M6281 Muscle weakness (generalized): Secondary | ICD-10-CM | POA: Diagnosis not present

## 2022-08-18 DIAGNOSIS — M25552 Pain in left hip: Secondary | ICD-10-CM | POA: Diagnosis not present

## 2022-08-18 DIAGNOSIS — M25551 Pain in right hip: Secondary | ICD-10-CM | POA: Diagnosis not present

## 2022-08-20 ENCOUNTER — Encounter: Payer: Self-pay | Admitting: Family Medicine

## 2022-08-20 DIAGNOSIS — Z1211 Encounter for screening for malignant neoplasm of colon: Secondary | ICD-10-CM

## 2022-08-20 DIAGNOSIS — M25551 Pain in right hip: Secondary | ICD-10-CM | POA: Diagnosis not present

## 2022-08-20 DIAGNOSIS — M25552 Pain in left hip: Secondary | ICD-10-CM | POA: Diagnosis not present

## 2022-08-20 DIAGNOSIS — M6281 Muscle weakness (generalized): Secondary | ICD-10-CM | POA: Diagnosis not present

## 2022-08-24 DIAGNOSIS — M25552 Pain in left hip: Secondary | ICD-10-CM | POA: Diagnosis not present

## 2022-08-24 DIAGNOSIS — M6281 Muscle weakness (generalized): Secondary | ICD-10-CM | POA: Diagnosis not present

## 2022-08-24 DIAGNOSIS — M25551 Pain in right hip: Secondary | ICD-10-CM | POA: Diagnosis not present

## 2022-08-26 DIAGNOSIS — M25552 Pain in left hip: Secondary | ICD-10-CM | POA: Diagnosis not present

## 2022-08-26 DIAGNOSIS — M6281 Muscle weakness (generalized): Secondary | ICD-10-CM | POA: Diagnosis not present

## 2022-08-26 DIAGNOSIS — M25551 Pain in right hip: Secondary | ICD-10-CM | POA: Diagnosis not present

## 2022-08-31 DIAGNOSIS — M6281 Muscle weakness (generalized): Secondary | ICD-10-CM | POA: Diagnosis not present

## 2022-08-31 DIAGNOSIS — M25551 Pain in right hip: Secondary | ICD-10-CM | POA: Diagnosis not present

## 2022-08-31 DIAGNOSIS — M25552 Pain in left hip: Secondary | ICD-10-CM | POA: Diagnosis not present

## 2022-09-02 DIAGNOSIS — M6281 Muscle weakness (generalized): Secondary | ICD-10-CM | POA: Diagnosis not present

## 2022-09-02 DIAGNOSIS — M25552 Pain in left hip: Secondary | ICD-10-CM | POA: Diagnosis not present

## 2022-09-02 DIAGNOSIS — M25551 Pain in right hip: Secondary | ICD-10-CM | POA: Diagnosis not present

## 2022-09-14 NOTE — Addendum Note (Signed)
Addended by: Fonnie Mu on: 09/14/2022 06:25 AM   Modules accepted: Orders

## 2022-09-21 DIAGNOSIS — M25552 Pain in left hip: Secondary | ICD-10-CM | POA: Diagnosis not present

## 2022-09-21 DIAGNOSIS — M25551 Pain in right hip: Secondary | ICD-10-CM | POA: Diagnosis not present

## 2022-09-21 DIAGNOSIS — M6281 Muscle weakness (generalized): Secondary | ICD-10-CM | POA: Diagnosis not present

## 2022-09-22 DIAGNOSIS — Z1211 Encounter for screening for malignant neoplasm of colon: Secondary | ICD-10-CM | POA: Diagnosis not present

## 2022-09-23 DIAGNOSIS — M25552 Pain in left hip: Secondary | ICD-10-CM | POA: Diagnosis not present

## 2022-09-23 DIAGNOSIS — M6281 Muscle weakness (generalized): Secondary | ICD-10-CM | POA: Diagnosis not present

## 2022-09-23 DIAGNOSIS — M25551 Pain in right hip: Secondary | ICD-10-CM | POA: Diagnosis not present

## 2022-09-28 ENCOUNTER — Encounter: Payer: Self-pay | Admitting: Family Medicine

## 2022-09-28 DIAGNOSIS — M25551 Pain in right hip: Secondary | ICD-10-CM | POA: Diagnosis not present

## 2022-09-28 DIAGNOSIS — M6281 Muscle weakness (generalized): Secondary | ICD-10-CM | POA: Diagnosis not present

## 2022-09-28 DIAGNOSIS — M25512 Pain in left shoulder: Secondary | ICD-10-CM

## 2022-09-28 DIAGNOSIS — M25552 Pain in left hip: Secondary | ICD-10-CM | POA: Diagnosis not present

## 2022-09-29 LAB — COLOGUARD: COLOGUARD: NEGATIVE

## 2022-09-29 NOTE — Progress Notes (Signed)
Great news! Your Cologuard test is negative.  Recommend repeat colon cancer screening in 3 years.

## 2022-09-30 DIAGNOSIS — M6281 Muscle weakness (generalized): Secondary | ICD-10-CM | POA: Diagnosis not present

## 2022-09-30 DIAGNOSIS — M25552 Pain in left hip: Secondary | ICD-10-CM | POA: Diagnosis not present

## 2022-09-30 DIAGNOSIS — M25551 Pain in right hip: Secondary | ICD-10-CM | POA: Diagnosis not present

## 2022-10-01 NOTE — Telephone Encounter (Signed)
Task completed. PT referral placed for [facility].

## 2022-10-04 ENCOUNTER — Other Ambulatory Visit: Payer: Self-pay | Admitting: Family Medicine

## 2022-10-04 DIAGNOSIS — Z1231 Encounter for screening mammogram for malignant neoplasm of breast: Secondary | ICD-10-CM

## 2022-10-04 DIAGNOSIS — Z780 Asymptomatic menopausal state: Secondary | ICD-10-CM

## 2022-10-12 ENCOUNTER — Encounter: Payer: Self-pay | Admitting: Family Medicine

## 2022-10-12 DIAGNOSIS — M25511 Pain in right shoulder: Secondary | ICD-10-CM

## 2022-10-12 DIAGNOSIS — M25551 Pain in right hip: Secondary | ICD-10-CM | POA: Diagnosis not present

## 2022-10-12 DIAGNOSIS — M25552 Pain in left hip: Secondary | ICD-10-CM | POA: Diagnosis not present

## 2022-10-12 DIAGNOSIS — M6281 Muscle weakness (generalized): Secondary | ICD-10-CM | POA: Diagnosis not present

## 2022-10-12 DIAGNOSIS — M25519 Pain in unspecified shoulder: Secondary | ICD-10-CM | POA: Diagnosis not present

## 2022-10-12 NOTE — Telephone Encounter (Signed)
Orders Placed This Encounter  Procedures  . Ambulatory referral to Physical Therapy    Referral Priority:   Routine    Referral Type:   Physical Medicine    Referral Reason:   Specialty Services Required    Requested Specialty:   Physical Therapy    Number of Visits Requested:   1    

## 2022-10-14 DIAGNOSIS — M6281 Muscle weakness (generalized): Secondary | ICD-10-CM | POA: Diagnosis not present

## 2022-10-14 DIAGNOSIS — M25519 Pain in unspecified shoulder: Secondary | ICD-10-CM | POA: Diagnosis not present

## 2022-10-14 DIAGNOSIS — M25551 Pain in right hip: Secondary | ICD-10-CM | POA: Diagnosis not present

## 2022-10-14 DIAGNOSIS — M25552 Pain in left hip: Secondary | ICD-10-CM | POA: Diagnosis not present

## 2022-10-19 DIAGNOSIS — M6281 Muscle weakness (generalized): Secondary | ICD-10-CM | POA: Diagnosis not present

## 2022-10-19 DIAGNOSIS — M25519 Pain in unspecified shoulder: Secondary | ICD-10-CM | POA: Diagnosis not present

## 2022-10-19 DIAGNOSIS — M25551 Pain in right hip: Secondary | ICD-10-CM | POA: Diagnosis not present

## 2022-10-19 DIAGNOSIS — M25552 Pain in left hip: Secondary | ICD-10-CM | POA: Diagnosis not present

## 2022-10-26 DIAGNOSIS — M6281 Muscle weakness (generalized): Secondary | ICD-10-CM | POA: Diagnosis not present

## 2022-10-26 DIAGNOSIS — M25519 Pain in unspecified shoulder: Secondary | ICD-10-CM | POA: Diagnosis not present

## 2022-10-26 DIAGNOSIS — M25552 Pain in left hip: Secondary | ICD-10-CM | POA: Diagnosis not present

## 2022-10-26 DIAGNOSIS — M25551 Pain in right hip: Secondary | ICD-10-CM | POA: Diagnosis not present

## 2022-10-28 DIAGNOSIS — M25519 Pain in unspecified shoulder: Secondary | ICD-10-CM | POA: Diagnosis not present

## 2022-10-28 DIAGNOSIS — M25552 Pain in left hip: Secondary | ICD-10-CM | POA: Diagnosis not present

## 2022-10-28 DIAGNOSIS — M25551 Pain in right hip: Secondary | ICD-10-CM | POA: Diagnosis not present

## 2022-10-28 DIAGNOSIS — M6281 Muscle weakness (generalized): Secondary | ICD-10-CM | POA: Diagnosis not present

## 2022-11-02 DIAGNOSIS — M6281 Muscle weakness (generalized): Secondary | ICD-10-CM | POA: Diagnosis not present

## 2022-11-02 DIAGNOSIS — M25552 Pain in left hip: Secondary | ICD-10-CM | POA: Diagnosis not present

## 2022-11-02 DIAGNOSIS — M25551 Pain in right hip: Secondary | ICD-10-CM | POA: Diagnosis not present

## 2022-11-02 DIAGNOSIS — M25519 Pain in unspecified shoulder: Secondary | ICD-10-CM | POA: Diagnosis not present

## 2022-11-04 DIAGNOSIS — M6281 Muscle weakness (generalized): Secondary | ICD-10-CM | POA: Diagnosis not present

## 2022-11-04 DIAGNOSIS — M25551 Pain in right hip: Secondary | ICD-10-CM | POA: Diagnosis not present

## 2022-11-04 DIAGNOSIS — M25519 Pain in unspecified shoulder: Secondary | ICD-10-CM | POA: Diagnosis not present

## 2022-11-04 DIAGNOSIS — M25552 Pain in left hip: Secondary | ICD-10-CM | POA: Diagnosis not present

## 2022-11-23 DIAGNOSIS — M25552 Pain in left hip: Secondary | ICD-10-CM | POA: Diagnosis not present

## 2022-11-23 DIAGNOSIS — M25551 Pain in right hip: Secondary | ICD-10-CM | POA: Diagnosis not present

## 2022-11-23 DIAGNOSIS — M6281 Muscle weakness (generalized): Secondary | ICD-10-CM | POA: Diagnosis not present

## 2022-11-23 DIAGNOSIS — M25519 Pain in unspecified shoulder: Secondary | ICD-10-CM | POA: Diagnosis not present

## 2022-11-24 ENCOUNTER — Ambulatory Visit (INDEPENDENT_AMBULATORY_CARE_PROVIDER_SITE_OTHER): Payer: Medicare PPO

## 2022-11-24 ENCOUNTER — Ambulatory Visit: Payer: Medicare PPO

## 2022-11-24 DIAGNOSIS — Z780 Asymptomatic menopausal state: Secondary | ICD-10-CM

## 2022-11-24 DIAGNOSIS — Z1231 Encounter for screening mammogram for malignant neoplasm of breast: Secondary | ICD-10-CM | POA: Diagnosis not present

## 2022-11-24 DIAGNOSIS — M8589 Other specified disorders of bone density and structure, multiple sites: Secondary | ICD-10-CM | POA: Diagnosis not present

## 2022-11-24 NOTE — Progress Notes (Signed)
Hi Trish, bone density score is -1.9 which is fantastic.  Its been pretty stable over the last several years.  Keep up the routine exercise with resistance training as well as adequate calcium and vitamin D intake.

## 2022-11-25 ENCOUNTER — Encounter: Payer: Self-pay | Admitting: Family Medicine

## 2022-11-25 NOTE — Progress Notes (Signed)
Please call patient. Normal mammogram.  Repeat in 1 year.  

## 2022-11-30 DIAGNOSIS — M6281 Muscle weakness (generalized): Secondary | ICD-10-CM | POA: Diagnosis not present

## 2022-11-30 DIAGNOSIS — M25551 Pain in right hip: Secondary | ICD-10-CM | POA: Diagnosis not present

## 2022-11-30 DIAGNOSIS — M25519 Pain in unspecified shoulder: Secondary | ICD-10-CM | POA: Diagnosis not present

## 2022-11-30 DIAGNOSIS — M25552 Pain in left hip: Secondary | ICD-10-CM | POA: Diagnosis not present

## 2022-12-07 DIAGNOSIS — M25551 Pain in right hip: Secondary | ICD-10-CM | POA: Diagnosis not present

## 2022-12-07 DIAGNOSIS — M25519 Pain in unspecified shoulder: Secondary | ICD-10-CM | POA: Diagnosis not present

## 2022-12-07 DIAGNOSIS — M6281 Muscle weakness (generalized): Secondary | ICD-10-CM | POA: Diagnosis not present

## 2022-12-07 DIAGNOSIS — M25552 Pain in left hip: Secondary | ICD-10-CM | POA: Diagnosis not present

## 2022-12-29 DIAGNOSIS — M25551 Pain in right hip: Secondary | ICD-10-CM | POA: Diagnosis not present

## 2022-12-29 DIAGNOSIS — M25519 Pain in unspecified shoulder: Secondary | ICD-10-CM | POA: Diagnosis not present

## 2022-12-29 DIAGNOSIS — M6281 Muscle weakness (generalized): Secondary | ICD-10-CM | POA: Diagnosis not present

## 2022-12-29 DIAGNOSIS — M25552 Pain in left hip: Secondary | ICD-10-CM | POA: Diagnosis not present

## 2023-01-03 ENCOUNTER — Encounter: Payer: Self-pay | Admitting: Family Medicine

## 2023-01-03 DIAGNOSIS — M25551 Pain in right hip: Secondary | ICD-10-CM | POA: Diagnosis not present

## 2023-01-03 DIAGNOSIS — M25552 Pain in left hip: Secondary | ICD-10-CM | POA: Diagnosis not present

## 2023-01-03 DIAGNOSIS — M25519 Pain in unspecified shoulder: Secondary | ICD-10-CM | POA: Diagnosis not present

## 2023-01-03 DIAGNOSIS — M6281 Muscle weakness (generalized): Secondary | ICD-10-CM | POA: Diagnosis not present

## 2023-01-10 DIAGNOSIS — M25551 Pain in right hip: Secondary | ICD-10-CM | POA: Diagnosis not present

## 2023-01-10 DIAGNOSIS — M25519 Pain in unspecified shoulder: Secondary | ICD-10-CM | POA: Diagnosis not present

## 2023-01-10 DIAGNOSIS — M25552 Pain in left hip: Secondary | ICD-10-CM | POA: Diagnosis not present

## 2023-01-10 DIAGNOSIS — M6281 Muscle weakness (generalized): Secondary | ICD-10-CM | POA: Diagnosis not present

## 2023-01-17 DIAGNOSIS — M6281 Muscle weakness (generalized): Secondary | ICD-10-CM | POA: Diagnosis not present

## 2023-01-17 DIAGNOSIS — M25552 Pain in left hip: Secondary | ICD-10-CM | POA: Diagnosis not present

## 2023-01-17 DIAGNOSIS — M25519 Pain in unspecified shoulder: Secondary | ICD-10-CM | POA: Diagnosis not present

## 2023-01-17 DIAGNOSIS — M25551 Pain in right hip: Secondary | ICD-10-CM | POA: Diagnosis not present

## 2023-01-24 DIAGNOSIS — M6281 Muscle weakness (generalized): Secondary | ICD-10-CM | POA: Diagnosis not present

## 2023-01-24 DIAGNOSIS — M25519 Pain in unspecified shoulder: Secondary | ICD-10-CM | POA: Diagnosis not present

## 2023-01-24 DIAGNOSIS — M25552 Pain in left hip: Secondary | ICD-10-CM | POA: Diagnosis not present

## 2023-01-24 DIAGNOSIS — M25551 Pain in right hip: Secondary | ICD-10-CM | POA: Diagnosis not present

## 2023-02-02 DIAGNOSIS — M25551 Pain in right hip: Secondary | ICD-10-CM | POA: Diagnosis not present

## 2023-02-02 DIAGNOSIS — M25552 Pain in left hip: Secondary | ICD-10-CM | POA: Diagnosis not present

## 2023-02-02 DIAGNOSIS — M6281 Muscle weakness (generalized): Secondary | ICD-10-CM | POA: Diagnosis not present

## 2023-02-02 DIAGNOSIS — M25519 Pain in unspecified shoulder: Secondary | ICD-10-CM | POA: Diagnosis not present

## 2023-02-07 DIAGNOSIS — M25551 Pain in right hip: Secondary | ICD-10-CM | POA: Diagnosis not present

## 2023-02-07 DIAGNOSIS — M25552 Pain in left hip: Secondary | ICD-10-CM | POA: Diagnosis not present

## 2023-02-07 DIAGNOSIS — M25519 Pain in unspecified shoulder: Secondary | ICD-10-CM | POA: Diagnosis not present

## 2023-02-07 DIAGNOSIS — M6281 Muscle weakness (generalized): Secondary | ICD-10-CM | POA: Diagnosis not present

## 2023-02-14 DIAGNOSIS — M6281 Muscle weakness (generalized): Secondary | ICD-10-CM | POA: Diagnosis not present

## 2023-02-14 DIAGNOSIS — M25519 Pain in unspecified shoulder: Secondary | ICD-10-CM | POA: Diagnosis not present

## 2023-02-14 DIAGNOSIS — M25551 Pain in right hip: Secondary | ICD-10-CM | POA: Diagnosis not present

## 2023-02-14 DIAGNOSIS — M25552 Pain in left hip: Secondary | ICD-10-CM | POA: Diagnosis not present

## 2023-02-16 ENCOUNTER — Telehealth: Payer: Self-pay | Admitting: Family Medicine

## 2023-02-16 ENCOUNTER — Ambulatory Visit: Payer: Medicare PPO | Admitting: Family Medicine

## 2023-02-16 ENCOUNTER — Encounter: Payer: Self-pay | Admitting: Family Medicine

## 2023-02-16 VITALS — BP 136/82 | HR 76 | Ht 64.0 in | Wt 137.0 lb

## 2023-02-16 DIAGNOSIS — M8580 Other specified disorders of bone density and structure, unspecified site: Secondary | ICD-10-CM | POA: Diagnosis not present

## 2023-02-16 DIAGNOSIS — R030 Elevated blood-pressure reading, without diagnosis of hypertension: Secondary | ICD-10-CM | POA: Diagnosis not present

## 2023-02-16 DIAGNOSIS — Z23 Encounter for immunization: Secondary | ICD-10-CM

## 2023-02-16 NOTE — Assessment & Plan Note (Signed)
Reviewed results together.  Per the radiology note they did not feel like the slight decline from -1.6 to -1.9 was statistically significant.  It does look like there was a typo in regard to the hip so we will contact the imaging department.  Continue with supplemental calcium and vitamin D, regular exercise with weight resistance training.

## 2023-02-16 NOTE — Telephone Encounter (Signed)
Please call [facility] radiology and let them know that there needs to be a correction on the report from her bone density that was done in November 24, 2022.  In the assessment it says that the BMD of the total mean hip shows no statistically significant change.  They did not actually scan the hips due to hip replacements.  I suspect it was supposed to be spine.  I did not catch the air until I was going over the report with the patient during her office visit today.  Please let them know.

## 2023-02-16 NOTE — Telephone Encounter (Signed)
Spoke with [facility] radiology tech. She will forward the message to the radiologist  and have them create an addendum to the report. She states she will also contact us once this has been done.

## 2023-02-16 NOTE — Progress Notes (Signed)
   Established Patient Office Visit  Subjective   Patient ID: Tanya Cortez, female    DOB: [birth year]  Age: 72 y.o. MRN: [id number]  Chief Complaint  Patient presents with   Hypertension    HPI  She is here today to just check on her blood pressures.  She went to see her dentist and they did a forearm cuff and blood pressure was around 140 so she started checking it on her own at home and brought in a log with her today.  Most of her blood pressures are in the 120s over 70s but a couple are in the low 130s.  The highest blood pressure recorded was 133/78.  She is been otherwise feeling well she has been exercising regularly she has been doing weight resistance training and avoids excess salt.       ROS    Objective:     BP 136/82 (BP Location: Left Arm, Patient Position: Sitting, Cuff Size: Normal)   Pulse 76   Ht 5' 4"$  (1.626 m)   Wt 137 lb (62.1 kg)   SpO2 99%   BMI 23.52 kg/m    Physical Exam Vitals reviewed.  Constitutional:      Appearance: She is well-developed.  HENT:     Head: Normocephalic and atraumatic.  Eyes:     Conjunctiva/sclera: Conjunctivae normal.  Cardiovascular:     Rate and Rhythm: Normal rate.  Pulmonary:     Effort: Pulmonary effort is normal.  Skin:    General: Skin is dry.     Coloration: Skin is not pale.  Neurological:     Mental Status: She is alert and oriented to person, place, and time.  Psychiatric:        Behavior: Behavior normal.      No results found for any visits on 02/16/23.    The 10-year ASCVD risk score (Arnett DK, et al., 2019) is: 14.2%    Assessment & Plan:   Problem List Items Addressed This Visit       Musculoskeletal and Integument   Osteopenia determined by x-ray - Primary    Reviewed results together.  Per the radiology note they did not feel like the slight decline from -1.6 to -1.9 was statistically significant.  It does look like there was a typo in regard to the hip so we will contact the  imaging department.  Continue with supplemental calcium and vitamin D, regular exercise with weight resistance training.      Other Visit Diagnoses     Elevated BP without diagnosis of hypertension          BP without diagnosis of hypertension-blood pressure here looks great today.  We did discuss goal of systolic pressure being under 130.  But as long as about 80% of the blood pressures look great when she is measuring them then we will just continue to follow for now encouraged her to maybe just keep an eye on and check the blood pressure weekly.  Return in about 6 months (around 08/17/2023) for yearly Wellness with labs. Beatrice Lecher, MD

## 2023-02-17 NOTE — Progress Notes (Signed)
Trish, they were able to correct the typo.  Hope you are having a great day.

## 2023-02-21 DIAGNOSIS — M25552 Pain in left hip: Secondary | ICD-10-CM | POA: Diagnosis not present

## 2023-02-21 DIAGNOSIS — M6281 Muscle weakness (generalized): Secondary | ICD-10-CM | POA: Diagnosis not present

## 2023-02-21 DIAGNOSIS — M25519 Pain in unspecified shoulder: Secondary | ICD-10-CM | POA: Diagnosis not present

## 2023-02-21 DIAGNOSIS — M25551 Pain in right hip: Secondary | ICD-10-CM | POA: Diagnosis not present

## 2023-02-28 DIAGNOSIS — M6281 Muscle weakness (generalized): Secondary | ICD-10-CM | POA: Diagnosis not present

## 2023-02-28 DIAGNOSIS — M25519 Pain in unspecified shoulder: Secondary | ICD-10-CM | POA: Diagnosis not present

## 2023-02-28 DIAGNOSIS — M25551 Pain in right hip: Secondary | ICD-10-CM | POA: Diagnosis not present

## 2023-02-28 DIAGNOSIS — M25552 Pain in left hip: Secondary | ICD-10-CM | POA: Diagnosis not present

## 2023-03-07 DIAGNOSIS — M25551 Pain in right hip: Secondary | ICD-10-CM | POA: Diagnosis not present

## 2023-03-07 DIAGNOSIS — M25519 Pain in unspecified shoulder: Secondary | ICD-10-CM | POA: Diagnosis not present

## 2023-03-07 DIAGNOSIS — M6281 Muscle weakness (generalized): Secondary | ICD-10-CM | POA: Diagnosis not present

## 2023-03-07 DIAGNOSIS — M25552 Pain in left hip: Secondary | ICD-10-CM | POA: Diagnosis not present

## 2023-03-21 DIAGNOSIS — M6281 Muscle weakness (generalized): Secondary | ICD-10-CM | POA: Diagnosis not present

## 2023-03-21 DIAGNOSIS — M25552 Pain in left hip: Secondary | ICD-10-CM | POA: Diagnosis not present

## 2023-03-21 DIAGNOSIS — M25519 Pain in unspecified shoulder: Secondary | ICD-10-CM | POA: Diagnosis not present

## 2023-03-21 DIAGNOSIS — M25551 Pain in right hip: Secondary | ICD-10-CM | POA: Diagnosis not present

## 2023-05-07 ENCOUNTER — Other Ambulatory Visit: Payer: Self-pay | Admitting: Family Medicine

## 2023-06-01 ENCOUNTER — Ambulatory Visit (INDEPENDENT_AMBULATORY_CARE_PROVIDER_SITE_OTHER): Payer: Medicare PPO | Admitting: Family Medicine

## 2023-06-01 ENCOUNTER — Other Ambulatory Visit: Payer: Self-pay | Admitting: Family Medicine

## 2023-06-01 VITALS — BP 124/82 | HR 90 | Ht 64.0 in | Wt 133.0 lb

## 2023-06-01 DIAGNOSIS — Z0000 Encounter for general adult medical examination without abnormal findings: Secondary | ICD-10-CM | POA: Diagnosis not present

## 2023-06-01 NOTE — Progress Notes (Signed)
MEDICARE ANNUAL WELLNESS VISIT  06/01/2023  Subjective:  Tanya Cortez is a 72 y.o. female patient of Metheney, Barbarann Ehlers, MD who had a Medicare Annual Wellness Visit today. Deani is Retired and lives with an adult companion. she does not have any children. she reports that she is socially active and does interact with friends/family regularly. she is markedly physically active and enjoys staying active and still volunteering at [employer] and the school.  Patient Care Team: Agapito Games, MD as PCP - General (Family Medicine)     06/01/2023    8:06 AM 05/17/2022   10:12 AM 02/20/2021    8:16 AM 02/11/2020    8:58 AM 09/21/2019   10:22 AM 09/18/2019    9:34 AM 12/15/2018    9:56 AM  Advanced Directives  Does Patient Have a Medical Advance Directive? Yes Yes Yes Yes Yes Yes No  Type of Advance Directive Living will Living will Healthcare Power of [place];Living will Healthcare Power of [place];Living will Healthcare Power of [place];Living will Healthcare Power of [place];Living will   Does patient want to make changes to medical advance directive? No - Patient declined No - Patient declined No - Patient declined No - Patient declined No - Patient declined No - Patient declined   Copy of Healthcare Power of Attorney in Chart?    No - copy requested No - copy requested No - copy requested   Would patient like information on creating a medical advance directive?       No - Patient declined    Hospital Utilization Over the Past 12 Months: # of hospitalizations or ER visits: 0 # of surgeries: 0  Review of Systems    Patient reports that her overall health is unchanged when compared to last year.  Review of Systems: History obtained from chart review and the patient  All other systems negative.  Pain Assessment Pain : No/denies pain     Current Medications & Allergies (verified) Allergies as of 06/01/2023   No Known Allergies      Medication List         Accurate as of June 01, 2023  8:16 AM. If you have any questions, ask your nurse or doctor.          alendronate 70 MG tablet Commonly known as: FOSAMAX TAKE 1 TABLET BY MOUTH EVERY FRIDAY   atorvastatin 40 MG tablet Commonly known as: LIPITOR Take 1 tablet by mouth once daily   Caltrate 600+D Plus Minerals 600-800 MG-UNIT Tabs Take 1 tablet by mouth 2 (two) times daily.   Omega-3 1000 MG Caps Take by mouth.        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   Procedure: LEFT TOTAL HIP ARTHROPLASTY ANTERIOR APPROACH;  Surgeon: Kathryne Hitch, MD;  Location: [facility];  Service: Orthopedics;  Laterality: Left;   TOTAL HIP ARTHROPLASTY Right 09/21/2019   Procedure: RIGHT TOTAL HIP ARTHROPLASTY ANTERIOR APPROACH;  Surgeon: Kathryne Hitch, MD;  Location: [facility];  Service: Orthopedics;  Laterality: Right;  Family History  Problem Relation Age of Onset   Skin cancer Mother        squamous cell    Hypertension Mother    Breast cancer Mother    COPD Mother    Cancer Mother    Hearing loss Mother    Heart disease Mother    Heart attack Maternal Grandfather    Stroke Maternal Grandfather    Heart disease Maternal Grandfather    Heart attack Father        died 57 yo   Heart disease Father    Vision loss Father    Hypertension Sister    Cancer Sister    Heart disease Sister    Social History   Socioeconomic History   Marital status: Single    Spouse name: Not on file   Number of children: 0   Years of education: 16   Highest education  level: Professional school degree (e.g., MD, DDS, DVM, JD)  Occupational History   Occupation: high school principal    Comment: [employer]   Occupation: Retired  Tobacco Use   Smoking status: Never   Smokeless tobacco: Never  Building services engineer Use: Never used  Substance and Sexual Activity   Alcohol use: No   Drug use: No   Sexual activity: Never  Other Topics Concern   Not on file  Social History Narrative   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  She has a degree in education specialty.  Lives with [name].  Plays golf 2-3 times a week and works out with a trainer 3 times a week for 30 mins.   Social Determinants of Health   Financial Resource Strain: Low Risk  (05/30/2023)   Overall Financial Resource Strain (CARDIA)    Difficulty of Paying Living Expenses: Not hard at all  Food Insecurity: No Food Insecurity (05/30/2023)   Hunger Vital Sign    Worried About Running Out of Food in the Last Year: Never true    Ran Out of Food in the Last Year: Never true  Transportation Needs: No Transportation Needs (05/30/2023)   PRAPARE - Administrator, Civil Service (Medical): No    Lack of Transportation (Non-Medical): No  Physical Activity: Sufficiently Active (05/30/2023)   Exercise Vital Sign    Days of Exercise per Week: 7 days    Minutes of Exercise per Session: 30 min  Stress: No Stress Concern Present (05/30/2023)   [employer] of Occupational Health - Occupational Stress Questionnaire    Feeling of Stress : Not at all  Social Connections: Moderately Integrated (06/01/2023)   Social Connection and Isolation Panel [NHANES]    Frequency of Communication with Friends and Family: More than three times a week    Frequency of Social Gatherings with Friends and Family: More than three times a week    Attends Religious Services: More than 4 times per year    Active Member of [employer] or Organizations: Yes    Attends Banker Meetings: More than 4 times per year     Marital Status: Never married    Activities of Daily Living    05/30/2023    6:44 PM  In your present state of health, do you have any difficulty performing the following activities:  Hearing? 0  Vision? 0  Difficulty concentrating or making decisions? 0  Walking or climbing stairs? 0  Dressing or bathing? 0  Doing errands, shopping? 0  Preparing Food and eating ? N  Using the Toilet? N  In the past six months, have you accidently leaked urine? N  Do you have problems with loss of bowel control? N  Managing your Medications? N  Managing your Finances? N  Housekeeping or managing your Housekeeping? N    Patient Education/Literacy How often do you need to have someone help you when you read instructions, pamphlets, or other written materials from your doctor or pharmacy?: 1 - Never What is the last grade level you completed in school?: Doctorate  Exercise Current Exercise Habits: Home exercise routine, Type of exercise: stretching;Other - see comments (stretching; golf and pickle ball), Time (Minutes): 30, Frequency (Times/Week): 7, Weekly Exercise (Minutes/Week): 210, Intensity: Moderate, Exercise limited by: None identified  Diet Patient reports consuming 3 meals a day and 1 snack(s) a day Patient reports that her primary diet is: Regular Patient reports that she does have regular access to food.   Depression Screen    06/01/2023    8:06 AM 02/16/2023    8:15 AM 08/12/2022    8:10 AM 05/17/2022   10:16 AM 08/11/2021    8:09 AM 02/20/2021    8:17 AM 02/11/2020    9:00 AM  PHQ 2/9 Scores  PHQ - 2 Score 0 0 0 0 0 0 0  PHQ- 9 Score      0      Fall Risk    06/01/2023    8:06 AM 05/30/2023    6:44 PM 02/16/2023    8:15 AM 08/12/2022    8:09 AM 05/12/2022   12:47 PM  Fall Risk   Falls in the past year? 0 0 0 0 0  Number falls in past yr: 0 0 0 0 0  Injury with Fall? 0 0 0 0 0  Risk for fall due to : No Fall Risks  No Fall Risks No Fall Risks No Fall Risks  Follow up Falls  evaluation completed  Falls evaluation completed Falls evaluation completed Falls evaluation completed     Objective:   BP 124/82 (BP Location: Right Arm, Patient Position: Sitting, Cuff Size: Normal)   Pulse 90   Ht 5\' 4"  (1.626 m)   Wt 133 lb 0.6 oz (60.3 kg)   SpO2 97%   BMI 22.84 kg/m   Last Weight  Most recent update: 06/01/2023  8:03 AM    Weight  60.3 kg (133 lb 0.6 oz)             Body mass index is 22.84 kg/m.  Hearing/Vision  Tannah did not have difficulty with hearing/understanding during the face-to-face interview Laikyn did not have difficulty with her vision during the face-to-face interview Reports that she has had a formal eye exam by an eye care professional within the past year Reports that she has not had a formal hearing evaluation within the past year  Cognitive Function:    06/01/2023    8:09 AM 05/17/2022   10:17 AM 02/20/2021    8:25 AM 02/11/2020    9:04 AM  6CIT Screen  What Year? 0 points 0 points 0 points 4 points  What month? 0 points 0 points 0 points 0 points  What time? 0 points 0 points 0 points 0 points  Count back from 20 0 points 0 points 0 points 0 points  Months in reverse 0 points 0 points 2 points 0 points  Repeat phrase 0 points 0 points 0 points 0 points  Total Score 0 points 0 points 2 points 4 points  Normal Cognitive Function Screening: Yes (Normal:0-7, Significant for Dysfunction: >8)  Immunization & Health Maintenance Record Immunization History  Administered Date(s) Administered   COVID-19, mRNA, vaccine(Comirnaty)12 years and older 12/31/2022   Fluad Quad(high Dose 65+) 08/14/2019   Influenza, High Dose Seasonal PF 09/10/2018, 08/20/2022   Influenza,inj,Quad PF,6+ Mos 12/17/2014, 12/15/2015   Influenza-Unspecified 09/21/2016, 12/10/2017, 10/12/2020, 09/25/2021   Moderna Covid-19 Vaccine Bivalent Booster 48yrs & up 08/20/2022   PFIZER(Purple Top)SARS-COV-2 Vaccination 01/19/2020, 02/09/2020, 10/12/2020,  04/10/2021, 09/25/2021   PNEUMOCOCCAL CONJUGATE-20 02/16/2023   Pneumococcal Conjugate-13 06/12/2015   Pneumococcal Polysaccharide-23 08/10/2016   Respiratory Syncytial Virus Vaccine,Recomb Aduvanted(Arexvy) 08/20/2022   Tdap 05/08/2014, 09/21/2016   Zoster Recombinat (Shingrix) 09/21/2017, 01/06/2018   Zoster, Live 05/08/2014    Health Maintenance  Topic Date Due   INFLUENZA VACCINE  07/28/2023   Medicare Annual Wellness (AWV)  05/31/2024   MAMMOGRAM  11/24/2024   Fecal DNA (Cologuard)  09/22/2025   DTaP/Tdap/Td (3 - Td or Tdap) 09/21/2026   Pneumonia Vaccine 51+ Years old  Completed   DEXA SCAN  Completed   COVID-19 Vaccine  Completed   Hepatitis C Screening  Completed   Zoster Vaccines- Shingrix  Completed   HPV VACCINES  Aged Out       Assessment  This is a routine wellness examination for [employer].  Health Maintenance: Due or Overdue There are no preventive care reminders to display for this patient.   [name] does not need a referral for Community Assistance: Care Management:   no Social Work:    no Prescription Assistance:  no Nutrition/Diabetes Education:  no   Plan:  Personalized Goals  Goals Addressed               This Visit's Progress     Patient Stated (pt-stated)        Patient stated that she would like to continue to maintain current healthy style.       Personalized Health Maintenance & Screening Recommendations  There are no preventive care reminders to display for this patient.  Lung Cancer Screening Recommended: no (Low Dose CT Chest recommended if Age 11-80 years, 20 pack-year currently smoking OR have quit w/in past 15 years) Hepatitis C Screening recommended: unknown HIV Screening recommended: no  Advanced Directives: Written information was not given per the patient's request.  Referrals & Orders No orders of the defined types were placed in this encounter.   Follow-up Plan Follow-up with Agapito Games, MD as planned Medicare wellness visit in one year.  AVS printed and mailed to the patient.   I have personally reviewed and noted the following in the patient's chart:   Medical and social history Use of alcohol, tobacco or illicit drugs  Current medications and supplements Functional ability and status Nutritional status Physical activity Advanced directives List of other physicians Hospitalizations, surgeries, and ER visits in previous 12 months Vitals Screenings to include cognitive, depression, and falls Referrals and appointments  In addition, I have reviewed and discussed with patient certain preventive protocols, quality metrics, and best practice recommendations. A written personalized care plan for preventive services as well as general preventive health recommendations were provided to patient.     Modesto Charon, RN BSN  06/01/2023

## 2023-06-01 NOTE — Patient Instructions (Addendum)
MEDICARE ANNUAL WELLNESS VISIT Health Maintenance Summary and Written Plan of Care  [name] ,  Thank you for allowing me to perform your Medicare Annual Wellness Visit and for your ongoing commitment to your health.   Health Maintenance & Immunization History Health Maintenance  Topic Date Due   INFLUENZA VACCINE  07/28/2023   Medicare Annual Wellness (AWV)  05/31/2024   MAMMOGRAM  11/24/2024   Fecal DNA (Cologuard)  09/22/2025   DTaP/Tdap/Td (3 - Td or Tdap) 09/21/2026   Pneumonia Vaccine 23+ Years old  Completed   DEXA SCAN  Completed   COVID-19 Vaccine  Completed   Hepatitis C Screening  Completed   Zoster Vaccines- Shingrix  Completed   HPV VACCINES  Aged Out   Immunization History  Administered Date(s) Administered   COVID-19, mRNA, vaccine(Comirnaty)12 years and older 12/31/2022   Fluad Quad(high Dose 65+) 08/14/2019   Influenza, High Dose Seasonal PF 09/10/2018, 08/20/2022   Influenza,inj,Quad PF,6+ Mos 12/17/2014, 12/15/2015   Influenza-Unspecified 09/21/2016, 12/10/2017, 10/12/2020, 09/25/2021   Moderna Covid-19 Vaccine Bivalent Booster 43yrs & up 08/20/2022   PFIZER(Purple Top)SARS-COV-2 Vaccination 01/19/2020, 02/09/2020, 10/12/2020, 04/10/2021, 09/25/2021   PNEUMOCOCCAL CONJUGATE-20 02/16/2023   Pneumococcal Conjugate-13 06/12/2015   Pneumococcal Polysaccharide-23 08/10/2016   Respiratory Syncytial Virus Vaccine,Recomb Aduvanted(Arexvy) 08/20/2022   Tdap 05/08/2014, 09/21/2016   Zoster Recombinat (Shingrix) 09/21/2017, 01/06/2018   Zoster, Live 05/08/2014    These are the patient goals that we discussed:  Goals Addressed               This Visit's Progress     Patient Stated (pt-stated)        Patient stated that she would like to continue to maintain current healthy style.         This is a list of Health Maintenance Items that are overdue or due now: There are no preventive care reminders to display for this patient.    Orders/Referrals  Placed Today: No orders of the defined types were placed in this encounter.  (Contact our referral department at [phone] if you have not spoken with someone about your referral appointment within the next 5 days)    Follow-up Plan Follow-up with Agapito Games, MD as planned Medicare wellness visit in one year.  AVS printed and mailed to the patient.      Health Maintenance, Female Adopting a healthy lifestyle and getting preventive care are important in promoting health and wellness. Ask your health care provider about: The right schedule for you to have regular tests and exams. Things you can do on your own to prevent diseases and keep yourself healthy. What should I know about diet, weight, and exercise? Eat a healthy diet  Eat a diet that includes plenty of vegetables, fruits, low-fat dairy products, and lean protein. Do not eat a lot of foods that are high in solid fats, added sugars, or sodium. Maintain a healthy weight Body mass index (BMI) is used to identify weight problems. It estimates body fat based on height and weight. Your health care provider can help determine your BMI and help you achieve or maintain a healthy weight. Get regular exercise Get regular exercise. This is one of the most important things you can do for your health. Most adults should: Exercise for at least 150 minutes each week. The exercise should increase your heart rate and make you sweat (moderate-intensity exercise). Do strengthening exercises at least twice a week. This is in addition to the moderate-intensity exercise. Spend less time sitting. Even  light physical activity can be beneficial. Watch cholesterol and blood lipids Have your blood tested for lipids and cholesterol at 73 years of age, then have this test every 5 years. Have your cholesterol levels checked more often if: Your lipid or cholesterol levels are high. You are older than 73 years of age. You are at high risk for  heart disease. What should I know about cancer screening? Depending on your health history and family history, you may need to have cancer screening at various ages. This may include screening for: Breast cancer. Cervical cancer. Colorectal cancer. Skin cancer. Lung cancer. What should I know about heart disease, diabetes, and high blood pressure? Blood pressure and heart disease High blood pressure causes heart disease and increases the risk of stroke. This is more likely to develop in people who have high blood pressure readings or are overweight. Have your blood pressure checked: Every 3-5 years if you are 4-56 years of age. Every year if you are 6 years old or older. Diabetes Have regular diabetes screenings. This checks your fasting blood sugar level. Have the screening done: Once every three years after age 45 if you are at a normal weight and have a low risk for diabetes. More often and at a younger age if you are overweight or have a high risk for diabetes. What should I know about preventing infection? Hepatitis B If you have a higher risk for hepatitis B, you should be screened for this virus. Talk with your health care provider to find out if you are at risk for hepatitis B infection. Hepatitis C Testing is recommended for: Everyone born from [birth year] through 1965. Anyone with known risk factors for hepatitis C. Sexually transmitted infections (STIs) Get screened for STIs, including gonorrhea and chlamydia, if: You are sexually active and are younger than 73 years of age. You are older than 72 years of age and your health care provider tells you that you are at risk for this type of infection. Your sexual activity has changed since you were last screened, and you are at increased risk for chlamydia or gonorrhea. Ask your health care provider if you are at risk. Ask your health care provider about whether you are at high risk for HIV. Your health care provider may recommend a  prescription medicine to help prevent HIV infection. If you choose to take medicine to prevent HIV, you should first get tested for HIV. You should then be tested every 3 months for as long as you are taking the medicine. Pregnancy If you are about to stop having your period (premenopausal) and you may become pregnant, seek counseling before you get pregnant. Take 400 to 800 micrograms (mcg) of folic acid every day if you become pregnant. Ask for birth control (contraception) if you want to prevent pregnancy. Osteoporosis and menopause Osteoporosis is a disease in which the bones lose minerals and strength with aging. This can result in bone fractures. If you are 55 years old or older, or if you are at risk for osteoporosis and fractures, ask your health care provider if you should: Be screened for bone loss. Take a calcium or vitamin D supplement to lower your risk of fractures. Be given hormone replacement therapy (HRT) to treat symptoms of menopause. Follow these instructions at home: Alcohol use Do not drink alcohol if: Your health care provider tells you not to drink. You are pregnant, may be pregnant, or are planning to become pregnant. If you drink alcohol: Limit how much  you have to: 0-1 drink a day. Know how much alcohol is in your drink. In the [place] one drink equals one 12 oz bottle of beer (355 mL), one 5 oz glass of wine (148 mL), or one 1 oz glass of hard liquor (44 mL). Lifestyle Do not use any products that contain nicotine or tobacco. These products include cigarettes, chewing tobacco, and vaping devices, such as e-cigarettes. If you need help quitting, ask your health care provider. Do not use street drugs. Do not share needles. Ask your health care provider for help if you need support or information about quitting drugs. General instructions Schedule regular health, dental, and eye exams. Stay current with your vaccines. Tell your health care provider if: You often  feel depressed. You have ever been abused or do not feel safe at home. Summary Adopting a healthy lifestyle and getting preventive care are important in promoting health and wellness. Follow your health care provider's instructions about healthy diet, exercising, and getting tested or screened for diseases. Follow your health care provider's instructions on monitoring your cholesterol and blood pressure. This information is not intended to replace advice given to you by your health care provider. Make sure you discuss any questions you have with your health care provider. Document Revised: 05/04/2021 Document Reviewed: 05/04/2021 Elsevier Patient Education  2024 [employer].

## 2023-06-27 ENCOUNTER — Other Ambulatory Visit: Payer: Self-pay | Admitting: Family Medicine

## 2023-07-23 ENCOUNTER — Other Ambulatory Visit: Payer: Self-pay | Admitting: Family Medicine

## 2023-08-03 ENCOUNTER — Ambulatory Visit (INDEPENDENT_AMBULATORY_CARE_PROVIDER_SITE_OTHER): Payer: Medicare PPO | Admitting: Family Medicine

## 2023-08-03 ENCOUNTER — Encounter: Payer: Self-pay | Admitting: Family Medicine

## 2023-08-03 VITALS — BP 131/64 | HR 75 | Ht 64.0 in | Wt 135.0 lb

## 2023-08-03 DIAGNOSIS — E7800 Pure hypercholesterolemia, unspecified: Secondary | ICD-10-CM

## 2023-08-03 DIAGNOSIS — I10 Essential (primary) hypertension: Secondary | ICD-10-CM | POA: Diagnosis not present

## 2023-08-03 DIAGNOSIS — Z0000 Encounter for general adult medical examination without abnormal findings: Secondary | ICD-10-CM | POA: Diagnosis not present

## 2023-08-03 DIAGNOSIS — M8580 Other specified disorders of bone density and structure, unspecified site: Secondary | ICD-10-CM | POA: Diagnosis not present

## 2023-08-03 DIAGNOSIS — Z23 Encounter for immunization: Secondary | ICD-10-CM

## 2023-08-03 MED ORDER — ATORVASTATIN CALCIUM 40 MG PO TABS: 40.0000 mg | ORAL_TABLET | Freq: Every day | ORAL | 3 refills | Status: DC

## 2023-08-03 NOTE — Assessment & Plan Note (Signed)
Will get updated vitamin D level.  Last DEXA T-score -1.9 back in 2023.

## 2023-08-03 NOTE — Assessment & Plan Note (Signed)
Will get updated lab work today.

## 2023-08-03 NOTE — Progress Notes (Signed)
Complete physical exam  Patient: Tanya Cortez   DOB: 27-Mar-1951   72 y.o. Female  MRN: [id number]  Subjective:    Chief Complaint  Patient presents with   Annual Exam    Tanya Cortez is a 72 y.o. female who presents today for a complete physical exam. She reports consuming a general diet.  Staying active and exercising.   She generally feels well. She reports sleeping well. She does not have additional problems to discuss today.    Most recent fall risk assessment:    08/03/2023    8:42 AM  Fall Risk   Falls in the past year? 0  Number falls in past yr: 0  Injury with Fall? 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   Procedure: LEFT TOTAL HIP ARTHROPLASTY ANTERIOR APPROACH;  Surgeon: Kathryne Hitch, MD;  Location: [facility];  Service: Orthopedics;  Laterality: Left;   TOTAL HIP ARTHROPLASTY Right 09/21/2019   Procedure: RIGHT TOTAL HIP ARTHROPLASTY ANTERIOR APPROACH;  Surgeon: Kathryne Hitch, MD;  Location: [facility];  Service: Orthopedics;  Laterality: Right;   Social History   Tobacco Use   Smoking status: Never   Smokeless tobacco: Never  Vaping Use   Vaping status: Never Used  Substance Use Topics   Alcohol use: No   Drug use: No      Patient Care Team: Agapito Games, MD as PCP - General (Family Medicine)   Outpatient Medications Prior to Visit  Medication Sig   alendronate (FOSAMAX) 70 MG tablet TAKE 1 TABLET BY MOUTH EVERY FRIDAY   Calcium Carbonate-Vit D-Min (CALTRATE 600+D PLUS MINERALS) 600-800 MG-UNIT TABS Take 1 tablet by mouth 2 (two) times daily.   Omega-3 1000 MG CAPS Take by mouth.   [DISCONTINUED] atorvastatin  (LIPITOR) 40 MG tablet Take 1 tablet by mouth once daily   No facility-administered medications prior to visit.    ROS        Objective:     BP 131/64   Pulse 75   Ht 5\' 4"  (1.626 m)   Wt 135 lb (61.2 kg)   SpO2 99%   BMI 23.17 kg/m     Physical Exam Vitals and nursing note reviewed.  Constitutional:      Appearance: She is well-developed.  HENT:     Head: Normocephalic and atraumatic.  Cardiovascular:     Rate and Rhythm: Normal rate and regular rhythm.     Heart sounds: Normal heart sounds.  Pulmonary:     Effort: Pulmonary effort is normal.     Breath sounds: Normal breath sounds.  Skin:    General: Skin is warm and dry.  Neurological:     Mental Status: She is alert and oriented to person, place, and time.  Psychiatric:        Behavior: Behavior normal.      No results found for any visits on 08/03/23.      Assessment & Plan:    Routine Health Maintenance and Physical Exam  Immunization History  Administered Date(s) Administered  COVID-19, mRNA, vaccine(Comirnaty)12 years and older 12/31/2022   Fluad Quad(high Dose 65+) 08/14/2019   Influenza, High Dose Seasonal PF 09/10/2018, 08/20/2022   Influenza,inj,Quad PF,6+ Mos 12/17/2014, 12/15/2015   Influenza-Unspecified 09/21/2016, 12/10/2017, 10/12/2020, 09/25/2021   Moderna Covid-19 Vaccine Bivalent Booster 24yrs & up 08/20/2022   PFIZER(Purple Top)SARS-COV-2 Vaccination 01/19/2020, 02/09/2020, 10/12/2020, 04/10/2021, 09/25/2021   PNEUMOCOCCAL CONJUGATE-20 02/16/2023   Pneumococcal Conjugate-13 06/12/2015   Pneumococcal Polysaccharide-23 08/10/2016   Respiratory Syncytial Virus Vaccine,Recomb Aduvanted(Arexvy) 08/20/2022   Tdap 05/08/2014, 09/21/2016   Zoster Recombinant(Shingrix) 09/21/2017, 01/06/2018   Zoster, Live 05/08/2014    Health Maintenance  Topic Date Due   COVID-19 Vaccine (8 - 2023-24 season) 05/01/2023   INFLUENZA VACCINE  07/28/2023   Medicare Annual Wellness (AWV)  05/31/2024    MAMMOGRAM  11/24/2024   Fecal DNA (Cologuard)  09/22/2025   DTaP/Tdap/Td (3 - Td or Tdap) 09/21/2026   Pneumonia Vaccine 70+ Years old  Completed   DEXA SCAN  Completed   Hepatitis C Screening  Completed   Zoster Vaccines- Shingrix  Completed   HPV VACCINES  Aged Out    Discussed health benefits of physical activity, and encouraged her to engage in regular exercise appropriate for her age and condition.  Problem List Items Addressed This Visit       Musculoskeletal and Integument   Osteopenia determined by x-ray    Will get updated vitamin D level.  Last DEXA T-score -1.9 back in 2023.      Relevant Orders   Vitamin D (25 hydroxy)     Other   Hyperlipidemia    Will get updated lab work today.      Relevant Medications   atorvastatin (LIPITOR) 40 MG tablet   Other Relevant Orders   CMP14+EGFR   CBC   Lipid Panel With LDL/HDL Ratio   Vitamin D (25 hydroxy)   Other Visit Diagnoses     Wellness examination    -  Primary   Essential hypertension, benign       Relevant Medications   atorvastatin (LIPITOR) 40 MG tablet   Other Relevant Orders   CMP14+EGFR   CBC   Lipid Panel With LDL/HDL Ratio   Vitamin D (25 hydroxy)       Keep up a regular exercise program and make sure you are eating a healthy diet Try to eat 4 servings of dairy a day, or if you are lactose intolerant take a calcium with vitamin D daily.  Your vaccines are up to date.   Return in about 1 year (around 08/02/2024) for Wellness Exam.     Nani Gasser, MD

## 2023-08-04 NOTE — Progress Notes (Signed)
Tanya Cortez, alkaline phosphatase level was only off by 1 point not in a concerning or worrisome range.  Will keep an eye on it.  Blood count looks good no sign of anemia.  LDL is under 100 which is good.  Vitamin D is great at 22.

## 2023-09-09 ENCOUNTER — Encounter: Payer: Self-pay | Admitting: Family Medicine

## 2023-10-24 ENCOUNTER — Other Ambulatory Visit: Payer: Self-pay | Admitting: Family Medicine

## 2023-10-24 DIAGNOSIS — Z1231 Encounter for screening mammogram for malignant neoplasm of breast: Secondary | ICD-10-CM

## 2023-12-14 ENCOUNTER — Ambulatory Visit: Payer: Medicare PPO

## 2023-12-14 DIAGNOSIS — Z1231 Encounter for screening mammogram for malignant neoplasm of breast: Secondary | ICD-10-CM

## 2023-12-16 NOTE — Progress Notes (Signed)
Please call patient. Normal mammogram.  Repeat in 1 year.  

## 2024-02-08 ENCOUNTER — Encounter: Payer: Self-pay | Admitting: Family Medicine

## 2024-02-08 ENCOUNTER — Ambulatory Visit: Payer: Medicare PPO | Admitting: Family Medicine

## 2024-02-08 VITALS — BP 122/77 | HR 80 | Ht 64.0 in | Wt 134.0 lb

## 2024-02-08 DIAGNOSIS — E7800 Pure hypercholesterolemia, unspecified: Secondary | ICD-10-CM

## 2024-02-08 DIAGNOSIS — M8580 Other specified disorders of bone density and structure, unspecified site: Secondary | ICD-10-CM | POA: Diagnosis not present

## 2024-02-08 NOTE — Progress Notes (Signed)
   Established Patient Office Visit  Subjective  Patient ID: Tanya Cortez, female    DOB: May 15, 1951  Age: 73 y.o. MRN: [id number]  Chief Complaint  Patient presents with   Hyperlipidemia    HPI  6 mo f/u doing well on current regimen staying active.  She volunteers at the [facility] and help soup with her taxes.  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  No recent chest pain or shortness of breath.     ROS    Objective:     BP 122/77   Pulse 80   Ht 5\' 4"  (1.626 m)   Wt 134 lb (60.8 kg)   SpO2 100%   BMI 23.00 kg/m    Physical Exam Vitals and nursing note reviewed.  Constitutional:      Appearance: Normal appearance.  HENT:     Head: Normocephalic and atraumatic.  Eyes:     Conjunctiva/sclera: Conjunctivae normal.  Cardiovascular:     Rate and Rhythm: Normal rate and regular rhythm.  Pulmonary:     Effort: Pulmonary effort is normal.     Breath sounds: Normal breath sounds.  Skin:    General: Skin is warm and dry.  Neurological:     Mental Status: She is alert.  Psychiatric:        Mood and Affect: Mood normal.      No results found for any visits on 02/08/24.    The 10-year ASCVD risk score (Arnett DK, et al., 2019) is: 13%    Assessment & Plan:   Problem List Items Addressed This Visit       Musculoskeletal and Integument   Osteopenia determined by x-ray   She is on Fosamax.  Last bone density was in 01/2023 so she is up-to-date and was stable from prior.  Continue current regimen continue to stay active.  With calcium and vitamin D.        Other   Hyperlipidemia - Primary   Recheck liver function.  Concerns with medications.  She is staying active.      Relevant Orders   CMP14+EGFR   Lipid panel    Return in about 6 months (around 08/13/2024) for Wellness Exam.    Nani Gasser, MD

## 2024-02-08 NOTE — Assessment & Plan Note (Addendum)
Recheck liver function.  Concerns with medications.  She is staying active.

## 2024-02-08 NOTE — Addendum Note (Signed)
Addended by: Nani Gasser D on: 02/08/2024 02:33 PM   Modules accepted: Level of Service

## 2024-02-08 NOTE — Assessment & Plan Note (Addendum)
She is on Fosamax.  Last bone density was in 01/2023 so she is up-to-date and was stable from prior.  Continue current regimen continue to stay active.  With calcium and vitamin D.

## 2024-02-09 ENCOUNTER — Encounter: Payer: Self-pay | Admitting: Family Medicine

## 2024-02-09 LAB — CMP14+EGFR
ALT: 14 [IU]/L (ref 0–32)
AST: 22 [IU]/L (ref 0–40)
Albumin: 4.5 g/dL (ref 3.8–4.8)
Alkaline Phosphatase: 107 [IU]/L (ref 44–121)
BUN/Creatinine Ratio: 17 (ref 12–28)
BUN: 13 mg/dL (ref 8–27)
Bilirubin Total: 0.5 mg/dL (ref 0.0–1.2)
CO2: 28 mmol/L (ref 20–29)
Calcium: 9.9 mg/dL (ref 8.7–10.3)
Chloride: 102 mmol/L (ref 96–106)
Creatinine, Ser: 0.76 mg/dL (ref 0.57–1.00)
Globulin, Total: 1.9 g/dL (ref 1.5–4.5)
Glucose: 95 mg/dL (ref 70–99)
Potassium: 4.5 mmol/L (ref 3.5–5.2)
Sodium: 143 mmol/L (ref 134–144)
Total Protein: 6.4 g/dL (ref 6.0–8.5)
eGFR: 82 mL/min/{1.73_m2} (ref >59)

## 2024-02-09 LAB — LIPID PANEL
Chol/HDL Ratio: 2.8 {ratio} (ref 0.0–4.4)
Cholesterol, Total: 168 mg/dL (ref 100–199)
HDL: 59 mg/dL (ref >39)
LDL Chol Calc (NIH): 94 mg/dL (ref 0–99)
Triglycerides: 82 mg/dL (ref 0–149)
VLDL Cholesterol Cal: 15 mg/dL (ref 5–40)

## 2024-02-09 NOTE — Progress Notes (Signed)
Your lab work is within acceptable range and there are no concerning findings.   ?

## 2024-04-07 ENCOUNTER — Other Ambulatory Visit: Payer: Self-pay | Admitting: Family Medicine

## 2024-04-22 ENCOUNTER — Encounter (INDEPENDENT_AMBULATORY_CARE_PROVIDER_SITE_OTHER): Payer: Self-pay | Admitting: Family Medicine

## 2024-04-22 DIAGNOSIS — J329 Chronic sinusitis, unspecified: Secondary | ICD-10-CM

## 2024-04-24 MED ORDER — AMOXICILLIN-POT CLAVULANATE 875-125 MG PO TABS: 1.0000 | ORAL_TABLET | Freq: Two times a day (BID) | ORAL | 0 refills | Status: DC

## 2024-04-24 NOTE — Telephone Encounter (Signed)
 Please see the MyChart message reply(ies) for my assessment and plan.    This patient gave consent for this Medical Advice Message and is aware that it may result in a bill to [employer], as well as the possibility of receiving a bill for a co-payment or deductible. They are an established patient, but are not seeking medical advice exclusively about a problem treated during an in person or video visit in the last seven days. I did not recommend an in person or video visit within seven days of my reply.    I spent a total of 5 minutes cumulative time within 7 days through [employer].  Nani Gasser, MD

## 2024-06-26 ENCOUNTER — Other Ambulatory Visit: Payer: Self-pay | Admitting: Family Medicine

## 2024-06-28 ENCOUNTER — Other Ambulatory Visit: Payer: Self-pay | Admitting: Family Medicine

## 2024-08-07 ENCOUNTER — Ambulatory Visit (INDEPENDENT_AMBULATORY_CARE_PROVIDER_SITE_OTHER): Payer: Medicare PPO | Admitting: Family Medicine

## 2024-08-07 VITALS — BP 125/68 | HR 74 | Ht 64.0 in | Wt 134.0 lb

## 2024-08-07 DIAGNOSIS — L989 Disorder of the skin and subcutaneous tissue, unspecified: Secondary | ICD-10-CM

## 2024-08-07 DIAGNOSIS — R7309 Other abnormal glucose: Secondary | ICD-10-CM | POA: Diagnosis not present

## 2024-08-07 DIAGNOSIS — M8580 Other specified disorders of bone density and structure, unspecified site: Secondary | ICD-10-CM | POA: Diagnosis not present

## 2024-08-07 DIAGNOSIS — Z0000 Encounter for general adult medical examination without abnormal findings: Secondary | ICD-10-CM | POA: Diagnosis not present

## 2024-08-07 DIAGNOSIS — Z1231 Encounter for screening mammogram for malignant neoplasm of breast: Secondary | ICD-10-CM | POA: Diagnosis not present

## 2024-08-07 DIAGNOSIS — E7800 Pure hypercholesterolemia, unspecified: Secondary | ICD-10-CM

## 2024-08-07 MED ORDER — ATORVASTATIN CALCIUM 40 MG PO TABS: 40.0000 mg | ORAL_TABLET | Freq: Every day | ORAL | 3 refills | Status: AC

## 2024-08-07 NOTE — Progress Notes (Signed)
 Complete physical exam  Patient: Tanya Cortez   DOB: 1951/07/24   73 y.o. Female  MRN: [id number]  Subjective:    Chief Complaint  Patient presents with   Annual Exam    Tanya Cortez is a 73 y.o. female who presents today for a complete physical exam. She reports consuming a general diet. Stays very active with golf, pickle ball, etc  She generally feels well. She reports sleeping fair, having shoulder issues. Getting dry needling. . She does have additional problems to discuss today. Place on back of her left calf she wants me to look at.    Most recent fall risk assessment:    08/07/2024    8:31 AM  Fall Risk   Falls in the past year? 0  Number falls in past yr: 0  Injury with Fall? 0  Risk for fall due to : No Fall Risks  Follow up Falls evaluation completed     Most recent depression screenings:    08/07/2024    8:31 AM 08/03/2023    8:42 AM  PHQ 2/9 Scores  PHQ - 2 Score 0 0         Patient Care Team: Tanya Dorothyann BIRCH, MD as PCP - General (Family Medicine)   Outpatient Medications Prior to Visit  Medication Sig   alendronate  (FOSAMAX ) 70 MG tablet TAKE 1 TABLET BY MOUTH EVERY FRIDAY   Calcium  Carbonate-Vit D-Min (CALTRATE 600+D PLUS MINERALS) 600-800 MG-UNIT TABS Take 1 tablet by mouth 2 (two) times daily.   Omega-3 1000 MG CAPS Take by mouth.   [DISCONTINUED] atorvastatin  (LIPITOR) 40 MG tablet Take 1 tablet (40 mg total) by mouth daily.   [DISCONTINUED] amoxicillin -clavulanate (AUGMENTIN ) 875-125 MG tablet Take 1 tablet by mouth 2 (two) times daily.   No facility-administered medications prior to visit.    ROS        Objective:     BP 125/68   Pulse 74   Ht 5' 4 (1.626 m)   Wt 134 lb (60.8 kg)   SpO2 100%   BMI 23.00 kg/m     Physical Exam Constitutional:      Appearance: Normal appearance.  HENT:     Head: Normocephalic and atraumatic.     Right Ear: Tympanic membrane, ear canal and external ear normal.     Left Ear:  Tympanic membrane, ear canal and external ear normal.     Nose: Nose normal.     Mouth/Throat:     Pharynx: Oropharynx is clear.  Eyes:     Extraocular Movements: Extraocular movements intact.     Conjunctiva/sclera: Conjunctivae normal.     Pupils: Pupils are equal, round, and reactive to light.  Neck:     Thyroid: No thyromegaly.  Cardiovascular:     Rate and Rhythm: Normal rate and regular rhythm.  Pulmonary:     Effort: Pulmonary effort is normal.     Breath sounds: Normal breath sounds.  Abdominal:     General: Bowel sounds are normal.     Palpations: Abdomen is soft.     Tenderness: There is no abdominal tenderness.  Musculoskeletal:        General: No swelling.     Cervical back: Neck supple.  Skin:    General: Skin is warm and dry.     Comments: Red scaled lesion appro 3 mm on back of left calf  Neurological:     Mental Status: She is oriented to person, place, and time.  Psychiatric:  Mood and Affect: Mood normal.        Behavior: Behavior normal.      No results found for any visits on 08/07/24.      Assessment & Plan:    Routine Health Maintenance and Physical Exam  Immunization History  Administered Date(s) Administered   Fluad Quad(high Dose 65+) 08/14/2019   Fluad Trivalent(High Dose 65+) 08/03/2023   Influenza, High Dose Seasonal PF 09/10/2018, 08/20/2022   Influenza,inj,Quad PF,6+ Mos 12/17/2014, 12/15/2015   Influenza-Unspecified 09/21/2016, 12/10/2017, 10/12/2020, 09/25/2021   Moderna Covid-19 Vaccine Bivalent Booster 84yrs & up 08/20/2022   PFIZER(Purple Top)SARS-COV-2 Vaccination 01/19/2020, 02/09/2020, 10/12/2020, 04/10/2021, 09/25/2021   PNEUMOCOCCAL CONJUGATE-20 02/16/2023   Pfizer(Comirnaty)Fall Seasonal Vaccine 12 years and older 12/31/2022, 09/09/2023   Pneumococcal Conjugate-13 06/12/2015   Pneumococcal Polysaccharide-23 08/10/2016   Respiratory Syncytial Virus Vaccine,Recomb Aduvanted(Arexvy) 08/20/2022   Tdap 05/08/2014,  09/21/2016   Zoster Recombinant(Shingrix) 09/21/2017, 01/06/2018   Zoster, Live 05/08/2014    Health Maintenance  Topic Date Due   COVID-19 Vaccine (9 - 2024-25 season) 03/08/2024   Medicare Annual Wellness (AWV)  05/31/2024   INFLUENZA VACCINE  07/27/2024   Fecal DNA (Cologuard)  09/22/2025   MAMMOGRAM  12/13/2025   DTaP/Tdap/Td (3 - Td or Tdap) 09/21/2026   Pneumococcal Vaccine: 50+ Years  Completed   DEXA SCAN  Completed   Hepatitis C Screening  Completed   Zoster Vaccines- Shingrix  Completed   Hepatitis B Vaccines  Aged Out   HPV VACCINES  Aged Out   Meningococcal B Vaccine  Aged Out    Discussed health benefits of physical activity, and encouraged her to engage in regular exercise appropriate for her age and condition.  Problem List Items Addressed This Visit       Musculoskeletal and Integument   Osteopenia determined by x-ray   Relevant Orders   CMP14+EGFR   CBC with Differential/Platelet   HgB A1c   DG Bone Density     Other   Hyperlipidemia   Relevant Medications   atorvastatin  (LIPITOR) 40 MG tablet   Other Visit Diagnoses       Wellness examination    -  Primary   Relevant Orders   CMP14+EGFR   CBC with Differential/Platelet   HgB A1c     Encounter for screening mammogram for malignant neoplasm of breast       Relevant Orders   MM 3D SCREENING MAMMOGRAM BILATERAL BREAST     Skin lesion       Relevant Orders   Ambulatory referral to Dermatology      No follow-ups on file.     Dorothyann Byars, MD

## 2024-08-07 NOTE — Progress Notes (Signed)
 Complete physical exam  Patient: Tanya Cortez   DOB: [birth year]   73 y.o. Female  MRN: [id number]  Subjective:    Chief Complaint  Patient presents with   Annual Exam    Tanya Cortez is a 73 y.o. female who presents today for a complete physical exam. She reports consuming a general diet. Stays very active with golf, pickle ball, etc  She generally feels well. She reports sleeping fair, having shoulder issues. Getting dry needling. . She does have additional problems to discuss today. Place on back of her left calf she wants me to look at.    Most recent fall risk assessment:    08/07/2024    8:31 AM  Fall Risk   Falls in the past year? 0  Number falls in past yr: 0  Injury with Fall? 0  Risk for fall due to : No Fall Risks  Follow up Falls evaluation completed     Most recent depression screenings:    08/07/2024    8:31 AM 08/03/2023    8:42 AM  PHQ 2/9 Scores  PHQ - 2 Score 0 0         Patient Care Team: Alvan Dorothyann BIRCH, MD as PCP - General (Family Medicine)   Outpatient Medications Prior to Visit  Medication Sig   alendronate  (FOSAMAX ) 70 MG tablet TAKE 1 TABLET BY MOUTH EVERY FRIDAY   Calcium  Carbonate-Vit D-Min (CALTRATE 600+D PLUS MINERALS) 600-800 MG-UNIT TABS Take 1 tablet by mouth 2 (two) times daily.   Omega-3 1000 MG CAPS Take by mouth.   [DISCONTINUED] atorvastatin  (LIPITOR) 40 MG tablet Take 1 tablet (40 mg total) by mouth daily.   [DISCONTINUED] amoxicillin -clavulanate (AUGMENTIN ) 875-125 MG tablet Take 1 tablet by mouth 2 (two) times daily.   No facility-administered medications prior to visit.    ROS        Objective:     BP 125/68   Pulse 74   Ht 5' 4 (1.626 m)   Wt 134 lb (60.8 kg)   SpO2 100%   BMI 23.00 kg/m     Physical Exam Constitutional:      Appearance: Normal appearance.  HENT:     Head: Normocephalic and atraumatic.     Right Ear: Tympanic membrane, ear canal and external ear normal.     Left Ear:  Tympanic membrane, ear canal and external ear normal.     Nose: Nose normal.     Mouth/Throat:     Pharynx: Oropharynx is clear.  Eyes:     Extraocular Movements: Extraocular movements intact.     Conjunctiva/sclera: Conjunctivae normal.     Pupils: Pupils are equal, round, and reactive to light.  Neck:     Thyroid: No thyromegaly.  Cardiovascular:     Rate and Rhythm: Normal rate and regular rhythm.  Pulmonary:     Effort: Pulmonary effort is normal.     Breath sounds: Normal breath sounds.  Abdominal:     General: Bowel sounds are normal.     Palpations: Abdomen is soft.     Tenderness: There is no abdominal tenderness.  Musculoskeletal:        General: No swelling.     Cervical back: Neck supple.  Skin:    General: Skin is warm and dry.     Comments: Red scaled lesion appro 3 mm on back of left calf  Neurological:     Mental Status: She is oriented to person, place, and time.  Psychiatric:  Mood and Affect: Mood normal.        Behavior: Behavior normal.      No results found for any visits on 08/07/24.      Assessment & Plan:    Routine Health Maintenance and Physical Exam  Immunization History  Administered Date(s) Administered   Fluad Quad(high Dose 65+) 08/14/2019   Fluad Trivalent(High Dose 65+) 08/03/2023   Influenza, High Dose Seasonal PF 09/10/2018, 08/20/2022   Influenza,inj,Quad PF,6+ Mos 12/17/2014, 12/15/2015   Influenza-Unspecified 09/21/2016, 12/10/2017, 10/12/2020, 09/25/2021   Moderna Covid-19 Vaccine Bivalent Booster 19yrs & up 08/20/2022   PFIZER(Purple Top)SARS-COV-2 Vaccination 01/19/2020, 02/09/2020, 10/12/2020, 04/10/2021, 09/25/2021   PNEUMOCOCCAL CONJUGATE-20 02/16/2023   Pfizer(Comirnaty)Fall Seasonal Vaccine 12 years and older 12/31/2022, 09/09/2023   Pneumococcal Conjugate-13 06/12/2015   Pneumococcal Polysaccharide-23 08/10/2016   Respiratory Syncytial Virus Vaccine,Recomb Aduvanted(Arexvy) 08/20/2022   Tdap 05/08/2014,  09/21/2016   Zoster Recombinant(Shingrix) 09/21/2017, 01/06/2018   Zoster, Live 05/08/2014    Health Maintenance  Topic Date Due   COVID-19 Vaccine (9 - 2024-25 season) 03/08/2024   Medicare Annual Wellness (AWV)  05/31/2024   INFLUENZA VACCINE  07/27/2024   Fecal DNA (Cologuard)  09/22/2025   MAMMOGRAM  12/13/2025   DTaP/Tdap/Td (3 - Td or Tdap) 09/21/2026   Pneumococcal Vaccine: 50+ Years  Completed   DEXA SCAN  Completed   Hepatitis C Screening  Completed   Zoster Vaccines- Shingrix  Completed   Hepatitis B Vaccines  Aged Out   HPV VACCINES  Aged Out   Meningococcal B Vaccine  Aged Out    Discussed health benefits of physical activity, and encouraged her to engage in regular exercise appropriate for her age and condition.  Problem List Items Addressed This Visit       Musculoskeletal and Integument   Osteopenia determined by x-ray   Relevant Orders   CMP14+EGFR   CBC with Differential/Platelet   HgB A1c   DG Bone Density     Other   Hyperlipidemia   Relevant Medications   atorvastatin  (LIPITOR) 40 MG tablet   Other Visit Diagnoses       Wellness examination    -  Primary   Relevant Orders   CMP14+EGFR   CBC with Differential/Platelet   HgB A1c     Encounter for screening mammogram for malignant neoplasm of breast       Relevant Orders   MM 3D SCREENING MAMMOGRAM BILATERAL BREAST     Skin lesion       Relevant Orders   Ambulatory referral to Dermatology      No follow-ups on file.     Dorothyann Byars, MD

## 2024-08-08 ENCOUNTER — Ambulatory Visit: Payer: Self-pay | Admitting: Family Medicine

## 2024-08-08 LAB — CMP14+EGFR
ALT: 11 IU/L (ref 0–32)
AST: 23 IU/L (ref 0–40)
Albumin: 4.8 g/dL (ref 3.8–4.8)
Alkaline Phosphatase: 107 IU/L (ref 44–121)
BUN/Creatinine Ratio: 13 (ref 12–28)
BUN: 12 mg/dL (ref 8–27)
Bilirubin Total: 0.5 mg/dL (ref 0.0–1.2)
CO2: 23 mmol/L (ref 20–29)
Calcium: 9.7 mg/dL (ref 8.7–10.3)
Chloride: 99 mmol/L (ref 96–106)
Creatinine, Ser: 0.9 mg/dL (ref 0.57–1.00)
Globulin, Total: 2 g/dL (ref 1.5–4.5)
Glucose: 96 mg/dL (ref 70–99)
Potassium: 4.1 mmol/L (ref 3.5–5.2)
Sodium: 141 mmol/L (ref 134–144)
Total Protein: 6.8 g/dL (ref 6.0–8.5)
eGFR: 67 mL/min/1.73 (ref >59)

## 2024-08-08 LAB — CBC WITH DIFFERENTIAL/PLATELET
Basophils Absolute: 0 x10E3/uL (ref 0.0–0.2)
Basos: 0 %
EOS (ABSOLUTE): 0.1 x10E3/uL (ref 0.0–0.4)
Eos: 1 %
Hematocrit: 43.4 % (ref 34.0–46.6)
Hemoglobin: 14 g/dL (ref 11.1–15.9)
Immature Grans (Abs): 0 x10E3/uL (ref 0.0–0.1)
Immature Granulocytes: 0 %
Lymphocytes Absolute: 2 x10E3/uL (ref 0.7–3.1)
Lymphs: 31 %
MCH: 30.8 pg (ref 26.6–33.0)
MCHC: 32.3 g/dL (ref 31.5–35.7)
MCV: 96 fL (ref 79–97)
Monocytes Absolute: 0.3 x10E3/uL (ref 0.1–0.9)
Monocytes: 5 %
Neutrophils Absolute: 4 x10E3/uL (ref 1.4–7.0)
Neutrophils: 63 %
Platelets: 218 x10E3/uL (ref 150–450)
RBC: 4.54 x10E6/uL (ref 3.77–5.28)
RDW: 11.7 % (ref 11.7–15.4)
WBC: 6.4 x10E3/uL (ref 3.4–10.8)

## 2024-08-08 LAB — HEMOGLOBIN A1C
Est. average glucose Bld gHb Est-mCnc: 111 mg/dL
Hgb A1c MFr Bld: 5.5 % (ref 4.8–5.6)

## 2024-08-08 NOTE — Progress Notes (Signed)
 Your lab work is within acceptable range and there are no concerning findings.   ?

## 2024-08-14 ENCOUNTER — Ambulatory Visit (INDEPENDENT_AMBULATORY_CARE_PROVIDER_SITE_OTHER)

## 2024-08-14 VITALS — BP 122/61 | HR 80 | Ht 64.0 in | Wt 135.0 lb

## 2024-08-14 DIAGNOSIS — Z0000 Encounter for general adult medical examination without abnormal findings: Secondary | ICD-10-CM | POA: Diagnosis not present

## 2024-08-14 NOTE — Patient Instructions (Signed)
  [name] , Thank you for taking time to come for your Medicare Wellness Visit. I appreciate your ongoing commitment to your health goals. Please review the following plan we discussed and let me know if I can assist you in the future.   These are the goals we discussed:  Goals       Patient Stated      Patient stated get rid of discomfort in left hip from surgery and doing PT.       Patient Stated (pt-stated)      02/20/2021 AWV Goal: Exercise for General Health  Patient will verbalize understanding of the benefits of increased physical activity: Exercising regularly is important. It will improve your overall fitness, flexibility, and endurance. Regular exercise also will improve your overall health. It can help you control your weight, reduce stress, and improve your bone density. Over the next year, patient will increase physical activity as tolerated with a goal of at least 150 minutes of moderate physical activity per week.  You can tell that you are exercising at a moderate intensity if your heart starts beating faster and you start breathing faster but can still hold a conversation. Moderate-intensity exercise ideas include: Walking 1 mile (1.6 km) in about 15 minutes Biking Hiking Golfing Dancing Water  aerobics Patient will verbalize understanding of everyday activities that increase physical activity by providing examples like the following: Yard work, such as: Insurance underwriter Gardening Washing windows or floors Patient will be able to explain general safety guidelines for exercising:  Before you start a new exercise program, talk with your health care provider. Do not exercise so much that you hurt yourself, feel dizzy, or get very short of breath. Wear comfortable clothes and wear shoes with good support. Drink plenty of water  while you exercise to prevent dehydration or heat  stroke. Work out until your breathing and your heartbeat get faster.       Patient Stated (pt-stated)      Still working with physical therapy related to her hip. Would like to be able to get through all of it.      Patient Stated (pt-stated)      Patient stated that she would like to continue to maintain current healthy style.      Patient Stated      Patient states she would like to maintain her current healthy lifestyle.         This is a list of the screening recommended for you and due dates:  Health Maintenance  Topic Date Due   COVID-19 Vaccine (9 - 2024-25 season) 03/08/2024   Flu Shot  07/27/2024   Medicare Annual Wellness Visit  08/14/2025   Cologuard (Stool DNA test)  09/22/2025   Mammogram  12/13/2025   DTaP/Tdap/Td vaccine (3 - Td or Tdap) 09/21/2026   Pneumococcal Vaccine for age over 21  Completed   DEXA scan (bone density measurement)  Completed   Hepatitis C Screening  Completed   Zoster (Shingles) Vaccine  Completed   HPV Vaccine  Aged Out   Meningitis B Vaccine  Aged Out

## 2024-08-14 NOTE — Progress Notes (Signed)
 Subjective:   Tanya Cortez is a 73 y.o. female who presents for Medicare Annual (Subsequent) preventive examination.  Visit Complete: In person  Patient Medicare AWV questionnaire was completed by the patient on 08/06/2024; I have confirmed that all information answered by patient is correct and no changes since this date.  Cardiac Risk Factors include: advanced age (>48men, >86 women);family history of premature cardiovascular disease;dyslipidemia;hypertension     Objective:    Today's Vitals   08/14/24 0844  BP: 122/61  Pulse: 80  SpO2: 100%  Weight: 135 lb (61.2 kg)  Height: 5' 4 (1.626 m)   Body mass index is 23.17 kg/m.     08/14/2024    8:56 AM 06/01/2023    8:06 AM 05/17/2022   10:12 AM 02/20/2021    8:16 AM 02/11/2020    8:58 AM 09/21/2019   10:22 AM 09/18/2019    9:34 AM  Advanced Directives  Does Patient Have a Medical Advance Directive? Yes Yes Yes Yes Yes Yes Yes  Type of Estate agent of [place];Living will Living will Living will Healthcare Power of [place];Living will Healthcare Power of [place];Living will Healthcare Power of [place];Living will Healthcare Power of [place];Living will  Does patient want to make changes to medical advance directive? No - Patient declined No - Patient declined No - Patient declined No - Patient declined No - Patient declined No - Patient declined No - Patient declined  Copy of Healthcare Power of Attorney in Chart?     No - copy requested No - copy requested No - copy requested    Current Medications (verified) Outpatient Encounter Medications as of 08/14/2024  Medication Sig   alendronate  (FOSAMAX ) 70 MG tablet TAKE 1 TABLET BY MOUTH EVERY FRIDAY   atorvastatin  (LIPITOR) 40 MG tablet Take 1 tablet (40 mg total) by mouth daily.   Calcium  Carbonate-Vit D-Min (CALTRATE 600+D PLUS MINERALS) 600-800 MG-UNIT TABS Take 1 tablet by mouth 2 (two) times daily.   Omega-3 1000 MG CAPS Take by mouth.   No  facility-administered encounter medications on file as of 08/14/2024.    Allergies (verified) Patient has no known allergies.   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   Procedure: LEFT TOTAL HIP ARTHROPLASTY ANTERIOR APPROACH;  Surgeon: Vernetta Lonni GRADE, MD;  Location: [facility];  Service: Orthopedics;  Laterality: Left;   TOTAL HIP ARTHROPLASTY Right 09/21/2019   Procedure: RIGHT TOTAL HIP ARTHROPLASTY ANTERIOR APPROACH;  Surgeon: Vernetta Lonni GRADE, MD;  Location: [facility];  Service: Orthopedics;  Laterality: Right;   Family History  Problem Relation Age of Onset   Skin cancer Mother        squamous cell    Hypertension Mother    Breast cancer Mother    COPD Mother    Cancer Mother    Hearing loss Mother    Heart disease Mother    Heart attack Maternal Grandfather    Stroke Maternal Grandfather    Heart disease Maternal Grandfather    Heart attack Father  died 21 yo   Heart disease Father    Vision loss Father    Hypertension Sister    Cancer Sister    Heart disease Sister    Social History   Socioeconomic History   Marital status: Single    Spouse name: Not on file   Number of children: 0   Years of education: 16   Highest education level: Doctorate  Occupational History   Occupation: high school principal    Comment: [employer]   Occupation: Retired  Tobacco Use   Smoking status: Never   Smokeless tobacco: Never  Advertising account planner   Vaping status: Never Used  Substance and Sexual Activity   Alcohol use: No   Drug use: No   Sexual activity: Never   Other Topics Concern   Not on file  Social History Narrative   Formerly HS principal at [employer].  She has a degree in education specialty.  Lives with Tanya Cortez.  Plays golf 2-3 times a week and works out with a trainer 3 times a week for 30 mins.   Social Drivers of Corporate investment banker Strain: Low Risk  (08/14/2024)   Overall Financial Resource Strain (CARDIA)    Difficulty of Paying Living Expenses: Not hard at all  Food Insecurity: No Food Insecurity (08/14/2024)   Hunger Vital Sign    Worried About Running Out of Food in the Last Year: Never true    Ran Out of Food in the Last Year: Never true  Transportation Needs: No Transportation Needs (08/14/2024)   PRAPARE - Administrator, Civil Service (Medical): No    Lack of Transportation (Non-Medical): No  Physical Activity: Sufficiently Active (08/14/2024)   Exercise Vital Sign    Days of Exercise per Week: 7 days    Minutes of Exercise per Session: 40 min  Stress: No Stress Concern Present (08/14/2024)   [employer] of Occupational Health - Occupational Stress Questionnaire    Feeling of Stress: Not at all  Social Connections: Moderately Integrated (08/14/2024)   Social Connection and Isolation Panel    Frequency of Communication with Friends and Family: More than three times a week    Frequency of Social Gatherings with Friends and Family: More than three times a week    Attends Religious Services: More than 4 times per year    Active Member of [employer] or Organizations: Yes    Attends Engineer, structural: More than 4 times per year    Marital Status: Never married    Tobacco Counseling Counseling given: Not Answered   Clinical Intake:  Pre-visit preparation completed: Yes  Pain : No/denies pain     BMI - recorded: 23.17 Nutritional Status: BMI of 19-24  Normal Nutritional Risks: None  How often do you need to have someone help you when you read instructions, pamphlets, or other  written materials from your doctor or pharmacy?: 1 - Never What is the last grade level you completed in school?: 20  Interpreter Needed?: No      Activities of Daily Living    08/14/2024    8:52 AM 08/13/2024    8:40 AM  In your present state of health, do you have any difficulty performing the following activities:  Hearing? 0 0  Vision? 0 0  Difficulty concentrating or making decisions? 0 0  Walking or climbing stairs? 0 0  Dressing or bathing? 0 0  Doing errands, shopping? 0 0  Preparing Food and eating ? N  N  Using the Toilet? N N  In the past six months, have you accidently leaked urine? N N  Do you have problems with loss of bowel control? N N  Managing your Medications? N N  Managing your Finances? N N  Housekeeping or managing your Housekeeping? N N    Patient Care Team: Alvan Dorothyann BIRCH, MD as PCP - General (Family Medicine) Vernetta Lonni GRADE, MD as Consulting Physician (Orthopedic Surgery)  Indicate any recent Medical Services you may have received from other than [facility] providers in the past year (date may be approximate).     Assessment:   This is a routine wellness examination for Tanya Cortez.  Hearing/Vision screen No results found.   Goals Addressed             This Visit's Progress    Patient Stated       Patient states she would like to maintain her current healthy lifestyle.        Depression Screen    08/14/2024    8:55 AM 08/07/2024    8:31 AM 08/03/2023    8:42 AM 06/01/2023    8:06 AM 02/16/2023    8:15 AM 08/12/2022    8:10 AM 05/17/2022   10:16 AM  PHQ 2/9 Scores  PHQ - 2 Score 0 0 0 0 0 0 0    Fall Risk    08/14/2024    8:56 AM 08/13/2024    8:40 AM 08/07/2024    8:31 AM 08/03/2023    8:42 AM 06/01/2023    8:06 AM  Fall Risk   Falls in the past year? 0 0 0 0 0  Number falls in past yr: 0  0 0 0  Injury with Fall? 0 0 0 0 0  Risk for fall due to : No Fall Risks  No Fall Risks No Fall Risks No Fall Risks  Follow up Falls  evaluation completed  Falls evaluation completed Falls evaluation completed Falls evaluation completed    MEDICARE RISK AT HOME: Medicare Risk at Home Any stairs in or around the home?: Yes If so, are there any without handrails?: No Home free of loose throw rugs in walkways, pet beds, electrical cords, etc?: Yes Adequate lighting in your home to reduce risk of falls?: Yes Life alert?: Yes Use of a cane, walker or w/c?: No Grab bars in the bathroom?: No Shower chair or bench in shower?: No Elevated toilet seat or a handicapped toilet?: No  TIMED UP AND GO:  Was the test performed?  Yes  Length of time to ambulate 10 feet: 8 sec Gait steady and fast without use of assistive device    Cognitive Function:        08/14/2024    8:56 AM 08/07/2024    8:31 AM 06/01/2023    8:09 AM 05/17/2022   10:17 AM 02/20/2021    8:25 AM  6CIT Screen  What Year? 0 points 0 points 0 points 0 points 0 points  What month? 0 points 0 points 0 points 0 points 0 points  What time? 0 points 0 points 0 points 0 points 0 points  Count back from 20 0 points 0 points 0 points 0 points 0 points  Months in reverse 0 points 0 points 0 points 0 points 2 points  Repeat phrase 0 points 0 points 0 points 0 points 0 points  Total Score 0 points 0 points 0 points 0 points 2 points    Immunizations Immunization History  Administered Date(s) Administered   Fluad Quad(high Dose 65+) 08/14/2019   Fluad Trivalent(High Dose 65+) 08/03/2023   Influenza, High Dose Seasonal PF 09/10/2018, 08/20/2022   Influenza,inj,Quad PF,6+ Mos 12/17/2014, 12/15/2015   Influenza-Unspecified 09/21/2016, 12/10/2017, 10/12/2020, 09/25/2021   Moderna Covid-19 Vaccine Bivalent Booster 2yrs & up 08/20/2022   PFIZER(Purple Top)SARS-COV-2 Vaccination 01/19/2020, 02/09/2020, 10/12/2020, 04/10/2021, 09/25/2021   PNEUMOCOCCAL CONJUGATE-20 02/16/2023   Pfizer(Comirnaty)Fall Seasonal Vaccine 12 years and older 12/31/2022, 09/09/2023    Pneumococcal Conjugate-13 06/12/2015   Pneumococcal Polysaccharide-23 08/10/2016   Respiratory Syncytial Virus Vaccine,Recomb Aduvanted(Arexvy) 08/20/2022   Tdap 05/08/2014, 09/21/2016   Zoster Recombinant(Shingrix) 09/21/2017, 01/06/2018   Zoster, Live 05/08/2014    TDAP status: Up to date  Flu Vaccine status: Up to date  Pneumococcal vaccine status: Up to date  Covid-19 vaccine status: Completed vaccines  Qualifies for Shingles Vaccine? Yes   Zostavax completed Yes   Shingrix Completed?: Yes  Screening Tests Health Maintenance  Topic Date Due   COVID-19 Vaccine (9 - 2024-25 season) 03/08/2024   INFLUENZA VACCINE  07/27/2024   Medicare Annual Wellness (AWV)  08/14/2025   Fecal DNA (Cologuard)  09/22/2025   MAMMOGRAM  12/13/2025   DTaP/Tdap/Td (3 - Td or Tdap) 09/21/2026   Pneumococcal Vaccine: 50+ Years  Completed   DEXA SCAN  Completed   Hepatitis C Screening  Completed   Zoster Vaccines- Shingrix  Completed   HPV VACCINES  Aged Out   Meningococcal B Vaccine  Aged Out    Health Maintenance  Health Maintenance Due  Topic Date Due   COVID-19 Vaccine (9 - 2024-25 season) 03/08/2024   INFLUENZA VACCINE  07/27/2024    Colorectal cancer screening: Type of screening: Cologuard. Completed 09/22/2022. Repeat every 3 years  Mammogram status: Completed 12/14/2023. Repeat every year  Bone Density status: Completed 11/24/2022. Results reflect: Bone density results: OSTEOPENIA. Repeat every 2 years.  Lung Cancer Screening: (Low Dose CT Chest recommended if Age 50-80 years, 20 pack-year currently smoking OR have quit w/in 15years.) does not qualify.   Lung Cancer Screening Referral: n/a  Additional Screening:  Hepatitis C Screening: does qualify; Completed 08/02/2017  Vision Screening: Recommended annual ophthalmology exams for early detection of glaucoma and other disorders of the eye. Is the patient up to date with their annual eye exam?  Yes  Who is the provider or  what is the name of the office in which the patient attends annual eye exams? Dr Oneita If pt is not established with a provider, would they like to be referred to a provider to establish care? N/a.   Dental Screening: Recommended annual dental exams for proper oral hygiene   Community Resource Referral / Chronic Care Management: CRR required this visit?  No   CCM required this visit?  No     Plan:     I have personally reviewed and noted the following in the patient's chart:   Medical and social history Use of alcohol, tobacco or illicit drugs  Current medications and supplements including opioid prescriptions. Patient is not currently taking opioid prescriptions. Functional ability and status Nutritional status Physical activity Advanced directives List of other physicians Hospitalizations, surgeries, and ER visits in previous 12 months. None Vitals Screenings to include cognitive, depression, and falls Referrals and appointments  In addition, I have reviewed and discussed with patient certain preventive protocols, quality metrics, and best practice recommendations. A written personalized care plan for preventive services as well as general preventive health recommendations were provided to patient.  Bonny Jon Mayor, CMA   08/14/2024   After Visit Summary: (In Person-Printed) AVS printed and given to the patient  Nurse Notes:   Tanya Cortez is a 73 y.o. female patient of Metheney, Dorothyann BIRCH, MD who had a Medicare Annual Wellness Visit today. Kinesha is Retired and lives with an adult companion. She does not have any children. She reports that she is socially active and does interact with friends/family regularly. She is markedly physically active and enjoys staying active.

## 2024-08-29 DIAGNOSIS — L814 Other melanin hyperpigmentation: Secondary | ICD-10-CM | POA: Diagnosis not present

## 2024-08-29 DIAGNOSIS — D225 Melanocytic nevi of trunk: Secondary | ICD-10-CM | POA: Diagnosis not present

## 2024-08-29 DIAGNOSIS — L821 Other seborrheic keratosis: Secondary | ICD-10-CM | POA: Diagnosis not present

## 2024-08-29 DIAGNOSIS — L739 Follicular disorder, unspecified: Secondary | ICD-10-CM | POA: Diagnosis not present

## 2024-08-29 DIAGNOSIS — D235 Other benign neoplasm of skin of trunk: Secondary | ICD-10-CM | POA: Diagnosis not present

## 2024-08-29 DIAGNOSIS — D2261 Melanocytic nevi of right upper limb, including shoulder: Secondary | ICD-10-CM | POA: Diagnosis not present

## 2024-08-29 DIAGNOSIS — L570 Actinic keratosis: Secondary | ICD-10-CM | POA: Diagnosis not present

## 2024-08-29 DIAGNOSIS — L579 Skin changes due to chronic exposure to nonionizing radiation, unspecified: Secondary | ICD-10-CM | POA: Diagnosis not present

## 2024-08-29 DIAGNOSIS — D485 Neoplasm of uncertain behavior of skin: Secondary | ICD-10-CM | POA: Diagnosis not present

## 2024-09-14 ENCOUNTER — Other Ambulatory Visit: Payer: Self-pay | Admitting: Family Medicine

## 2024-09-26 ENCOUNTER — Encounter: Payer: Self-pay | Admitting: Family Medicine

## 2024-09-26 NOTE — Telephone Encounter (Signed)
 Pts chart updated with pt reported vaccines. Annabella Rigg, CMA

## 2024-10-04 DIAGNOSIS — L905 Scar conditions and fibrosis of skin: Secondary | ICD-10-CM | POA: Diagnosis not present

## 2024-10-04 DIAGNOSIS — D485 Neoplasm of uncertain behavior of skin: Secondary | ICD-10-CM | POA: Diagnosis not present

## 2024-12-17 ENCOUNTER — Other Ambulatory Visit (HOSPITAL_BASED_OUTPATIENT_CLINIC_OR_DEPARTMENT_OTHER)

## 2024-12-17 ENCOUNTER — Ambulatory Visit (HOSPITAL_BASED_OUTPATIENT_CLINIC_OR_DEPARTMENT_OTHER)
Admission: RE | Admit: 2024-12-17 | Discharge: 2024-12-17 | Disposition: A | Discharge status: Home / Self Care | Source: Ambulatory Visit | Attending: Family Medicine | Admitting: Family Medicine

## 2024-12-17 ENCOUNTER — Encounter (HOSPITAL_BASED_OUTPATIENT_CLINIC_OR_DEPARTMENT_OTHER): Payer: Self-pay

## 2024-12-17 DIAGNOSIS — Z1231 Encounter for screening mammogram for malignant neoplasm of breast: Secondary | ICD-10-CM | POA: Insufficient documentation

## 2024-12-17 DIAGNOSIS — M85832 Other specified disorders of bone density and structure, left forearm: Secondary | ICD-10-CM | POA: Diagnosis not present

## 2024-12-17 DIAGNOSIS — M8580 Other specified disorders of bone density and structure, unspecified site: Secondary | ICD-10-CM | POA: Insufficient documentation

## 2024-12-21 NOTE — Progress Notes (Signed)
 Hi Trish, forearm bone density show T scor eof -1.4 in the mildly thin range. Keep up the exercise, adequate calcium  and vitamin D  intake and the fosamax .

## 2024-12-24 NOTE — Progress Notes (Signed)
 Please call patient. Normal mammogram.  Repeat in 1 year.

## 2025-02-06 ENCOUNTER — Ambulatory Visit: Admitting: Family Medicine

## 2025-08-15 ENCOUNTER — Ambulatory Visit

## 2025-08-21 ENCOUNTER — Ambulatory Visit
